# Patient Record
Sex: Female | Born: 1990 | Hispanic: Yes | Smoking: Never smoker
Health system: Southern US, Community
[De-identification: ages and names within clinical notes are randomized; demographics above are authoritative.]

## PROBLEM LIST (undated history)

## (undated) ENCOUNTER — Inpatient Hospital Stay (HOSPITAL_COMMUNITY): Payer: Self-pay

## (undated) ENCOUNTER — Inpatient Hospital Stay (HOSPITAL_COMMUNITY)

## (undated) DIAGNOSIS — R0989 Other specified symptoms and signs involving the circulatory and respiratory systems: Secondary | ICD-10-CM

## (undated) DIAGNOSIS — R112 Nausea with vomiting, unspecified: Secondary | ICD-10-CM

## (undated) DIAGNOSIS — IMO0002 Reserved for concepts with insufficient information to code with codable children: Secondary | ICD-10-CM

## (undated) DIAGNOSIS — Z9889 Other specified postprocedural states: Secondary | ICD-10-CM

## (undated) DIAGNOSIS — N390 Urinary tract infection, site not specified: Secondary | ICD-10-CM

## (undated) DIAGNOSIS — Z8781 Personal history of (healed) traumatic fracture: Secondary | ICD-10-CM

## (undated) DIAGNOSIS — H9190 Unspecified hearing loss, unspecified ear: Secondary | ICD-10-CM

## (undated) DIAGNOSIS — O039 Complete or unspecified spontaneous abortion without complication: Secondary | ICD-10-CM

## (undated) DIAGNOSIS — R229 Localized swelling, mass and lump, unspecified: Secondary | ICD-10-CM

## (undated) HISTORY — PX: TONSILLECTOMY: SUR1361

## (undated) HISTORY — PX: COCHLEAR IMPLANT: SUR684

## (undated) HISTORY — DX: Other specified symptoms and signs involving the circulatory and respiratory systems: R09.89

## (undated) HISTORY — PX: RHINOPLASTY: SUR1284

---
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
## 2011-09-30 ENCOUNTER — Encounter (HOSPITAL_COMMUNITY): Payer: Self-pay | Admitting: Emergency Medicine

## 2011-09-30 ENCOUNTER — Emergency Department (HOSPITAL_COMMUNITY)
Admission: EM | Admit: 2011-09-30 | Discharge: 2011-09-30 | Disposition: A | Discharge status: Home / Self Care | Attending: Emergency Medicine | Admitting: Emergency Medicine

## 2011-09-30 DIAGNOSIS — L0291 Cutaneous abscess, unspecified: Secondary | ICD-10-CM

## 2011-09-30 DIAGNOSIS — H9190 Unspecified hearing loss, unspecified ear: Secondary | ICD-10-CM | POA: Insufficient documentation

## 2011-09-30 DIAGNOSIS — L0390 Cellulitis, unspecified: Secondary | ICD-10-CM

## 2011-09-30 DIAGNOSIS — L02219 Cutaneous abscess of trunk, unspecified: Secondary | ICD-10-CM | POA: Insufficient documentation

## 2011-09-30 DIAGNOSIS — R109 Unspecified abdominal pain: Secondary | ICD-10-CM | POA: Insufficient documentation

## 2011-09-30 HISTORY — DX: Unspecified hearing loss, unspecified ear: H91.90

## 2011-09-30 MED ORDER — IBUPROFEN 200 MG PO TABS
400.0000 mg | ORAL_TABLET | Freq: Once | ORAL | Status: AC
  Administered 2011-09-30: 400 mg via ORAL
  Filled 2011-09-30: qty 2

## 2011-09-30 MED ORDER — BUPIVACAINE HCL (PF) 0.5 % IJ SOLN
10.0000 mL | Freq: Once | INTRAMUSCULAR | Status: AC
  Administered 2011-09-30: 10 mL
  Filled 2011-09-30: qty 30

## 2011-09-30 MED ORDER — SULFAMETHOXAZOLE-TMP DS 800-160 MG PO TABS
1.0000 | ORAL_TABLET | Freq: Once | ORAL | Status: AC
  Administered 2011-09-30: 1 via ORAL
  Filled 2011-09-30: qty 1

## 2011-09-30 MED ORDER — LORAZEPAM 1 MG PO TABS
1.0000 mg | ORAL_TABLET | Freq: Once | ORAL | Status: AC
Start: 2011-09-30 — End: 2011-09-30
  Administered 2011-09-30: 1 mg via ORAL
  Filled 2011-09-30: qty 1

## 2011-09-30 MED ORDER — SULFAMETHOXAZOLE-TRIMETHOPRIM 800-160 MG PO TABS: 1.0000 | ORAL_TABLET | Freq: Two times a day (BID) | ORAL | Status: AC

## 2011-09-30 MED ORDER — OXYCODONE-ACETAMINOPHEN 5-325 MG PO TABS
2.0000 | ORAL_TABLET | Freq: Once | ORAL | Status: AC
  Administered 2011-09-30: 2 via ORAL
  Filled 2011-09-30: qty 2

## 2011-09-30 NOTE — ED Notes (Signed)
Per pt, states has pimple on left flank area, tried to pop it and it got bigger-states pain radiating to left goin-cant bear weight on left side

## 2011-09-30 NOTE — Discharge Instructions (Signed)
Abscess An abscess (boil or furuncle) is an infected area that contains a collection of pus.  SYMPTOMS Signs and symptoms of an abscess include pain, tenderness, redness, or hardness. You may feel a moveable soft area under your skin. An abscess can occur anywhere in the body.  TREATMENT  A surgical cut (incision) may be made over your abscess to drain the pus. Gauze may be packed into the space or a drain may be looped through the abscess cavity (pocket). This provides a drain that will allow the cavity to heal from the inside outwards. The abscess may be painful for a few days, but should feel much better if it was drained.  Your abscess, if seen early, may not have localized and may not have been drained. If not, another appointment may be required if it does not get better on its own or with medications. HOME CARE INSTRUCTIONS   Only take over-the-counter or prescription medicines for pain, discomfort, or fever as directed by your caregiver.   Take your antibiotics as directed if they were prescribed. Finish them even if you start to feel better.   Keep the skin and clothes clean around your abscess.   If the abscess was drained, you will need to use gauze dressing to collect any draining pus. Dressings will typically need to be changed 3 or more times a day.   The infection may spread by skin contact with others. Avoid skin contact as much as possible.   Practice good hygiene. This includes regular hand washing, cover any draining skin lesions, and do not share personal care items.   If you participate in sports, do not share athletic equipment, towels, whirlpools, or personal care items. Shower after every practice or tournament.   If a draining area cannot be adequately covered:   Do not participate in sports.   Children should not participate in day care until the wound has healed or drainage stops.   If your caregiver has given you a follow-up appointment, it is very important  to keep that appointment. Not keeping the appointment could result in a much worse infection, chronic or permanent injury, pain, and disability. If there is any problem keeping the appointment, you must call back to this facility for assistance.  SEEK MEDICAL CARE IF:   You develop increased pain, swelling, redness, drainage, or bleeding in the wound site.   You develop signs of generalized infection including muscle aches, chills, fever, or a general ill feeling.   You have an oral temperature above 102 F (38.9 C).  MAKE SURE YOU:   Understand these instructions.   Will watch your condition.   Will get help right away if you are not doing well or get worse.  Document Released: 12/30/2004 Document Revised: 03/11/2011 Document Reviewed: 10/24/2007 [facility] Patient Information 2012 [place], [place].Cellulitis Cellulitis is an infection of the tissue under the skin. The infected area is usually red and tender. This is caused by germs. These germs enter the body through cuts or sores. This usually happens in the arms or lower legs. HOME CARE   Take your medicine as told. Finish it even if you start to feel better.   If the infection is on the arm or leg, keep it raised (elevated).   Use a warm cloth on the infected area several times a day.   See your doctor for a follow-up visit as told.  GET HELP RIGHT AWAY IF:   You are tired or confused.   You  throw up (vomit).   You have watery poop (diarrhea).   You feel ill and have muscle aches.   You have a fever.  MAKE SURE YOU:   Understand these instructions.   Will watch your condition.   Will get help right away if you are not doing well or get worse.  Document Released: 09/08/2007 Document Revised: 03/11/2011 Document Reviewed: 02/21/2009 [facility] Patient Information 2012 [place], [place].Cellulitis Cellulitis is an infection of the tissue under the skin. The infected area is usually red and tender. This is caused by germs.  These germs enter the body through cuts or sores. This usually happens in the arms or lower legs. HOME CARE   Take your medicine as told. Finish it even if you start to feel better.   If the infection is on the arm or leg, keep it raised (elevated).   Use a warm cloth on the infected area several times a day.   See your doctor for a follow-up visit as told.  GET HELP RIGHT AWAY IF:   You are tired or confused.   You throw up (vomit).   You have watery poop (diarrhea).   You feel ill and have muscle aches.   You have a fever.  MAKE SURE YOU:   Understand these instructions.   Will watch your condition.   Will get help right away if you are not doing well or get worse.  Document Released: 09/08/2007 Document Revised: 03/11/2011 Document Reviewed: 02/21/2009 [facility] Patient Information 2012 [place], [place].

## 2011-09-30 NOTE — ED Notes (Signed)
Patient with abcess to left side x 1 week. Redness with swelling and drainage noted. Patient tried to pop herself 1 week ago w/o relief

## 2011-09-30 NOTE — ED Provider Notes (Signed)
History    21yf with abscess. L flank area. Had small pimple in area several days ago which she popped. Since then has become increasingly bigger and more painful. Some pain in L groin area as well. No fever or chills. No n/v. No urinary complaints. No hx of recurrent skin infections. Deaf, otherwise no significant PMHx.   CSN: [id number]  Arrival date & time 09/30/11  1701   First MD Initiated Contact with Patient 09/30/11 1906      Chief Complaint  Patient presents with  . Abscess    (Consider location/radiation/quality/duration/timing/severity/associated sxs/prior treatment) HPI  Past Medical History  Diagnosis Date  . Deaf     Past Surgical History  Procedure Date  . Tonsillectomy   . Cochlear implant     No family history on file.  History  Substance Use Topics  . Smoking status: Never Smoker   . Smokeless tobacco: Not on file  . Alcohol Use: No    OB History    Grav Para Term Preterm Abortions TAB SAB Ect Mult Living                  Review of Systems   Review of symptoms negative unless otherwise noted in HPI.   Allergies  Review of patient's allergies indicates no known allergies.  Home Medications   Current Outpatient Rx  Name Route Sig Dispense Refill  . ACETAMINOPHEN 500 MG PO TABS Oral Take 1,000 mg by mouth every 6 (six) hours as needed. For pain.    Marland Kitchen AMOXICILLIN 500 MG PO CAPS Oral Take 1,000 mg by mouth once.    . AMOXICILLIN 500 MG PO CAPS Oral Take 500 mg by mouth once.    . IBUPROFEN 200 MG PO TABS Oral Take 600 mg by mouth every 8 (eight) hours as needed. For pain.      BP 124/80  Pulse 79  Temp 97.8 F (36.6 C) (Oral)  Resp 18  Ht 5\' 4"  (1.626 m)  Wt 153 lb (69.4 kg)  BMI 26.26 kg/m2  SpO2 100%  LMP 09/16/2011  Physical Exam  Nursing note and vitals reviewed. Constitutional: She appears well-developed and well-nourished. No distress.  HENT:  Head: Normocephalic and atraumatic.  Eyes: Conjunctivae are normal. Right eye  exhibits no discharge. Left eye exhibits no discharge.  Neck: Neck supple.  Cardiovascular: Normal rate, regular rhythm and normal heart sounds.  Exam reveals no gallop and no friction rub.   No murmur heard. Pulmonary/Chest: Effort normal and breath sounds normal. No respiratory distress.  Abdominal: Soft. She exhibits no distension. There is no tenderness.  Musculoskeletal: She exhibits no edema and no tenderness.  Neurological: She is alert.  Skin: Skin is warm and dry.       Lesion L flank consistent with abscess. Mild surrounding cellulitis. Fluid collection confirmed on bedside US. Small L inguinal lymph nodes.  Psychiatric: She has a normal mood and affect. Her behavior is normal. Thought content normal.    ED Course  Procedures (including critical care time)  INCISION AND DRAINAGE Performed by: Raeford Razor Consent: Verbal consent obtained. Risks and benefits: risks, benefits and alternatives were discussed Type: abscess  Body area: L flank  Anesthesia: local infiltration  Local anesthetic: Bupivacaine 0.5% without  epinephrine  Anesthetic total: 3 ml  Complexity: complex Blunt dissection to break up loculations  Drainage: purulent  Drainage amount: moderate amount  Packing material: 1/4 in iodoform gauze  Patient tolerance: Patient tolerated the procedure well with no immediate  complications.    Labs Reviewed - No data to display No results found.   1. Abscess   2. Cellulitis       MDM  21yF with abscess L flank with mild surrounding cellulitis. Not systemically ill. No hx of diabetes or immunosuppression. Abscess I&D. Antibiotics for mild cellulitis. Return precautions were discussed. Continued wound care was discussed with patient and her mother. Outpatient followup otherwise.        Raeford Razor, MD 10/04/11 [phone]

## 2012-03-17 LAB — OB RESULTS CONSOLE GC/CHLAMYDIA: Gonorrhea: NEGATIVE

## 2012-03-17 LAB — OB RESULTS CONSOLE HEPATITIS B SURFACE ANTIGEN: Hepatitis B Surface Ag: NEGATIVE

## 2012-03-17 LAB — OB RESULTS CONSOLE HIV ANTIBODY (ROUTINE TESTING): HIV: NONREACTIVE

## 2012-04-05 NOTE — L&D Delivery Note (Signed)
Delivery Note At 7:19 AM a viable and healthy female was delivered via Vaginal, Spontaneous Delivery (Presentation: Right Occiput Anterior).  APGAR: 8, 9; weight .   Placenta status: Intact, Spontaneous.  Cord: 3 vessels with the following complications: None.  Cord pH: n/a  Anesthesia: Epidural  Episiotomy: None Lacerations: 1st degree Suture Repair: 3.0 monocryl Est. Blood Loss (mL): 300  Mom to postpartum.  Baby to nursery-stable.  [facility] 09/23/2012, 7:50 AM

## 2012-04-14 LAB — AFP, QUAD SCREEN: Quad Risk Down Syndrome: NEGATIVE

## 2012-04-14 LAB — CULTURE, OB URINE: Urine Culture, OB: NO GROWTH

## 2012-04-16 ENCOUNTER — Emergency Department (HOSPITAL_COMMUNITY)
Admission: EM | Admit: 2012-04-16 | Discharge: 2012-04-16 | Disposition: A | Discharge status: Home / Self Care | Payer: Medicaid Other | Attending: Emergency Medicine | Admitting: Emergency Medicine

## 2012-04-16 ENCOUNTER — Encounter (HOSPITAL_COMMUNITY): Payer: Self-pay | Admitting: Emergency Medicine

## 2012-04-16 DIAGNOSIS — O9989 Other specified diseases and conditions complicating pregnancy, childbirth and the puerperium: Secondary | ICD-10-CM | POA: Insufficient documentation

## 2012-04-16 DIAGNOSIS — Z8669 Personal history of other diseases of the nervous system and sense organs: Secondary | ICD-10-CM | POA: Insufficient documentation

## 2012-04-16 DIAGNOSIS — R51 Headache: Secondary | ICD-10-CM | POA: Insufficient documentation

## 2012-04-16 DIAGNOSIS — Z79899 Other long term (current) drug therapy: Secondary | ICD-10-CM | POA: Insufficient documentation

## 2012-04-16 NOTE — ED Provider Notes (Signed)
History     CSN: [id number]  Arrival date & time 04/16/12  1149   First MD Initiated Contact with Patient 04/16/12 1213      Chief Complaint  Patient presents with  . Headache    (Consider location/radiation/quality/duration/timing/severity/associated sxs/prior treatment) Patient is a 22 y.o. female presenting with headaches. The history is provided by the patient.  Headache  This is a new problem. The current episode started more than 2 days ago. The problem occurs constantly. The headache is associated with bright light (She states being in class and walking also make the headache worse. ). Pertinent negatives include no fever, no nausea and no vomiting. Associated symptoms comments: Parietal and frontal headache off and on for one week. She reports taking Tylenol without relief. No nausea or vomiting. She is 5 months pregnant but states no abdominal pain, vaginal bleeding or vomiting. She receives regular prenatal care and has no concerns about the pregnancy today. She does not have a history of headache. It begins gradually, and prompted evaluation because is has persisted for one week. .    Past Medical History  Diagnosis Date  . Deaf     Past Surgical History  Procedure Date  . Tonsillectomy   . Cochlear implant     History reviewed. No pertinent family history.  History  Substance Use Topics  . Smoking status: Never Smoker   . Smokeless tobacco: Not on file  . Alcohol Use: No    OB History    Grav Para Term Preterm Abortions TAB SAB Ect Mult Living   1               Review of Systems  Constitutional: Negative for fever.  HENT: Negative for congestion, sore throat and sinus pressure.   Gastrointestinal: Negative for nausea, vomiting and abdominal pain.  Genitourinary: Negative for vaginal bleeding.  Neurological: Positive for headaches.  Psychiatric/Behavioral: Negative for confusion.    Allergies  Review of patient's allergies indicates no known  allergies.  Home Medications   Current Outpatient Rx  Name  Route  Sig  Dispense  Refill  . ACETAMINOPHEN 500 MG PO TABS   Oral   Take 1,000 mg by mouth every 6 (six) hours as needed. For pain.         Marland Kitchen PRENATAL 27-0.8 MG PO TABS   Oral   Take 1 tablet by mouth daily.           BP 107/66  Pulse 84  Temp 98 F (36.7 C) (Oral)  Resp 16  SpO2 99%  LMP 12/09/2011  Physical Exam  Constitutional: She is oriented to person, place, and time. She appears well-developed and well-nourished.  HENT:  Head: Normocephalic.       Ethmoid sinus tenderness to palpation that reproduces headache.  Eyes: Pupils are equal, round, and reactive to light.  Neck: Normal range of motion. Neck supple.  Cardiovascular: Normal rate and regular rhythm.   Pulmonary/Chest: Effort normal and breath sounds normal.  Abdominal: Soft. Bowel sounds are normal. There is no tenderness. There is no rebound and no guarding.  Musculoskeletal: Normal range of motion.  Neurological: She is alert and oriented to person, place, and time. She has normal strength and normal reflexes. She displays normal reflexes. No sensory deficit. She displays a negative Romberg sign. Coordination normal.  Skin: Skin is warm and dry. No rash noted.  Psychiatric: She has a normal mood and affect.    ED Course  Procedures (including critical care  time)  Labs Reviewed - No data to display No results found.   No diagnosis found.  1. Sinus headache 2. Normal pregnancy.  MDM  Benign sinus headache. REcommend nasal spray, Sudafed, antihistamines as symptomatic relief.         Arnoldo Hooker, PA-C 04/16/12 1301

## 2012-04-16 NOTE — ED Notes (Signed)
Pt is [redacted] weeks pregnant.  Pt c/o headache x 1 week.  Denies any complications with pregnancy.

## 2012-04-20 NOTE — ED Provider Notes (Signed)
Medical screening examination/treatment/procedure(s) were performed by non-physician practitioner and as supervising physician I was immediately available for consultation/collaboration.   Mariadejesus Cade, MD 04/20/12 1400 

## 2012-06-21 LAB — GLUCOSE, 1 HOUR: Glucose, 1 hour: 137

## 2012-06-28 LAB — GLUCOSE TOLERANCE, 3 HOURS
Glucose, GTT - 1 Hour: 129 mg/dL (ref ?–200)
Glucose, GTT - 2 Hour: 115 mg/dL (ref ?–140)
Glucose, GTT - Fasting: 73 mg/dL — AB (ref 80–110)

## 2012-07-24 ENCOUNTER — Encounter: Admitting: Obstetrics & Gynecology

## 2012-07-27 ENCOUNTER — Encounter: Admitting: Family Medicine

## 2012-08-04 ENCOUNTER — Encounter (HOSPITAL_COMMUNITY): Payer: Self-pay

## 2012-08-04 ENCOUNTER — Inpatient Hospital Stay (HOSPITAL_COMMUNITY)
Admission: AD | Admit: 2012-08-04 | Discharge: 2012-08-05 | Disposition: A | Discharge status: Home / Self Care | Source: Ambulatory Visit | Attending: Obstetrics & Gynecology | Admitting: Obstetrics & Gynecology

## 2012-08-04 DIAGNOSIS — M545 Low back pain, unspecified: Secondary | ICD-10-CM | POA: Insufficient documentation

## 2012-08-04 DIAGNOSIS — O4700 False labor before 37 completed weeks of gestation, unspecified trimester: Secondary | ICD-10-CM | POA: Insufficient documentation

## 2012-08-04 DIAGNOSIS — O479 False labor, unspecified: Secondary | ICD-10-CM

## 2012-08-04 LAB — URINALYSIS, ROUTINE W REFLEX MICROSCOPIC
Bilirubin Urine: NEGATIVE
Glucose, UA: NEGATIVE mg/dL
Hgb urine dipstick: NEGATIVE
Ketones, ur: NEGATIVE mg/dL
Nitrite: NEGATIVE
pH: 6 (ref 5.0–8.0)

## 2012-08-04 MED ORDER — CYCLOBENZAPRINE HCL 10 MG PO TABS
10.0000 mg | ORAL_TABLET | Freq: Once | ORAL | Status: AC
  Administered 2012-08-04: 10 mg via ORAL
  Filled 2012-08-04: qty 1

## 2012-08-04 NOTE — MAU Note (Signed)
Patient reports having contractions since this afternoon about 20 minutes apart and back pain.

## 2012-08-04 NOTE — MAU Provider Note (Signed)
  History     CSN: [id number]  Arrival date and time: 08/04/12 2247   None     Chief Complaint  Patient presents with  . Contractions  . Back Pain   HPI  Diana Torres is a 22 y.o. G1P0 at [redacted]w[redacted]d who presents today with contractions and lower back pain. She states she has had about 4-5 contractions since about 1000 am today. She denies lof or vb and confirms FM.  Past Medical History  Diagnosis Date  . Deaf     Past Surgical History  Procedure Laterality Date  . Tonsillectomy    . Cochlear implant      History reviewed. No pertinent family history.  History  Substance Use Topics  . Smoking status: Never Smoker   . Smokeless tobacco: Not on file  . Alcohol Use: No    Allergies: No Known Allergies  Prescriptions prior to admission  Medication Sig Dispense Refill  . acetaminophen (TYLENOL) 500 MG tablet Take 1,000 mg by mouth every 6 (six) hours as needed. For pain.      . Prenatal Vit-Fe Fumarate-FA (MULTIVITAMIN-PRENATAL) 27-0.8 MG TABS Take 1 tablet by mouth daily.        Review of Systems  Constitutional: Negative for fever.  Eyes: Negative for blurred vision.  Respiratory: Negative for shortness of breath.   Cardiovascular: Negative for chest pain.  Gastrointestinal: Negative for nausea, vomiting, diarrhea and constipation.  Genitourinary: Negative for dysuria, urgency and frequency.  Musculoskeletal: Negative for myalgias.  Neurological: Negative for dizziness and headaches.   Physical Exam   Blood pressure 118/77, pulse 94, temperature 98.4 F (36.9 C), temperature source Oral, resp. rate 18, last menstrual period 12/02/2011.  Physical Exam  Nursing note and vitals reviewed. Constitutional: She is oriented to person, place, and time. She appears well-developed and well-nourished. No distress.  Cardiovascular: Normal rate.   Respiratory: Effort normal.  GI: Soft.  Genitourinary:   Cervix: closed/thick/0  FHT: 130, moderate with 15x15 accels, no  decels Toco: no UCs  Neurological: She is alert and oriented to person, place, and time.  Skin: Skin is warm and dry.  Psychiatric: She has a normal mood and affect.    MAU Course  Procedures  Results for orders placed during the hospital encounter of 08/04/12 (from the past 24 hour(s))  URINALYSIS, ROUTINE W REFLEX MICROSCOPIC     Status: Abnormal   Collection Time    08/04/12 10:53 PM      Result Value Range   Color, Urine YELLOW  YELLOW   APPearance CLEAR  CLEAR   Specific Gravity, Urine <1.005 (*) 1.005 - 1.030   pH 6.0  5.0 - 8.0   Glucose, UA NEGATIVE  NEGATIVE mg/dL   Hgb urine dipstick NEGATIVE  NEGATIVE   Bilirubin Urine NEGATIVE  NEGATIVE   Ketones, ur NEGATIVE  NEGATIVE mg/dL   Protein, ur NEGATIVE  NEGATIVE mg/dL   Urobilinogen, UA 0.2  0.0 - 1.0 mg/dL   Nitrite NEGATIVE  NEGATIVE   Leukocytes, UA NEGATIVE  NEGATIVE     Assessment and Plan   1. Braxton Hicks contractions    Discussed increased frequency of BH ctx at the end of pregnancy PTL danger signs reviewed FU with the clinic as scheduled  Diana Torres 08/04/2012, 11:32 PM

## 2012-08-14 ENCOUNTER — Ambulatory Visit (INDEPENDENT_AMBULATORY_CARE_PROVIDER_SITE_OTHER): Admitting: Obstetrics and Gynecology

## 2012-08-14 ENCOUNTER — Encounter: Admitting: Obstetrics and Gynecology

## 2012-08-14 ENCOUNTER — Encounter: Payer: Self-pay | Admitting: Obstetrics and Gynecology

## 2012-08-14 ENCOUNTER — Other Ambulatory Visit: Payer: Self-pay | Admitting: Obstetrics and Gynecology

## 2012-08-14 VITALS — BP 120/84 | Temp 96.6°F | Wt 183.1 lb

## 2012-08-14 DIAGNOSIS — Z23 Encounter for immunization: Secondary | ICD-10-CM

## 2012-08-14 DIAGNOSIS — O0930 Supervision of pregnancy with insufficient antenatal care, unspecified trimester: Secondary | ICD-10-CM | POA: Insufficient documentation

## 2012-08-14 DIAGNOSIS — H9190 Unspecified hearing loss, unspecified ear: Secondary | ICD-10-CM

## 2012-08-14 DIAGNOSIS — Z3403 Encounter for supervision of normal first pregnancy, third trimester: Secondary | ICD-10-CM

## 2012-08-14 DIAGNOSIS — IMO0001 Reserved for inherently not codable concepts without codable children: Secondary | ICD-10-CM | POA: Insufficient documentation

## 2012-08-14 DIAGNOSIS — Z3400 Encounter for supervision of normal first pregnancy, unspecified trimester: Secondary | ICD-10-CM | POA: Insufficient documentation

## 2012-08-14 LAB — OB RESULTS CONSOLE GC/CHLAMYDIA
Chlamydia: NEGATIVE
Gonorrhea: NEGATIVE

## 2012-08-14 LAB — POCT URINALYSIS DIP (DEVICE)
Bilirubin Urine: NEGATIVE
Glucose, UA: 100 mg/dL — AB
Hgb urine dipstick: NEGATIVE
Nitrite: NEGATIVE
Specific Gravity, Urine: >=1.03 (ref 1.005–1.030)
Urobilinogen, UA: 0.2 mg/dL (ref 0.0–1.0)

## 2012-08-14 LAB — OB RESULTS CONSOLE GBS: GBS: NEGATIVE

## 2012-08-14 MED ORDER — TETANUS-DIPHTH-ACELL PERTUSSIS 5-2.5-18.5 LF-MCG/0.5 IM SUSP
0.5000 mL | Freq: Once | INTRAMUSCULAR | Status: AC
  Administered 2012-08-14: 0.5 mL via INTRAMUSCULAR

## 2012-08-14 MED ORDER — INFLUENZA VIRUS VACC SPLIT PF IM SUSP
0.5000 mL | Freq: Once | INTRAMUSCULAR | Status: AC
  Administered 2012-08-14: 0.5 mL via INTRAMUSCULAR

## 2012-08-14 NOTE — Addendum Note (Signed)
Addended by: Toula Moos on: 08/14/2012 03:17 PM   Modules accepted: Orders

## 2012-08-14 NOTE — Progress Notes (Signed)
Pulse- 103  Edema-feet  Pain/pressure- lower abd New ob packet given Flu and tdap vaccine consented Weight gain 25-35lb

## 2012-08-14 NOTE — Patient Instructions (Signed)
Pregnancy - Third Trimester The third trimester of pregnancy (the last 3 months) is a period of the most rapid growth for you and your baby. The baby approaches a length of 20 inches and a weight of 6 to 10 pounds. The baby is adding on fat and getting ready for life outside your body. While inside, babies have periods of sleeping and waking, suck their thumbs, and hiccups. You can often feel small contractions of the uterus. This is false labor. It is also called Braxton-Hicks contractions. This is like a practice for labor. The usual problems in this stage of pregnancy include more difficulty breathing, swelling of the hands and feet from water retention, and having to urinate more often because of the uterus and baby pressing on your bladder.  PRENATAL EXAMS  Blood work may continue to be done during prenatal exams. These tests are done to check on your health and the probable health of your baby. Blood work is used to follow your blood levels (hemoglobin). Anemia (low hemoglobin) is common during pregnancy. Iron and vitamins are given to help prevent this. You may also continue to be checked for diabetes. Some of the past blood tests may be done again.  The size of the uterus is measured during each visit. This makes sure your baby is growing properly according to your pregnancy dates.  Your blood pressure is checked every prenatal visit. This is to make sure you are not getting toxemia.  Your urine is checked every prenatal visit for infection, diabetes and protein.  Your weight is checked at each visit. This is done to make sure gains are happening at the suggested rate and that you and your baby are growing normally.  Sometimes, an ultrasound is performed to confirm the position and the proper growth and development of the baby. This is a test done that bounces harmless sound waves off the baby so your caregiver can more accurately determine due dates.  Discuss the type of pain medication  and anesthesia you will have during your labor and delivery.  Discuss the possibility and anesthesia if a Cesarean Section might be necessary.  Inform your caregiver if there is any mental or physical violence at home. Sometimes, a specialized non-stress test, contraction stress test and biophysical profile are done to make sure the baby is not having a problem. Checking the amniotic fluid surrounding the baby is called an amniocentesis. The amniotic fluid is removed by sticking a needle into the belly (abdomen). This is sometimes done near the end of pregnancy if an early delivery is required. In this case, it is done to help make sure the baby's lungs are mature enough for the baby to live outside of the womb. If the lungs are not mature and it is unsafe to deliver the baby, an injection of cortisone medication is given to the mother 1 to 2 days before the delivery. This helps the baby's lungs mature and makes it safer to deliver the baby. CHANGES OCCURING IN THE THIRD TRIMESTER OF PREGNANCY Your body goes through many changes during pregnancy. They vary from person to person. Talk to your caregiver about changes you notice and are concerned about.  During the last trimester, you have probably had an increase in your appetite. It is normal to have cravings for certain foods. This varies from person to person and pregnancy to pregnancy.  You may begin to get stretch marks on your hips, abdomen, and breasts. These are normal changes in the body   during pregnancy. There are no exercises or medications to take which prevent this change.  Constipation may be treated with a stool softener or adding bulk to your diet. Drinking lots of fluids, fiber in vegetables, fruits, and whole grains are helpful.  Exercising is also helpful. If you have been very active up until your pregnancy, most of these activities can be continued during your pregnancy. If you have been less active, it is helpful to start an  exercise program such as walking. Consult your caregiver before starting exercise programs.  Avoid all smoking, alcohol, un-prescribed drugs, herbs and "street drugs" during your pregnancy. These chemicals affect the formation and growth of the baby. Avoid chemicals throughout the pregnancy to ensure the delivery of a healthy infant.  Backache, varicose veins and hemorrhoids may develop or get worse.  You will tire more easily in the third trimester, which is normal.  The baby's movements may be stronger and more often.  You may become short of breath easily.  Your belly button may stick out.  A yellow discharge may leak from your breasts called colostrum.  You may have a bloody mucus discharge. This usually occurs a few days to a week before labor begins. HOME CARE INSTRUCTIONS   Keep your caregiver's appointments. Follow your caregiver's instructions regarding medication use, exercise, and diet.  During pregnancy, you are providing food for you and your baby. Continue to eat regular, well-balanced meals. Choose foods such as meat, fish, milk and other low fat dairy products, vegetables, fruits, and whole-grain breads and cereals. Your caregiver will tell you of the ideal weight gain.  A physical sexual relationship may be continued throughout pregnancy if there are no other problems such as early (premature) leaking of amniotic fluid from the membranes, vaginal bleeding, or belly (abdominal) pain.  Exercise regularly if there are no restrictions. Check with your caregiver if you are unsure of the safety of your exercises. Greater weight gain will occur in the last 2 trimesters of pregnancy. Exercising helps:  Control your weight.  Get you in shape for labor and delivery.  You lose weight after you deliver.  Rest a lot with legs elevated, or as needed for leg cramps or low back pain.  Wear a good support or jogging bra for breast tenderness during pregnancy. This may help if worn  during sleep. Pads or tissues may be used in the bra if you are leaking colostrum.  Do not use hot tubs, steam rooms, or saunas.  Wear your seat belt when driving. This protects you and your baby if you are in an accident.  Avoid raw meat, cat litter boxes and soil used by cats. These carry germs that can cause birth defects in the baby.  It is easier to loose urine during pregnancy. Tightening up and strengthening the pelvic muscles will help with this problem. You can practice stopping your urination while you are going to the bathroom. These are the same muscles you need to strengthen. It is also the muscles you would use if you were trying to stop from passing gas. You can practice tightening these muscles up 10 times a set and repeating this about 3 times per day. Once you know what muscles to tighten up, do not perform these exercises during urination. It is more likely to cause an infection by backing up the urine.  Ask for help if you have financial, counseling or nutritional needs during pregnancy. Your caregiver will be able to offer counseling for these   needs as well as refer you for other special needs.  Make a list of emergency phone numbers and have them available.  Plan on getting help from family or friends when you go home from the hospital.  Make a trial run to the hospital.  Take prenatal classes with the father to understand, practice and ask questions about the labor and delivery.  Prepare the baby's room/nursery.  Do not travel out of the city unless it is absolutely necessary and with the advice of your caregiver.  Wear only low or no heal shoes to have better balance and prevent falling. MEDICATIONS AND DRUG USE IN PREGNANCY  Take prenatal vitamins as directed. The vitamin should contain 1 milligram of folic acid. Keep all vitamins out of reach of children. Only a couple vitamins or tablets containing iron may be fatal to a baby or young child when  ingested.  Avoid use of all medications, including herbs, over-the-counter medications, not prescribed or suggested by your caregiver. Only take over-the-counter or prescription medicines for pain, discomfort, or fever as directed by your caregiver. Do not use aspirin, ibuprofen (Motrin, Advil, Nuprin) or naproxen (Aleve) unless OK'd by your caregiver.  Let your caregiver also know about herbs you may be using.  Alcohol is related to a number of birth defects. This includes fetal alcohol syndrome. All alcohol, in any form, should be avoided completely. Smoking will cause low birth rate and premature babies.  Street/illegal drugs are very harmful to the baby. They are absolutely forbidden. A baby born to an addicted mother will be addicted at birth. The baby will go through the same withdrawal an adult does. SEEK MEDICAL CARE IF: You have any concerns or worries during your pregnancy. It is better to call with your questions if you feel they cannot wait, rather than worry about them. DECISIONS ABOUT CIRCUMCISION You may or may not know the sex of your baby. If you know your baby is a boy, it may be time to think about circumcision. Circumcision is the removal of the foreskin of the penis. This is the skin that covers the sensitive end of the penis. There is no proven medical need for this. Often this decision is made on what is popular at the time or based upon religious beliefs and social issues. You can discuss these issues with your caregiver or pediatrician. SEEK IMMEDIATE MEDICAL CARE IF:   An unexplained oral temperature above 100.4 F (38 C) develops, or as your caregiver suggests.  You have leaking of fluid from the vagina (birth canal). If leaking membranes are suspected, take your temperature and tell your caregiver of this when you call.  There is vaginal spotting, bleeding or passing clots. Tell your caregiver of the amount and how many pads are used.  You develop a bad smelling  vaginal discharge with a change in the color from clear to white.  You develop vomiting that lasts more than 24 hours.  You develop chills or fever.  You develop shortness of breath.  You develop burning on urination.  You loose more than 2 pounds of weight or gain more than 2 pounds of weight or as suggested by your caregiver.  You notice sudden swelling of your face, hands, and feet or legs.  You develop belly (abdominal) pain. Round ligament discomfort is a common non-cancerous (benign) cause of abdominal pain in pregnancy. Your caregiver still must evaluate you.  You develop a severe headache that does not go away.  You develop visual   problems, blurred or double vision.  If you have not felt your baby move for more than 1 hour. If you think the baby is not moving as much as usual, eat something with sugar in it and lie down on your left side for an hour. The baby should move at least 4 to 5 times per hour. Call right away if your baby moves less than that.  You fall, are in a car accident or any kind of trauma.  There is mental or physical violence at home. Document Released: 03/16/2001 Document Revised: 06/14/2011 Document Reviewed: 09/18/2008 [employer] Patient Information 2013 [employer].  Contraception Choices Contraception (birth control) is the use of any methods or devices to prevent pregnancy. Below are some methods to help avoid pregnancy. HORMONAL METHODS   Contraceptive implant. This is a thin, plastic tube containing progesterone hormone. It does not contain estrogen hormone. Your caregiver inserts the tube in the inner part of the upper arm. The tube can remain in place for up to 3 years. After 3 years, the implant must be removed. The implant prevents the ovaries from releasing an egg (ovulation), thickens the cervical mucus which prevents sperm from entering the uterus, and thins the lining of the inside of the uterus.  Progesterone-only injections. These  injections are given every 3 months by your caregiver to prevent pregnancy. This synthetic progesterone hormone stops the ovaries from releasing eggs. It also thickens cervical mucus and changes the uterine lining. This makes it harder for sperm to survive in the uterus.  Birth control pills. These pills contain estrogen and progesterone hormone. They work by stopping the egg from forming in the ovary (ovulation). Birth control pills are prescribed by a caregiver.Birth control pills can also be used to treat heavy periods.  Minipill. This type of birth control pill contains only the progesterone hormone. They are taken every day of each month and must be prescribed by your caregiver.  Birth control patch. The patch contains hormones similar to those in birth control pills. It must be changed once a week and is prescribed by a caregiver.  Vaginal ring. The ring contains hormones similar to those in birth control pills. It is left in the vagina for 3 weeks, removed for 1 week, and then a new one is put back in place. The patient must be comfortable inserting and removing the ring from the vagina.A caregiver's prescription is necessary.  Emergency contraception. Emergency contraceptives prevent pregnancy after unprotected sexual intercourse. This pill can be taken right after sex or up to 5 days after unprotected sex. It is most effective the sooner you take the pills after having sexual intercourse. Emergency contraceptive pills are available without a prescription. Check with your pharmacist. Do not use emergency contraception as your only form of birth control. BARRIER METHODS   Female condom. This is a thin sheath (latex or rubber) that is worn over the penis during sexual intercourse. It can be used with spermicide to increase effectiveness.  Female condom. This is a soft, loose-fitting sheath that is put into the vagina before sexual intercourse.  Diaphragm. This is a soft, latex, dome-shaped  barrier that must be fitted by a caregiver. It is inserted into the vagina, along with a spermicidal jelly. It is inserted before intercourse. The diaphragm should be left in the vagina for 6 to 8 hours after intercourse.  Cervical cap. This is a round, soft, latex or plastic cup that fits over the cervix and must be fitted by a   caregiver. The cap can be left in place for up to 48 hours after intercourse.  Sponge. This is a soft, circular piece of polyurethane foam. The sponge has spermicide in it. It is inserted into the vagina after wetting it and before sexual intercourse.  Spermicides. These are chemicals that kill or block sperm from entering the cervix and uterus. They come in the form of creams, jellies, suppositories, foam, or tablets. They do not require a prescription. They are inserted into the vagina with an applicator before having sexual intercourse. The process must be repeated every time you have sexual intercourse. INTRAUTERINE CONTRACEPTION  Intrauterine device (IUD). This is a T-shaped device that is put in a woman's uterus during a menstrual period to prevent pregnancy. There are 2 types:  Copper IUD. This type of IUD is wrapped in copper wire and is placed inside the uterus. Copper makes the uterus and fallopian tubes produce a fluid that kills sperm. It can stay in place for 10 years.  Hormone IUD. This type of IUD contains the hormone progestin (synthetic progesterone). The hormone thickens the cervical mucus and prevents sperm from entering the uterus, and it also thins the uterine lining to prevent implantation of a fertilized egg. The hormone can weaken or kill the sperm that get into the uterus. It can stay in place for 5 years. PERMANENT METHODS OF CONTRACEPTION  Female tubal ligation. This is when the woman's fallopian tubes are surgically sealed, tied, or blocked to prevent the egg from traveling to the uterus.  Female sterilization. This is when the female has the tubes  that carry sperm tied off (vasectomy).This blocks sperm from entering the vagina during sexual intercourse. After the procedure, the man can still ejaculate fluid (semen). NATURAL PLANNING METHODS  Natural family planning. This is not having sexual intercourse or using a barrier method (condom, diaphragm, cervical cap) on days the woman could become pregnant.  Calendar method. This is keeping track of the length of each menstrual cycle and identifying when you are fertile.  Ovulation method. This is avoiding sexual intercourse during ovulation.  Symptothermal method. This is avoiding sexual intercourse during ovulation, using a thermometer and ovulation symptoms.  Post-ovulation method. This is timing sexual intercourse after you have ovulated. Regardless of which type or method of contraception you choose, it is important that you use condoms to protect against the transmission of sexually transmitted diseases (STDs). Talk with your caregiver about which form of contraception is most appropriate for you. Document Released: 03/22/2005 Document Revised: 06/14/2011 Document Reviewed: 07/29/2010 [employer] Patient Information 2013 [employer].  Breastfeeding Deciding to breastfeed is one of the best choices you can make for you and your baby. The information that follows gives a brief overview of the benefits of breastfeeding as well as common topics surrounding breastfeeding. BENEFITS OF BREASTFEEDING For the baby  The first milk (colostrum) helps the baby's digestive system function better.   There are antibodies in the mother's milk that help the baby fight off infections.   The baby has a lower incidence of asthma, allergies, and sudden infant death syndrome (SIDS).   The nutrients in breast milk are better for the baby than infant formulas, and breast milk helps the baby's brain grow better.   Babies who breastfeed have less gas, colic, and constipation.  For the  mother  Breastfeeding helps develop a very special bond between the mother and her baby.   Breastfeeding is convenient, always available at the correct temperature, and costs nothing.     Breastfeeding burns calories in the mother and helps her lose weight that was gained during pregnancy.   Breastfeeding makes the uterus contract back down to normal size faster and slows bleeding following delivery.   Breastfeeding mothers have a lower risk of developing breast cancer.  BREASTFEEDING FREQUENCY  A healthy, full-term baby may breastfeed as often as every hour or space his or her feedings to every 3 hours.   Watch your baby for signs of hunger. Nurse your baby if he or she shows signs of hunger. How often you nurse will vary from baby to baby.   Nurse as often as the baby requests, or when you feel the need to reduce the fullness of your breasts.   Awaken the baby if it has been 3 4 hours since the last feeding.   Frequent feeding will help the mother make more milk and will help prevent problems, such as sore nipples and engorgement of the breasts.  BABY'S POSITION AT THE BREAST  Whether lying down or sitting, be sure that the baby's tummy is facing your tummy.   Support the breast with 4 fingers underneath the breast and the thumb above. Make sure your fingers are well away from the nipple and baby's mouth.   Stroke the baby's lips gently with your finger or nipple.   When the baby's mouth is open wide enough, place all of your nipple and as much of the areola as possible into your baby's mouth.   Pull the baby in close so the tip of the nose and the baby's cheeks touch the breast during the feeding.  FEEDINGS AND SUCTION  The length of each feeding varies from baby to baby and from feeding to feeding.   The baby must suck about 2 3 minutes for your milk to get to him or her. This is called a "let down." For this reason, allow the baby to feed on each breast as long  as he or she wants. Your baby will end the feeding when he or she has received the right balance of nutrients.   To break the suction, put your finger into the corner of the baby's mouth and slide it between his or her gums before removing your breast from his or her mouth. This will help prevent sore nipples.  HOW TO TELL WHETHER YOUR BABY IS GETTING ENOUGH BREAST MILK. Wondering whether or not your baby is getting enough milk is a common concern among mothers. You can be assured that your baby is getting enough milk if:   Your baby is actively sucking and you hear swallowing.   Your baby seems relaxed and satisfied after a feeding.   Your baby nurses at least 8 12 times in a 24 hour time period. Nurse your baby until he or she unlatches or falls asleep at the first breast (at least 10 20 minutes), then offer the second side.   Your baby is wetting 5 6 disposable diapers (6 8 cloth diapers) in a 24 hour period by 5 6 days of age.   Your baby is having at least 3 4 stools every 24 hours for the first 6 weeks. The stool should be soft and yellow.   Your baby should gain 4 7 ounces per week after he or she is 4 days old.   Your breasts feel softer after nursing.  REDUCING BREAST ENGORGEMENT  In the first week after your baby is born, you may experience signs of breast engorgement. When breasts   are engorged, they feel heavy, warm, full, and may be tender to the touch. You can reduce engorgement if you:   Nurse frequently, every 2 3 hours. Mothers who breastfeed early and often have fewer problems with engorgement.   Place light ice packs on your breasts for 10 20 minutes between feedings. This reduces swelling. Wrap the ice packs in a lightweight towel to protect your skin. Bags of frozen vegetables work well for this purpose.   Take a warm shower or apply warm, moist heat to your breast for 5 10 minutes just before each feeding. This increases circulation and helps the milk flow.    Gently massage your breast before and during the feeding. Using your finger tips, massage from the chest wall towards your nipple in a circular motion.   Make sure that the baby empties at least one breast at every feeding before switching sides.   Use a breast pump to empty the breasts if your baby is sleepy or not nursing well. You may also want to pump if you are returning to work oryou feel you are getting engorged.   Avoid bottle feeds, pacifiers, or supplemental feedings of water or juice in place of breastfeeding. Breast milk is all the food your baby needs. It is not necessary for your baby to have water or formula. In fact, to help your breasts make more milk, it is best not to give your baby supplemental feedings during the early weeks.   Be sure the baby is latched on and positioned properly while breastfeeding.   Wear a supportive bra, avoiding underwire styles.   Eat a balanced diet with enough fluids.   Rest often, relax, and take your prenatal vitamins to prevent fatigue, stress, and anemia.  If you follow these suggestions, your engorgement should improve in 24 48 hours. If you are still experiencing difficulty, call your lactation consultant or caregiver.  CARING FOR YOURSELF Take care of your breasts  Bathe or shower daily.   Avoid using soap on your nipples.   Start feedings on your left breast at one feeding and on your right breast at the next feeding.   You will notice an increase in your milk supply 2 5 days after delivery. You may feel some discomfort from engorgement, which makes your breasts very firm and often tender. Engorgement "peaks" out within 24 48 hours. In the meantime, apply warm moist towels to your breasts for 5 10 minutes before feeding. Gentle massage and expression of some milk before feeding will soften your breasts, making it easier for your baby to latch on.   Wear a well-fitting nursing bra, and air dry your nipples for a 3  4minutes after each feeding.   Only use cotton bra pads.   Only use pure lanolin on your nipples after nursing. You do not need to wash it off before feeding the baby again. Another option is to express a few drops of breast milk and gently massage it into your nipples.  Take care of yourself  Eat well-balanced meals and nutritious snacks.   Drinking milk, fruit juice, and water to satisfy your thirst (about 8 glasses a day).   Get plenty of rest.  Avoid foods that you notice affect the baby in a bad way.  SEEK MEDICAL CARE IF:   You have difficulty with breastfeeding and need help.   You have a hard, red, sore area on your breast that is accompanied by a fever.   Your baby is   too sleepy to eat well or is having trouble sleeping.   Your baby is wetting less than 6 diapers a day, by 5 days of age.   Your baby's skin or white part of his or her eyes is more yellow than it was in the hospital.   You feel depressed.  Document Released: 03/22/2005 Document Revised: 09/21/2011 Document Reviewed: 06/20/2011 [employer] Patient Information 2013 [employer].  

## 2012-08-14 NOTE — Progress Notes (Signed)
   Subjective:    Diana Torres is a G1P0 [redacted]w[redacted]d being seen today for her first obstetrical visit.  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 He partner is also hearing impaired. She is transferring care to our practise as she desires to deliver at [facility] hospital. Prenatal care has been uncomplicated thus far. She does have a 1hr GCT in March of 137. She reports having a normal 3 hr test, will obtain records. Patient does intend to breast feed. Pregnancy history fully reviewed.  Patient reports no complaints.  Filed Vitals:   08/14/12 1021  BP: 120/84  Temp: 96.6 F (35.9 C)  Weight: 183 lb 1.6 oz (83.054 kg)    HISTORY: OB History   Grav Para Term Preterm Abortions TAB SAB Ect Mult Living   1              # Outc Date GA Lbr Len/2nd Wgt Sex Del Anes PTL Lv   1 GRA            Comments: System Generated. Please review and update pregnancy details.     Past Medical History  Diagnosis Date  . Deaf    Past Surgical History  Procedure Laterality Date  . Tonsillectomy    . Cochlear implant     No family history on file.   Exam    Uterus:     Pelvic Exam:    Perineum: Normal Perineum   Vulva: normal   Vagina:  normal mucosa, normal discharge   pH:    Cervix:    Adnexa: not evaluated   Bony Pelvis: android                                                  Assessment:    Pregnancy: G1P0 Patient Active Problem List   Diagnosis Date Noted  . Hearing impaired 08/14/2012  . Pregnancy, supervision of first 08/14/2012        Plan:     Prenatal records reviewed. Prenatal vitamins. Problem list reviewed and updated. Cultures collected today Will need to obtain records of 3 hr GTT  Follow up in 1 weeks. 50% of 30 min visit spent on counseling and coordination of care.     Rosealynn Mateus 08/14/2012

## 2012-08-15 LAB — GC/CHLAMYDIA PROBE AMP: GC Probe RNA: NEGATIVE

## 2012-08-16 ENCOUNTER — Encounter: Admitting: Obstetrics and Gynecology

## 2012-08-18 ENCOUNTER — Encounter: Payer: Self-pay | Admitting: Obstetrics and Gynecology

## 2012-08-23 ENCOUNTER — Encounter: Payer: Self-pay | Admitting: Obstetrics & Gynecology

## 2012-08-23 ENCOUNTER — Ambulatory Visit (INDEPENDENT_AMBULATORY_CARE_PROVIDER_SITE_OTHER): Admitting: Obstetrics & Gynecology

## 2012-08-23 VITALS — BP 129/84 | Temp 97.5°F | Wt 183.3 lb

## 2012-08-23 DIAGNOSIS — Z3400 Encounter for supervision of normal first pregnancy, unspecified trimester: Secondary | ICD-10-CM

## 2012-08-23 LAB — POCT URINALYSIS DIP (DEVICE)
Nitrite: NEGATIVE
Urobilinogen, UA: 1 mg/dL (ref 0.0–1.0)
pH: 6.5 (ref 5.0–8.0)

## 2012-08-23 NOTE — Progress Notes (Signed)
Routine visit. Good FM. No OB problems. Labor precautions reviewed. 

## 2012-08-23 NOTE — Progress Notes (Signed)
Pulse- 105  Pain/pressure-lower back and a lot of pressure

## 2012-08-30 ENCOUNTER — Ambulatory Visit (INDEPENDENT_AMBULATORY_CARE_PROVIDER_SITE_OTHER): Admitting: Family Medicine

## 2012-08-30 VITALS — BP 121/84 | Temp 97.3°F | Wt 187.0 lb

## 2012-08-30 DIAGNOSIS — Z3400 Encounter for supervision of normal first pregnancy, unspecified trimester: Secondary | ICD-10-CM

## 2012-08-30 DIAGNOSIS — Z3403 Encounter for supervision of normal first pregnancy, third trimester: Secondary | ICD-10-CM

## 2012-08-30 LAB — POCT URINALYSIS DIP (DEVICE)
Nitrite: NEGATIVE
Protein, ur: NEGATIVE mg/dL
Urobilinogen, UA: 0.2 mg/dL (ref 0.0–1.0)
pH: 6 (ref 5.0–8.0)

## 2012-08-30 NOTE — Progress Notes (Signed)
Feeling some pressure and tingling in pelvis. Would like to be checked for cervical dilation. No discharge.

## 2012-08-30 NOTE — Patient Instructions (Addendum)
Pregnancy - Third Trimester The third trimester of pregnancy (the last 3 months) is a period of the most rapid growth for you and your baby. The baby approaches a length of 20 inches and a weight of 6 to 10 pounds. The baby is adding on fat and getting ready for life outside your body. While inside, babies have periods of sleeping and waking, sucking thumbs, and hiccuping. You can often feel small contractions of the uterus. This is false labor. It is also called Braxton-Hicks contractions. This is like a practice for labor. The usual problems in this stage of pregnancy include more difficulty breathing, swelling of the hands and feet from water retention, and having to urinate more often because of the uterus and baby pressing on your bladder.  PRENATAL EXAMS  Blood work may continue to be done during prenatal exams. These tests are done to check on your health and the probable health of your baby. Blood work is used to follow your blood levels (hemoglobin). Anemia (low hemoglobin) is common during pregnancy. Iron and vitamins are given to help prevent this. You may also continue to be checked for diabetes. Some of the past blood tests may be done again.  The size of the uterus is measured during each visit. This makes sure your baby is growing properly according to your pregnancy dates.  Your blood pressure is checked every prenatal visit. This is to make sure you are not getting toxemia.  Your urine is checked every prenatal visit for infection, diabetes, and protein.  Your weight is checked at each visit. This is done to make sure gains are happening at the suggested rate and that you and your baby are growing normally.  Sometimes, an ultrasound is performed to confirm the position and the proper growth and development of the baby. This is a test done that bounces harmless sound waves off the baby so your caregiver can more accurately determine a due date.  Discuss the type of pain medicine and  anesthesia you will have during your labor and delivery.  Discuss the possibility and anesthesia if a cesarean section might be necessary.  Inform your caregiver if there is any mental or physical violence at home. Sometimes, a specialized non-stress test, contraction stress test, and biophysical profile are done to make sure the baby is not having a problem. Checking the amniotic fluid surrounding the baby is called an amniocentesis. The amniotic fluid is removed by sticking a needle into the belly (abdomen). This is sometimes done near the end of pregnancy if an early delivery is required. In this case, it is done to help make sure the baby's lungs are mature enough for the baby to live outside of the womb. If the lungs are not mature and it is unsafe to deliver the baby, an injection of cortisone medicine is given to the mother 1 to 2 days before the delivery. This helps the baby's lungs mature and makes it safer to deliver the baby. CHANGES OCCURING IN THE THIRD TRIMESTER OF PREGNANCY Your body goes through many changes during pregnancy. They vary from person to person. Talk to your caregiver about changes you notice and are concerned about.  During the last trimester, you have probably had an increase in your appetite. It is normal to have cravings for certain foods. This varies from person to person and pregnancy to pregnancy.  You may begin to get stretch marks on your hips, abdomen, and breasts. These are normal changes in the body   during pregnancy. There are no exercises or medicines to take which prevent this change.  Constipation may be treated with a stool softener or adding bulk to your diet. Drinking lots of fluids, fiber in vegetables, fruits, and whole grains are helpful.  Exercising is also helpful. If you have been very active up until your pregnancy, most of these activities can be continued during your pregnancy. If you have been less active, it is helpful to start an exercise  program such as walking. Consult your caregiver before starting exercise programs.  Avoid all smoking, alcohol, non-prescribed drugs, herbs and "street drugs" during your pregnancy. These chemicals affect the formation and growth of the baby. Avoid chemicals throughout the pregnancy to ensure the delivery of a healthy infant.  Backache, varicose veins, and hemorrhoids may develop or get worse.  You will tire more easily in the third trimester, which is normal.  The baby's movements may be stronger and more often.  You may become short of breath easily.  Your belly button may stick out.  A yellow discharge may leak from your breasts called colostrum.  You may have a bloody mucus discharge. This usually occurs a few days to a week before labor begins. HOME CARE INSTRUCTIONS   Keep your caregiver's appointments. Follow your caregiver's instructions regarding medicine use, exercise, and diet.  During pregnancy, you are providing food for you and your baby. Continue to eat regular, well-balanced meals. Choose foods such as meat, fish, milk and other low fat dairy products, vegetables, fruits, and whole-grain breads and cereals. Your caregiver will tell you of the ideal weight gain.  A physical sexual relationship may be continued throughout pregnancy if there are no other problems such as early (premature) leaking of amniotic fluid from the membranes, vaginal bleeding, or belly (abdominal) pain.  Exercise regularly if there are no restrictions. Check with your caregiver if you are unsure of the safety of your exercises. Greater weight gain will occur in the last 2 trimesters of pregnancy. Exercising helps:  Control your weight.  Get you in shape for labor and delivery.  You lose weight after you deliver.  Rest a lot with legs elevated, or as needed for leg cramps or low back pain.  Wear a good support or jogging bra for breast tenderness during pregnancy. This may help if worn during  sleep. Pads or tissues may be used in the bra if you are leaking colostrum.  Do not use hot tubs, steam rooms, or saunas.  Wear your seat belt when driving. This protects you and your baby if you are in an accident.  Avoid raw meat, cat litter boxes and soil used by cats. These carry germs that can cause birth defects in the baby.  It is easier to leak urine during pregnancy. Tightening up and strengthening the pelvic muscles will help with this problem. You can practice stopping your urination while you are going to the bathroom. These are the same muscles you need to strengthen. It is also the muscles you would use if you were trying to stop from passing gas. You can practice tightening these muscles up 10 times a set and repeating this about 3 times per day. Once you know what muscles to tighten up, do not perform these exercises during urination. It is more likely to cause an infection by backing up the urine.  Ask for help if you have financial, counseling, or nutritional needs during pregnancy. Your caregiver will be able to offer counseling for these   needs as well as refer you for other special needs.  Make a list of emergency phone numbers and have them available.  Plan on getting help from family or friends when you go home from the hospital.  Make a trial run to the hospital.  Take prenatal classes with the father to understand, practice, and ask questions about the labor and delivery.  Prepare the baby's room or nursery.  Do not travel out of the city unless it is absolutely necessary and with the advice of your caregiver.  Wear only low or no heal shoes to have better balance and prevent falling. MEDICINES AND DRUG USE IN PREGNANCY  Take prenatal vitamins as directed. The vitamin should contain 1 milligram of folic acid. Keep all vitamins out of reach of children. Only a couple vitamins or tablets containing iron may be fatal to a baby or young child when ingested.  Avoid use  of all medicines, including herbs, over-the-counter medicines, not prescribed or suggested by your caregiver. Only take over-the-counter or prescription medicines for pain, discomfort, or fever as directed by your caregiver. Do not use aspirin, ibuprofen or naproxen unless approved by your caregiver.  Let your caregiver also know about herbs you may be using.  Alcohol is related to a number of birth defects. This includes fetal alcohol syndrome. All alcohol, in any form, should be avoided completely. Smoking will cause low birth rate and premature babies.  Illegal drugs are very harmful to the baby. They are absolutely forbidden. A baby born to an addicted mother will be addicted at birth. The baby will go through the same withdrawal an adult does. SEEK MEDICAL CARE IF: You have any concerns or worries during your pregnancy. It is better to call with your questions if you feel they cannot wait, rather than worry about them. SEEK IMMEDIATE MEDICAL CARE IF:   An unexplained oral temperature above 102 F (38.9 C) develops, or as your caregiver suggests.  You have leaking of fluid from the vagina. If leaking membranes are suspected, take your temperature and tell your caregiver of this when you call.  There is vaginal spotting, bleeding or passing clots. Tell your caregiver of the amount and how many pads are used.  You develop a bad smelling vaginal discharge with a change in the color from clear to white.  You develop vomiting that lasts more than 24 hours.  You develop chills or fever.  You develop shortness of breath.  You develop burning on urination.  You loose more than 2 pounds of weight or gain more than 2 pounds of weight or as suggested by your caregiver.  You notice sudden swelling of your face, hands, and feet or legs.  You develop belly (abdominal) pain. Round ligament discomfort is a common non-cancerous (benign) cause of abdominal pain in pregnancy. Your caregiver still  must evaluate you.  You develop a severe headache that does not go away.  You develop visual problems, blurred or double vision.  If you have not felt your baby move for more than 1 hour. If you think the baby is not moving as much as usual, eat something with sugar in it and lie down on your left side for an hour. The baby should move at least 4 to 5 times per hour. Call right away if your baby moves less than that.  You fall, are in a car accident, or any kind of trauma.  There is mental or physical violence at home. Document Released: 03/16/2001   Document Revised: 12/15/2011 Document Reviewed: 09/18/2008 [facility] Patient Information 2014 [place], [place].  Vaginal Delivery Your caregiver must first be sure you are in labor. Signs of labor include:  You may pass what is called "the mucus plug" before labor begins. This is a small amount of blood stained mucus.  Regular uterine contractions.  The time between contractions get closer together.  The discomfort and pain gradually gets more intense.  Pains are mostly located in the back.  Pains get worse when walking.  The cervix (the opening of the uterus) becomes thinner (begins to efface) and opens up (dilates). Once you are in labor and admitted into the hospital or care center, your caregiver will do the following:  A complete physical examination.  Check your vital signs (blood pressure, pulse, temperature and the fetal heart rate).  Do a vaginal examination (using a sterile glove and lubricant) to determine:  The position (presentation) of the baby (head [vertex] or buttock first).  The level (station) of the baby's head in the birth canal.  The effacement and dilatation of the cervix.  You may have your pubic hair shaved and be given an enema depending on your caregiver and the circumstance.  An electronic monitor is usually placed on your abdomen. The monitor follows the length and intensity of the contractions, as  well as the baby's heart rate.  Usually, your caregiver will insert an IV in your arm with a bottle of sugar water. This is done as a precaution so that medications can be given to you quickly during labor or delivery. NORMAL LABOR AND DELIVERY IS DIVIDED UP INTO 3 STAGES: First Stage This is when regular contractions begin and the cervix begins to efface and dilate. This stage can last from 3 to 15 hours. The end of the first stage is when the cervix is 100% effaced and 10 centimeters dilated. Pain medications may be given by   Injection (morphine, demerol, etc.)  Regional anesthesia (spinal, caudal or epidural, anesthetics given in different locations of the spine). Paracervical pain medication may be given, which is an injection of and anesthetic on each side of the cervix. A pregnant woman may request to have "Natural Childbirth" which is not to have any medications or anesthesia during her labor and delivery. Second Stage This is when the baby comes down through the birth canal (vagina) and is born. This can take 1 to 4 hours. As the baby's head comes down through the birth canal, you may feel like you are going to have a bowel movement. You will get the urge to bear down and push until the baby is delivered. As the baby's head is being delivered, the caregiver will decide if an episiotomy (a cut in the perineum and vagina area) is needed to prevent tearing of the tissue in this area. The episiotomy is sewn up after the delivery of the baby and placenta. Sometimes a mask with nitrous oxide is given for the mother to breath during the delivery of the baby to help if there is too much pain. The end of Stage 2 is when the baby is fully delivered. Then when the umbilical cord stops pulsating it is clamped and cut. Third Stage The third stage begins after the baby is completely delivered and ends after the placenta (afterbirth) is delivered. This usually takes 5 to 30 minutes. After the placenta is  delivered, a medication is given either by intravenous or injection to help contract the uterus and prevent bleeding. The  third stage is not painful and pain medication is usually not necessary. If an episiotomy was done, it is repaired at this time. After the delivery, the mother is watched and monitored closely for 1 to 2 hours to make sure there is no postpartum bleeding (hemorrhage). If there is a lot of bleeding, medication is given to contract the uterus and stop the bleeding. Document Released: 12/30/2007 Document Revised: 12/15/2011 Document Reviewed: 12/30/2007 [facility] Patient Information 2014 [place], [place].

## 2012-08-30 NOTE — Progress Notes (Signed)
Pulse 92 C/o "pinching" type of pain on pubis area. Vaginal d/c stated as thin clear.

## 2012-09-06 ENCOUNTER — Ambulatory Visit (INDEPENDENT_AMBULATORY_CARE_PROVIDER_SITE_OTHER): Payer: 59 | Admitting: Advanced Practice Midwife

## 2012-09-06 VITALS — BP 123/83 | Temp 97.0°F | Wt 185.9 lb

## 2012-09-06 DIAGNOSIS — Z3403 Encounter for supervision of normal first pregnancy, third trimester: Secondary | ICD-10-CM

## 2012-09-06 DIAGNOSIS — Z3400 Encounter for supervision of normal first pregnancy, unspecified trimester: Secondary | ICD-10-CM

## 2012-09-06 LAB — POCT URINALYSIS DIP (DEVICE)
Bilirubin Urine: NEGATIVE
Glucose, UA: NEGATIVE mg/dL
Nitrite: NEGATIVE
Urobilinogen, UA: 0.2 mg/dL (ref 0.0–1.0)

## 2012-09-06 NOTE — Progress Notes (Signed)
Desires delayed cord clamping.

## 2012-09-06 NOTE — Progress Notes (Signed)
P=87, Used [employer] . C/o more pelvic pressure in last week and pinching in vagina. Reviewed signs of labor.

## 2012-09-12 ENCOUNTER — Inpatient Hospital Stay (HOSPITAL_COMMUNITY)
Admission: AD | Admit: 2012-09-12 | Discharge: 2012-09-12 | Disposition: A | Discharge status: Home / Self Care | Payer: 59 | Source: Ambulatory Visit | Attending: Obstetrics & Gynecology | Admitting: Obstetrics & Gynecology

## 2012-09-12 ENCOUNTER — Encounter (HOSPITAL_COMMUNITY): Payer: Self-pay | Admitting: *Deleted

## 2012-09-12 DIAGNOSIS — O479 False labor, unspecified: Secondary | ICD-10-CM | POA: Insufficient documentation

## 2012-09-12 DIAGNOSIS — Z3403 Encounter for supervision of normal first pregnancy, third trimester: Secondary | ICD-10-CM

## 2012-09-12 DIAGNOSIS — R197 Diarrhea, unspecified: Secondary | ICD-10-CM | POA: Insufficient documentation

## 2012-09-12 DIAGNOSIS — R109 Unspecified abdominal pain: Secondary | ICD-10-CM | POA: Insufficient documentation

## 2012-09-12 DIAGNOSIS — O368190 Decreased fetal movements, unspecified trimester, not applicable or unspecified: Secondary | ICD-10-CM | POA: Insufficient documentation

## 2012-09-12 DIAGNOSIS — O99891 Other specified diseases and conditions complicating pregnancy: Secondary | ICD-10-CM | POA: Insufficient documentation

## 2012-09-12 DIAGNOSIS — O368130 Decreased fetal movements, third trimester, not applicable or unspecified: Secondary | ICD-10-CM

## 2012-09-12 NOTE — MAU Note (Signed)
Mother of pt acts as the deaf intetper for the pt

## 2012-09-12 NOTE — MAU Note (Signed)
Pt. Here due to decreased fetal movement that she has been experiencing for 2 days.  Denies any leakage of fluid. Did have some spotting today but was here yesterday and was checked. Pt. Said she knew that was normal after being checked. Denies any other problems.

## 2012-09-12 NOTE — MAU Note (Signed)
PT SAYS  THROUGH  AN INTERPRETER- PT IS DEAF-  SIGNING.  SAID HURT BAD   AT .    VE IN CLINIC-   1 CM.  ON 5-27.     DENIES HSV AND MRSA.

## 2012-09-12 NOTE — MAU Provider Note (Signed)
  History     CSN: [id number]  Arrival date and time: 09/12/12 1918  HPI  22 y.o. G1P0 [redacted]w[redacted]d presents to MAU secondary to decreased fetal movement.  Patient reports decreased movement during the afternoon hours so she proceeded to the ED for further evaluation.  Patient denies any significant vaginal bleeding, vaginal discharge, or fluid leakage.    Patient is hearing impaired.  She is accompanied by her fiance and interpreter.    Past Medical History  Diagnosis Date  . Deaf   . Medical history non-contributory     Past Surgical History  Procedure Laterality Date  . Tonsillectomy    . Cochlear implant    . Rhinoplasty      History reviewed. No pertinent family history.  History  Substance Use Topics  . Smoking status: Never Smoker   . Smokeless tobacco: Never Used  . Alcohol Use: No    Allergies: No Known Allergies  Prescriptions prior to admission  Medication Sig Dispense Refill  . acetaminophen (TYLENOL) 500 MG tablet Take 1,000 mg by mouth every 6 (six) hours as needed. For pain.      . [DISCONTINUED] Prenatal Vit-Fe Fumarate-FA (PRENATAL MULTIVITAMIN) TABS Take 1 tablet by mouth daily at 12 noon.      . [DISCONTINUED] pseudoephedrine (SUDAFED) 30 MG tablet Take 30 mg by mouth every 4 (four) hours as needed for congestion.      . Prenatal Vit-Fe Fumarate-FA (MULTIVITAMIN-PRENATAL) 27-0.8 MG TABS Take 1 tablet by mouth daily.        Review of Systems  Constitutional: Negative.   Respiratory: Negative.   Cardiovascular: Negative.   Gastrointestinal: Negative.   Genitourinary:       Decreased fetal movement  Skin: Negative.    Physical Exam   Blood pressure 121/74, pulse 93, temperature 97.9 F (36.6 C), temperature source Oral, resp. rate 18, last menstrual period 12/02/2011, unknown if currently breastfeeding.  Physical Exam  Constitutional: She is oriented to person, place, and time. She appears well-developed and well-nourished.  HENT:  Head:  Normocephalic.  Neck: Neck supple.  Cardiovascular: Normal rate, regular rhythm and normal heart sounds.   Respiratory: Effort normal and breath sounds normal.  Genitourinary:  FNT - 130 bpm, + acels, moderate variability, no decels present  UC - irregular  Musculoskeletal: Normal range of motion.  Neurological: She is alert and oriented to person, place, and time.    MAU Course  Procedures  Patient placed on monitors for further evaluation.  Assessment and Plan  22 y.o. G1P0 [redacted]w[redacted]d presents to MAU secondary to decreased fetal movement.  FHT reactive with limited contractions present.  Patient felt to be safe for discharge home.  Patient counseled regarding kick counts and to return should she experience signs of labor.    Harvie Heck 09/12/2012, 9:04 PM   I have collaborated in the care of this patient and I agree with the above. FHR tracing reviewed. Clelia Croft, KIMBERLY 11:28 PM 09/12/2012

## 2012-09-12 NOTE — MAU Provider Note (Signed)
  History     CSN: [id number]  Arrival date and time: 09/12/12 0106  Chief Complaint  Patient presents with  . Labor Eval   HPI  22 y.o. G1P0 [redacted]w[redacted]d presents with lower abdominal pain and diarrhea for a two day duration.  The patient reports 4 episodes of diarrhea today and yesterday.  The patient denies any fever, chills, nausea, or vomiting.  She denies any vaginal bleeding, vaginal discharge, or fluid leakage.  She reports good fetal movement.    Patient deaf.  Interpreter present.  Past Medical History  Diagnosis Date  . Deaf   . Medical history non-contributory     Past Surgical History  Procedure Laterality Date  . Tonsillectomy    . Cochlear implant      History reviewed. No pertinent family history.  History  Substance Use Topics  . Smoking status: Never Smoker   . Smokeless tobacco: Never Used  . Alcohol Use: No    Allergies: No Known Allergies  Prescriptions prior to admission  Medication Sig Dispense Refill  . acetaminophen (TYLENOL) 500 MG tablet Take 1,000 mg by mouth every 6 (six) hours as needed. For pain.      . Prenatal Vit-Fe Fumarate-FA (MULTIVITAMIN-PRENATAL) 27-0.8 MG TABS Take 1 tablet by mouth daily.        Review of Systems  Constitutional: Negative.   HENT: Negative.   Respiratory: Negative.   Cardiovascular: Negative.   Gastrointestinal: Positive for abdominal pain and diarrhea. Negative for nausea and vomiting.  Skin: Negative.    Physical Exam   Blood pressure 111/76, pulse 74, temperature 98 F (36.7 C), temperature source Oral, resp. rate 20, height 5\' 3"  (1.6 m), weight 86.183 kg (190 lb), last menstrual period 12/02/2011, unknown if currently breastfeeding.  Physical Exam  Constitutional: She appears well-developed and well-nourished.  HENT:  Head: Normocephalic and atraumatic.  Neck: Neck supple.  Cardiovascular: Normal rate, regular rhythm and normal heart sounds.   Respiratory: Effort normal and breath sounds normal.   GI: Soft.  Genitourinary: Vagina normal.  Cervix - 1 cm, 60%, -3 FHT - 110, + acels, moderate variability, no decels present    MAU Course  Procedures   Assessment and Plan  22 y.o. G1P0 [redacted]w[redacted]d presents with lower abdominal pain and diarrhea for a two day duration.  Cervical exam essentially unchanged from exam at last prenatal visit.  FHT reassuring.  No active signs of labor.  Will discharge home as likely experienced viral illness.  Patient counseled regarding signs of labor.    [facility] 09/12/2012, 1:53 AM   I saw and examined patient and agree with above resident note. I reviewed history, imaging, labs, and vitals. I personally reviewed the fetal heart tracing, and it is reactive though baseline on low normal side. Pt advised to drink plenty of fluids and advance diet slowly. Napoleon Form, MD

## 2012-09-12 NOTE — MAU Provider Note (Signed)
Attestation of Attending Supervision of Advanced Practitioner (PA/CNM/NP): Evaluation and management procedures were performed by the Advanced Practitioner under my supervision and collaboration.  I have reviewed the Advanced Practitioner's note and chart, and I agree with the management and plan.  Karizma Cheek, MD, FACOG Attending Obstetrician & Gynecologist Faculty Practice, [facility]   

## 2012-09-13 ENCOUNTER — Ambulatory Visit (INDEPENDENT_AMBULATORY_CARE_PROVIDER_SITE_OTHER): Admitting: Advanced Practice Midwife

## 2012-09-13 VITALS — BP 125/78 | Temp 96.5°F | Wt 188.0 lb

## 2012-09-13 DIAGNOSIS — O0930 Supervision of pregnancy with insufficient antenatal care, unspecified trimester: Secondary | ICD-10-CM

## 2012-09-13 DIAGNOSIS — Z3493 Encounter for supervision of normal pregnancy, unspecified, third trimester: Secondary | ICD-10-CM

## 2012-09-13 LAB — POCT URINALYSIS DIP (DEVICE)
Bilirubin Urine: NEGATIVE
Glucose, UA: NEGATIVE mg/dL
Ketones, ur: NEGATIVE mg/dL
Nitrite: NEGATIVE

## 2012-09-13 NOTE — Progress Notes (Signed)
Pulse-  68  Pain-back, lower area, legs  Pressure- "from baby on my cervix" Edema-  Feet/hands

## 2012-09-13 NOTE — Progress Notes (Signed)
Had some contractions last night. Baby has been moving normally.

## 2012-09-19 ENCOUNTER — Inpatient Hospital Stay (HOSPITAL_COMMUNITY)
Admission: AD | Admit: 2012-09-19 | Discharge: 2012-09-19 | Disposition: A | Discharge status: Home / Self Care | Payer: 59 | Source: Ambulatory Visit | Attending: Obstetrics & Gynecology | Admitting: Obstetrics & Gynecology

## 2012-09-19 DIAGNOSIS — O479 False labor, unspecified: Secondary | ICD-10-CM | POA: Insufficient documentation

## 2012-09-19 NOTE — MAU Note (Signed)
Pt states contractions are q 4-6 min apaprt-note pt is deaf and her mother signs for her

## 2012-09-20 ENCOUNTER — Encounter: Payer: Self-pay | Admitting: Advanced Practice Midwife

## 2012-09-20 ENCOUNTER — Ambulatory Visit (INDEPENDENT_AMBULATORY_CARE_PROVIDER_SITE_OTHER): Admitting: Advanced Practice Midwife

## 2012-09-20 ENCOUNTER — Telehealth (HOSPITAL_COMMUNITY): Payer: Self-pay | Admitting: *Deleted

## 2012-09-20 VITALS — BP 128/89 | Wt 191.2 lb

## 2012-09-20 DIAGNOSIS — R030 Elevated blood-pressure reading, without diagnosis of hypertension: Secondary | ICD-10-CM | POA: Insufficient documentation

## 2012-09-20 DIAGNOSIS — O480 Post-term pregnancy: Secondary | ICD-10-CM

## 2012-09-20 LAB — CBC
HCT: 37.2 % (ref 36.0–46.0)
MCH: 28.2 pg (ref 26.0–34.0)
MCV: 83.2 fL (ref 78.0–100.0)
Platelets: 174 10*3/uL (ref 150–400)
RDW: 14.7 % (ref 11.5–15.5)
WBC: 10.4 10*3/uL (ref 4.0–10.5)

## 2012-09-20 LAB — COMPREHENSIVE METABOLIC PANEL
AST: 21 U/L (ref 0–37)
BUN: 10 mg/dL (ref 6–23)
Calcium: 9.5 mg/dL (ref 8.4–10.5)
Chloride: 104 mEq/L (ref 96–112)
Creat: 0.68 mg/dL (ref 0.50–1.10)
Total Bilirubin: 0.3 mg/dL (ref 0.3–1.2)

## 2012-09-20 LAB — POCT URINALYSIS DIP (DEVICE)
Ketones, ur: NEGATIVE mg/dL
Protein, ur: NEGATIVE mg/dL
Specific Gravity, Urine: >=1.03 (ref 1.005–1.030)
Urobilinogen, UA: 0.2 mg/dL (ref 0.0–1.0)
pH: 6 (ref 5.0–8.0)

## 2012-09-20 NOTE — Progress Notes (Signed)
P = 97      Pt seen @ MAU yesterday for r/o labor.  NST not needed for today.  AFI done.

## 2012-09-20 NOTE — Patient Instructions (Signed)

## 2012-09-20 NOTE — Progress Notes (Signed)
IOL sched for Friday 6/20 at 0700.  Has been having more frequent UCs this week. Recheck BP 125/75. Will check labs here but send her home. Modified bedrest. IOL scheduled a little on the early side of postdates due to borderline hypertension. CBC, CMET and Pr/Cr ratio done today in clinic. Informed her that if they come back abnormal, will bring in prior to Friday.

## 2012-09-20 NOTE — Addendum Note (Signed)
Addended by: Franchot Mimes on: 09/20/2012 12:24 PM   Modules accepted: Orders

## 2012-09-21 ENCOUNTER — Encounter: Payer: Self-pay | Admitting: *Deleted

## 2012-09-21 LAB — PROTEIN / CREATININE RATIO, URINE
Creatinine, Urine: 186.4 mg/dL
Protein Creatinine Ratio: 0.05 (ref <0.15)

## 2012-09-22 ENCOUNTER — Encounter (HOSPITAL_COMMUNITY): Payer: Self-pay

## 2012-09-22 ENCOUNTER — Inpatient Hospital Stay (HOSPITAL_COMMUNITY)
Admission: RE | Admit: 2012-09-22 | Discharge: 2012-09-25 | DRG: 775 | Disposition: A | Discharge status: Home / Self Care | Payer: Medicaid Other | Source: Ambulatory Visit | Attending: Family Medicine | Admitting: Family Medicine

## 2012-09-22 ENCOUNTER — Inpatient Hospital Stay (HOSPITAL_COMMUNITY): Payer: Medicaid Other | Admitting: Anesthesiology

## 2012-09-22 ENCOUNTER — Encounter (HOSPITAL_COMMUNITY): Payer: Self-pay | Admitting: Anesthesiology

## 2012-09-22 DIAGNOSIS — O480 Post-term pregnancy: Principal | ICD-10-CM | POA: Diagnosis present

## 2012-09-22 DIAGNOSIS — R030 Elevated blood-pressure reading, without diagnosis of hypertension: Secondary | ICD-10-CM

## 2012-09-22 DIAGNOSIS — Z3403 Encounter for supervision of normal first pregnancy, third trimester: Secondary | ICD-10-CM

## 2012-09-22 LAB — CBC
Hemoglobin: 12.6 g/dL (ref 12.0–15.0)
MCH: 29 pg (ref 26.0–34.0)
MCH: 29.1 pg (ref 26.0–34.0)
MCV: 85.8 fL (ref 78.0–100.0)
MCV: 86.1 fL (ref 78.0–100.0)
Platelets: 155 10*3/uL (ref 150–400)
RBC: 4.33 MIL/uL (ref 3.87–5.11)
RDW: 14.7 % (ref 11.5–15.5)
WBC: 11.9 10*3/uL — ABNORMAL HIGH (ref 4.0–10.5)

## 2012-09-22 LAB — RPR: RPR Ser Ql: NONREACTIVE

## 2012-09-22 MED ORDER — ACETAMINOPHEN 325 MG PO TABS
650.0000 mg | ORAL_TABLET | ORAL | Status: DC | PRN
  Administered 2012-09-23 (×2): 650 mg via ORAL
  Filled 2012-09-22 (×2): qty 2

## 2012-09-22 MED ORDER — PHENYLEPHRINE 40 MCG/ML (10ML) SYRINGE FOR IV PUSH (FOR BLOOD PRESSURE SUPPORT)
80.0000 ug | PREFILLED_SYRINGE | INTRAVENOUS | Status: DC | PRN
  Filled 2012-09-22: qty 5
  Filled 2012-09-22: qty 2

## 2012-09-22 MED ORDER — EPHEDRINE 5 MG/ML INJ
10.0000 mg | INTRAVENOUS | Status: DC | PRN
  Filled 2012-09-22: qty 2

## 2012-09-22 MED ORDER — CITRIC ACID-SODIUM CITRATE 334-500 MG/5ML PO SOLN: 30.0000 mL | ORAL | Status: DC | PRN

## 2012-09-22 MED ORDER — OXYTOCIN BOLUS FROM INFUSION: 500.0000 mL | INTRAVENOUS | Status: DC

## 2012-09-22 MED ORDER — PHENYLEPHRINE 40 MCG/ML (10ML) SYRINGE FOR IV PUSH (FOR BLOOD PRESSURE SUPPORT)
80.0000 ug | PREFILLED_SYRINGE | INTRAVENOUS | Status: DC | PRN
  Filled 2012-09-22: qty 2

## 2012-09-22 MED ORDER — FLEET ENEMA 7-19 GM/118ML RE ENEM: 1.0000 | ENEMA | RECTAL | Status: DC | PRN

## 2012-09-22 MED ORDER — LACTATED RINGERS IV SOLN: 500.0000 mL | Freq: Once | INTRAVENOUS | Status: DC

## 2012-09-22 MED ORDER — LIDOCAINE HCL (PF) 1 % IJ SOLN
INTRAMUSCULAR | Status: DC | PRN
  Administered 2012-09-22 (×2): 5 mL

## 2012-09-22 MED ORDER — FENTANYL 2.5 MCG/ML BUPIVACAINE 1/10 % EPIDURAL INFUSION (WH - ANES)
14.0000 mL/h | INTRAMUSCULAR | Status: DC | PRN
  Administered 2012-09-22 – 2012-09-23 (×2): 14 mL/h via EPIDURAL
  Filled 2012-09-22 (×2): qty 125

## 2012-09-22 MED ORDER — LIDOCAINE HCL (PF) 1 % IJ SOLN
30.0000 mL | INTRAMUSCULAR | Status: DC | PRN
  Filled 2012-09-22 (×2): qty 30

## 2012-09-22 MED ORDER — LACTATED RINGERS IV SOLN: 500.0000 mL | INTRAVENOUS | Status: DC | PRN

## 2012-09-22 MED ORDER — EPHEDRINE 5 MG/ML INJ
10.0000 mg | INTRAVENOUS | Status: DC | PRN
  Filled 2012-09-22: qty 4
  Filled 2012-09-22: qty 2

## 2012-09-22 MED ORDER — LACTATED RINGERS IV SOLN
INTRAVENOUS | Status: DC
  Administered 2012-09-22: 12:00:00 via INTRAVENOUS
  Administered 2012-09-22 – 2012-09-23 (×2): 125 mL/h via INTRAVENOUS

## 2012-09-22 MED ORDER — DIPHENHYDRAMINE HCL 50 MG/ML IJ SOLN: 12.5000 mg | INTRAMUSCULAR | Status: DC | PRN

## 2012-09-22 MED ORDER — ONDANSETRON HCL 4 MG/2ML IJ SOLN: 4.0000 mg | Freq: Four times a day (QID) | INTRAMUSCULAR | Status: DC | PRN

## 2012-09-22 MED ORDER — FENTANYL CITRATE 0.05 MG/ML IJ SOLN
50.0000 ug | INTRAMUSCULAR | Status: DC | PRN
  Administered 2012-09-22 (×3): 50 ug via INTRAVENOUS
  Filled 2012-09-22 (×3): qty 2

## 2012-09-22 MED ORDER — OXYCODONE-ACETAMINOPHEN 5-325 MG PO TABS: 1.0000 | ORAL_TABLET | ORAL | Status: DC | PRN

## 2012-09-22 MED ORDER — IBUPROFEN 600 MG PO TABS
600.0000 mg | ORAL_TABLET | Freq: Four times a day (QID) | ORAL | Status: DC | PRN
  Administered 2012-09-23: 600 mg via ORAL
  Filled 2012-09-22: qty 1

## 2012-09-22 MED ORDER — OXYTOCIN 40 UNITS IN LACTATED RINGERS INFUSION - SIMPLE MED
62.5000 mL/h | INTRAVENOUS | Status: DC
  Filled 2012-09-22: qty 1000

## 2012-09-22 NOTE — Progress Notes (Signed)
   Subjective: Reports comfort with the epidural.  No questions or concerns.   Objective: BP 119/67  Pulse 90  Temp(Src) 98.3 F (36.8 C) (Oral)  Resp 16  Ht 5\' 3"  (1.6 m)  Wt 86.637 kg (191 lb)  BMI 33.84 kg/m2  SpO2 98%  LMP 12/02/2011      FHT:  FHR: 120's bpm, variability: moderate,  accelerations:  Present,  decelerations:  Absent UC:   irregular, every 3-6 minutes SVE:   Dilation: 7 Effacement (%): 80 Station: -1 Exam by:: Roney Marion, CNM  Labs: Lab Results  Component Value Date   WBC 13.6* 09/22/2012   HGB 12.6 09/22/2012   HCT 37.3 09/22/2012   MCV 86.1 09/22/2012   PLT 141* 09/22/2012    Assessment / Plan: Augmentation of labor, progressing well  Labor: Progressing normally Preeclampsia:  n/a Fetal Wellbeing:  Category I Pain Control:  Epidural I/D:  GBS neg Anticipated MOD:  NSVD  [facility] 09/22/2012, 8:48 PM

## 2012-09-22 NOTE — H&P (Signed)
Diana Torres is a 22 y.o. female presenting for Contractions, but also has appt for IOL today. Maternal Medical History:  Reason for admission: Contractions.  Nausea.  Contractions: Onset was 1-2 hours ago.   Frequency: irregular.   Perceived severity is mild.    Fetal activity: Perceived fetal activity is normal.   Last perceived fetal movement was within the past hour.    Prenatal complications: No bleeding.   Prenatal Complications - Diabetes: none.    OB History   Grav Para Term Preterm Abortions TAB SAB Ect Mult Living   1 0             Past Medical History  Diagnosis Date  . Deaf   . Medical history non-contributory    Past Surgical History  Procedure Laterality Date  . Tonsillectomy    . Cochlear implant    . Rhinoplasty     Family History: family history is not on file. Social History:  reports that she has never smoked. She has never used smokeless tobacco. She reports that she does not drink alcohol or use illicit drugs.   Review of Systems  Constitutional: Negative for fever, chills and malaise/fatigue.  Eyes: Negative for blurred vision.  Gastrointestinal: Positive for abdominal pain (mild with contractions). Negative for nausea, vomiting, diarrhea and constipation.  Genitourinary: Negative for dysuria.  Neurological: Negative for dizziness, focal weakness and headaches.    Dilation: 1.5 Effacement (%): 70 Station: -2 Exam by:: D Simpson RN Blood pressure 121/83, pulse 92, temperature 98.3 F (36.8 C), temperature source Oral, resp. rate 18, height 5\' 3"  (1.6 m), weight 86.637 kg (191 lb), last menstrual period 12/02/2011, SpO2 100.00%. Maternal Exam:  Uterine Assessment: Contraction strength is mild.  Contraction frequency is irregular.   Abdomen: Fundal height is 39.   Fetal presentation: vertex  Introitus: Normal vulva. Normal vagina.  Vagina is negative for discharge.  Ferning test: not done.  Nitrazine test: not done. Amniotic fluid  character: not assessed.  Pelvis: adequate for delivery.   Cervix: Cervix evaluated by digital exam.     Fetal Exam Fetal Monitor Review: Mode: ultrasound.   Baseline rate: 140.  Variability: moderate (6-25 bpm).   Pattern: accelerations present and no decelerations.    Fetal State Assessment: Category I - tracings are normal.     Physical Exam  Constitutional: She is oriented to person, place, and time. She appears well-developed and well-nourished. No distress.  HENT:  Head: Normocephalic.  Cardiovascular: Normal rate.   Respiratory: Effort normal.  GI: Soft. She exhibits no distension and no mass. There is no tenderness. There is no rebound and no guarding.  Genitourinary: Vagina normal and uterus normal. No vaginal discharge found.  Musculoskeletal: Normal range of motion. She exhibits no edema.  Neurological: She is alert and oriented to person, place, and time. She has normal reflexes.  Skin: Skin is warm and dry.  Psychiatric: She has a normal mood and affect.    Prenatal labs: ABO, Rh:   Antibody: Negative (12/13 0000) Rubella: Immune (12/13 0000) RPR: Nonreactive (12/13 0000)  HBsAg: Negative (12/13 0000)  HIV: Non-reactive (12/13 0000)  GBS: Negative (05/12 0000)   PIH labs normal 2 days ago  Assessment/Plan: A:  SIUP at [redacted]w[redacted]d       Post dates pregnancy       P:  Admit for BIrthing Suites       Routine orders       Watch BP       Probably  Foley and Pitocin  [facility] 09/22/2012, 7:49 AM

## 2012-09-22 NOTE — Anesthesia Preprocedure Evaluation (Signed)
Anesthesia Evaluation  Patient identified by MRN, date of birth, ID band Patient awake    Reviewed: Allergy & Precautions, H&P , Patient's Chart, lab work & pertinent test results  Airway Mallampati: II TM Distance: >3 FB Neck ROM: full    Dental no notable dental hx.    Pulmonary neg pulmonary ROS,  breath sounds clear to auscultation  Pulmonary exam normal       Cardiovascular hypertension, negative cardio ROS  Rhythm:regular Rate:Normal     Neuro/Psych negative neurological ROS  negative psych ROS   GI/Hepatic negative GI ROS, Neg liver ROS,   Endo/Other  negative endocrine ROS  Renal/GU negative Renal ROS     Musculoskeletal   Abdominal   Peds  Hematology negative hematology ROS (+)   Anesthesia Other Findings Deaf  Reproductive/Obstetrics (+) Pregnancy                           Anesthesia Physical Anesthesia Plan  ASA: III  Anesthesia Plan: Epidural   Post-op Pain Management:    Induction:   Airway Management Planned:   Additional Equipment:   Intra-op Plan:   Post-operative Plan:   Informed Consent: I have reviewed the patients History and Physical, chart, labs and discussed the procedure including the risks, benefits and alternatives for the proposed anesthesia with the patient or authorized representative who has indicated his/her understanding and acceptance.     Plan Discussed with:   Anesthesia Plan Comments:         Anesthesia Quick Evaluation

## 2012-09-22 NOTE — Anesthesia Procedure Notes (Signed)
Epidural Patient location during procedure: OB Start time: 09/22/2012 8:01 PM  Staffing Anesthesiologist: Angus Seller., Harrell Gave. Performed by: anesthesiologist   Preanesthetic Checklist Completed: patient identified, site marked, surgical consent, pre-op evaluation, timeout performed, IV checked, risks and benefits discussed and monitors and equipment checked  Epidural Patient position: sitting Prep: site prepped and draped and DuraPrep Patient monitoring: continuous pulse ox and blood pressure Approach: midline Injection technique: LOR air and LOR saline  Needle:  Needle type: Tuohy  Needle gauge: 17 G Needle length: 9 cm and 9 Needle insertion depth: 5 cm cm Catheter type: closed end flexible Catheter size: 19 Gauge Catheter at skin depth: 10 cm Test dose: negative  Assessment Events: blood not aspirated, injection not painful, no injection resistance, negative IV test and no paresthesia  Additional Notes Patient identified.  Risk benefits discussed including failed block, incomplete pain control, headache, nerve damage, paralysis, blood pressure changes, nausea, vomiting, reactions to medication both toxic or allergic, and postpartum back pain.  Patient expressed understanding and wished to proceed.  All questions were answered.  Sterile technique used throughout procedure and epidural site dressed with sterile barrier dressing. No paresthesia or other complications noted.The patient did not experience any signs of intravascular injection such as tinnitus or metallic taste in mouth nor signs of intrathecal spread such as rapid motor block. Please see nursing notes for vital signs.

## 2012-09-22 NOTE — Progress Notes (Signed)
Patient ID: Diana Torres, female   DOB: [birth year], 22 y.o.   MRN: [id number]  AVSS Filed Vitals:   09/22/12 0611 09/22/12 0719  BP: 127/90 121/83  Pulse: 97 92  Temp: 97.3 F (36.3 C) 98.3 F (36.8 C)  TempSrc: Oral Oral  Resp: 18 18  Height:  5\' 3"  (1.6 m)  Weight:  86.637 kg (191 lb)  SpO2: 100%    FHR stable Irregular contractions  Dilation: 2 Effacement (%): 80 Cervical Position: Posterior Station: -2 Presentation: Vertex Exam by:: cnm Agnieszka Newhouse  Foley inserted (Cook Catheter)  Hurting a lot with contractions  Fentanyl ordered PRN for pain

## 2012-09-22 NOTE — MAU Note (Signed)
Pt states via interpreter that UC began around 315 and are every 3-4 minutes. Denies LOF, vaginal bleeding. States good FM

## 2012-09-23 ENCOUNTER — Encounter (HOSPITAL_COMMUNITY): Payer: Self-pay

## 2012-09-23 DIAGNOSIS — O480 Post-term pregnancy: Secondary | ICD-10-CM

## 2012-09-23 LAB — CBC
Hemoglobin: 12.4 g/dL (ref 12.0–15.0)
MCH: 29 pg (ref 26.0–34.0)
RBC: 4.27 MIL/uL (ref 3.87–5.11)

## 2012-09-23 MED ORDER — WITCH HAZEL-GLYCERIN EX PADS
1.0000 "application " | MEDICATED_PAD | CUTANEOUS | Status: DC | PRN
  Administered 2012-09-24: 1 via TOPICAL

## 2012-09-23 MED ORDER — SODIUM CHLORIDE 0.9 % IV SOLN
2.0000 g | Freq: Four times a day (QID) | INTRAVENOUS | Status: DC
  Administered 2012-09-23: 2 g via INTRAVENOUS
  Filled 2012-09-23 (×2): qty 2000

## 2012-09-23 MED ORDER — ONDANSETRON HCL 4 MG/2ML IJ SOLN: 4.0000 mg | INTRAMUSCULAR | Status: DC | PRN

## 2012-09-23 MED ORDER — IBUPROFEN 600 MG PO TABS
600.0000 mg | ORAL_TABLET | Freq: Four times a day (QID) | ORAL | Status: DC
  Administered 2012-09-23 – 2012-09-25 (×7): 600 mg via ORAL
  Filled 2012-09-23 (×7): qty 1

## 2012-09-23 MED ORDER — SIMETHICONE 80 MG PO CHEW
80.0000 mg | CHEWABLE_TABLET | ORAL | Status: DC | PRN
  Administered 2012-09-24: 80 mg via ORAL

## 2012-09-23 MED ORDER — OXYTOCIN 40 UNITS IN LACTATED RINGERS INFUSION - SIMPLE MED
1.0000 m[IU]/min | INTRAVENOUS | Status: DC
  Administered 2012-09-23: 1 m[IU]/min via INTRAVENOUS

## 2012-09-23 MED ORDER — DIBUCAINE 1 % RE OINT
1.0000 "application " | TOPICAL_OINTMENT | RECTAL | Status: DC | PRN
  Administered 2012-09-24: 1 via RECTAL
  Filled 2012-09-23: qty 28

## 2012-09-23 MED ORDER — DIPHENHYDRAMINE HCL 25 MG PO CAPS: 25.0000 mg | ORAL_CAPSULE | Freq: Four times a day (QID) | ORAL | Status: DC | PRN

## 2012-09-23 MED ORDER — OXYCODONE-ACETAMINOPHEN 5-325 MG PO TABS
1.0000 | ORAL_TABLET | ORAL | Status: DC | PRN
  Administered 2012-09-23 – 2012-09-25 (×5): 1 via ORAL
  Filled 2012-09-23 (×5): qty 1

## 2012-09-23 MED ORDER — SENNOSIDES-DOCUSATE SODIUM 8.6-50 MG PO TABS
2.0000 | ORAL_TABLET | Freq: Every day | ORAL | Status: DC
  Administered 2012-09-23 – 2012-09-24 (×2): 2 via ORAL

## 2012-09-23 MED ORDER — ONDANSETRON HCL 4 MG PO TABS: 4.0000 mg | ORAL_TABLET | ORAL | Status: DC | PRN

## 2012-09-23 MED ORDER — PRENATAL MULTIVITAMIN CH
1.0000 | ORAL_TABLET | Freq: Every day | ORAL | Status: DC
  Administered 2012-09-24: 1 via ORAL
  Filled 2012-09-23 (×2): qty 1

## 2012-09-23 MED ORDER — TERBUTALINE SULFATE 1 MG/ML IJ SOLN: 0.2500 mg | Freq: Once | INTRAMUSCULAR | Status: DC | PRN

## 2012-09-23 MED ORDER — ZOLPIDEM TARTRATE 5 MG PO TABS: 5.0000 mg | ORAL_TABLET | Freq: Every evening | ORAL | Status: DC | PRN

## 2012-09-23 MED ORDER — BENZOCAINE-MENTHOL 20-0.5 % EX AERO
1.0000 "application " | INHALATION_SPRAY | CUTANEOUS | Status: DC | PRN
  Administered 2012-09-23: 1 via TOPICAL
  Filled 2012-09-23: qty 56

## 2012-09-23 MED ORDER — TETANUS-DIPHTH-ACELL PERTUSSIS 5-2.5-18.5 LF-MCG/0.5 IM SUSP: 0.5000 mL | Freq: Once | INTRAMUSCULAR | Status: DC

## 2012-09-23 MED ORDER — LANOLIN HYDROUS EX OINT: TOPICAL_OINTMENT | CUTANEOUS | Status: DC | PRN

## 2012-09-23 NOTE — Progress Notes (Signed)
   Subjective: Comfortable with epidural  Objective: VSS FHT:  FHR: 120's bpm, variability: moderate,  accelerations:  Present,  decelerations:  Absent UC:   regular, every 3-4.5 minutes SVE:   Ant lip/+1 Labs: Lab Results  Component Value Date   WBC 13.6* 09/22/2012   HGB 12.6 09/22/2012   HCT 37.3 09/22/2012   MCV 86.1 09/22/2012   PLT 141* 09/22/2012    Assessment / Plan: Augmentation of labor, progressing well  Labor: Progressing normally Preeclampsia:  n/a Fetal Wellbeing:  Category I Pain Control:  Epidural I/D:  GBS neg Anticipated MOD:  NSVD  [facility] 09/23/2012, 5:15 AM

## 2012-09-23 NOTE — Progress Notes (Signed)
Patient ID: Diana Torres, female   DOB: [birth year], 22 y.o.   MRN: [id number] Called by RN and notified that contractions have spaced out > begin low dose pitocin augmentation.

## 2012-09-23 NOTE — Progress Notes (Signed)
   Subjective: Continued comfort with epidural.    Objective: VSS FHT:  FHR: 120's bpm, variability: moderate,  accelerations:  Present,  decelerations:  Absent UC:   regular, every 2-4 minutes SVE:   9/100/0  Labs: Lab Results  Component Value Date   WBC 13.6* 09/22/2012   HGB 12.6 09/22/2012   HCT 37.3 09/22/2012   MCV 86.1 09/22/2012   PLT 141* 09/22/2012    Assessment / Plan: Augmentation of labor, progressing well  Labor: Progressing normally Preeclampsia:  n/a Fetal Wellbeing:  Category I Pain Control:  Epidural I/D:  GBS neg Anticipated MOD:  NSVD  [facility] 09/23/2012, 0100

## 2012-09-23 NOTE — Anesthesia Postprocedure Evaluation (Signed)
  Anesthesia Post-op Note  Patient: Diana Torres  Procedure(s) Performed: * No procedures listed *  Patient Location: Mother/Baby  Anesthesia Type:Epidural  Level of Consciousness: awake and alert   Airway and Oxygen Therapy: Patient Spontanous Breathing  Post-op Pain: mild  Post-op Assessment: Patient's Cardiovascular Status Stable, Respiratory Function Stable, No signs of Nausea or vomiting, Pain level controlled, No headache, No residual numbness and No residual motor weakness  Post-op Vital Signs: Reviewed and stable  Complications: No apparent anesthesia complications

## 2012-09-24 NOTE — Lactation Note (Signed)
This note was copied from the chart of Boy Kilah Purvis. Lactation Consultation Note  Patient Name: Boy Julieana Eshleman [id number] Date: 09/24/2012 Reason for consult: Follow-up assessment   Maternal Data Formula Feeding for Exclusion: No Infant to breast within first hour of birth: Yes Has patient been taught Hand Expression?: Yes Does the patient have breastfeeding experience prior to this delivery?: No  Feeding Feeding Type: Breast Milk Feeding method: Breast Length of feed: 60 min  LATCH Score/Interventions Latch: Grasps breast easily, tongue down, lips flanged, rhythmical sucking.  Audible Swallowing: A few with stimulation  Type of Nipple: Everted at rest and after stimulation  Comfort (Breast/Nipple): Filling, red/small blisters or bruises, mild/mod discomfort  Problem noted: Mild/Moderate discomfort  Hold (Positioning): Assistance needed to correctly position infant at breast and maintain latch. Intervention(s): Breastfeeding basics reviewed;Support Pillows  LATCH Score: 7  Lactation Tools Discussed/Used     Consult Status Consult Status: Follow-up Date: 09/25/12 Follow-up type: In-patient  Follow up visit with mom. She reports that he has been on and off for the last hour and that it is hurting. Assisted with deeper latch and mom reports that feels much better. Nursed for 10 minutes on the left breast. Latched well to right breast and nursed for 15 minutes, then going off to sleep. Grandmother assisted me with sign language in teaching. Reviewed wide open mouth and having the baby close to the breast.  Grandmother holding baby and mom going to try to sleep. No questions at present. To call for assist prn Pamelia Hoit 09/24/2012, 2:42 PM

## 2012-09-24 NOTE — Progress Notes (Addendum)
Post Partum Day 1 Subjective: no complaints, up ad lib, voiding, tolerating PO and + flatus Sign Language Interpretor used.  Objective: Blood pressure 122/85, pulse 87, temperature 97.8 F (36.6 C), temperature source Oral, resp. rate 16, height 5\' 3"  (1.6 m), weight 86.637 kg (191 lb), last menstrual period 12/02/2011, SpO2 98.00%, unknown if currently breastfeeding.  Physical Exam:  General: alert, cooperative and no distress Lochia: appropriate Uterine Fundus: firm Incision: healing DVT Evaluation: No evidence of DVT seen on physical exam.   Recent Labs  09/22/12 1930 09/23/12 0830  HGB 12.6 12.4  HCT 37.3 36.7    Assessment/Plan: Plan for discharge tomorrow and Breastfeeding   LOS: 2 days   [facility] 09/24/2012, 10:15 AM

## 2012-09-25 MED ORDER — IBUPROFEN 600 MG PO TABS: 600.0000 mg | ORAL_TABLET | Freq: Four times a day (QID) | ORAL | Status: DC

## 2012-09-25 MED ORDER — ACETAMINOPHEN-CODEINE 300-30 MG PO TABS: 1.0000 | ORAL_TABLET | ORAL | Status: DC | PRN

## 2012-09-25 NOTE — Discharge Summary (Signed)
Obstetric Discharge Summary Reason for Admission: induction of labor for postdates Prenatal Procedures: NST and ultrasound Intrapartum Procedures: spontaneous vaginal delivery Postpartum Procedures: none Complications-Operative and Postpartum: 2nd degree perineal laceration Hemoglobin  Date Value Range Status  09/23/2012 12.4  12.0 - 15.0 g/dL Final  29/56/2130 86.5   Final     HCT  Date Value Range Status  09/23/2012 36.7  36.0 - 46.0 % Final  03/17/2012 36   Final    Physical Exam:  Filed Vitals:   09/25/12 0628  BP: 122/72  Pulse: 75  Temp: 98 F (36.7 C)  Resp: 18  General: alert, cooperative and appears stated age Lochia: appropriate Uterine Fundus: firm Incision: n/a DVT Evaluation: No evidence of DVT seen on physical exam. Negative Homan's sign. Discharge Diagnoses: Post-date pregnancy; Normal Spontaneous Vaginal Delivery  Discharge Information: Date: 09/25/2012 Activity: pelvic rest Diet: routine Medications: Tylenol #3 and Ibuprofen Condition: stable Instructions: refer to practice specific booklet Discharge to: home Follow-up Information   Follow up with [facility] In 6 weeks.   Contact information:   [address] [place] [address]       Newborn Data: Live born female  Birth Weight: 7 lb 6 oz (3345 g) APGAR: 8, 9  Home with mother.  Pt desires OCPs, +breastfeeding.   [facility] 09/25/2012, 9:54 AM

## 2012-09-25 NOTE — H&P (Signed)
Attestation of Attending Supervision of Advanced Practitioner (CNM/NP): Evaluation and management procedures were performed by the Advanced Practitioner under my supervision and collaboration.  I have reviewed the Advanced Practitioner's note and chart, and I agree with the management and plan.  Emelia Sandoval 09/25/2012 11:52 AM   

## 2012-09-25 NOTE — Consult Note (Signed)
Mom is ready for discharge.  Family is here to use sign language for her.  Mom states baby doesn't latch consistently well and nipples are sore so she prefers to pump and bottle feed for now.  Reviewed supply and demand and importance of pumping every 3 hours for 15 minutes.  Pump referral faxed to [facility] office.  Encouraged to call [facility] or our lactation services for further concerns/assist.

## 2012-09-30 ENCOUNTER — Encounter (HOSPITAL_COMMUNITY): Payer: Self-pay

## 2012-09-30 ENCOUNTER — Inpatient Hospital Stay (HOSPITAL_COMMUNITY)
Admission: AD | Admit: 2012-09-30 | Discharge: 2012-10-03 | DRG: 776 | Disposition: A | Discharge status: Home / Self Care | Payer: Medicaid Other | Source: Ambulatory Visit | Attending: Obstetrics and Gynecology | Admitting: Obstetrics and Gynecology

## 2012-09-30 DIAGNOSIS — O8612 Endometritis following delivery: Principal | ICD-10-CM

## 2012-09-30 DIAGNOSIS — H9190 Unspecified hearing loss, unspecified ear: Secondary | ICD-10-CM

## 2012-09-30 DIAGNOSIS — K5900 Constipation, unspecified: Secondary | ICD-10-CM

## 2012-09-30 LAB — CBC WITH DIFFERENTIAL/PLATELET
HCT: 35.8 % — ABNORMAL LOW (ref 36.0–46.0)
Hemoglobin: 12.1 g/dL (ref 12.0–15.0)
Lymphocytes Relative: 8 % — ABNORMAL LOW (ref 12–46)
Lymphs Abs: 1.1 10*3/uL (ref 0.7–4.0)
MCHC: 33.8 g/dL (ref 30.0–36.0)
Monocytes Absolute: 1 10*3/uL (ref 0.1–1.0)
Monocytes Relative: 8 % (ref 3–12)
Neutro Abs: 11.1 10*3/uL — ABNORMAL HIGH (ref 1.7–7.7)
RBC: 4.23 MIL/uL (ref 3.87–5.11)
WBC: 13.3 10*3/uL — ABNORMAL HIGH (ref 4.0–10.5)

## 2012-09-30 LAB — URINALYSIS, ROUTINE W REFLEX MICROSCOPIC
Glucose, UA: NEGATIVE mg/dL
Ketones, ur: NEGATIVE mg/dL
pH: 7 (ref 5.0–8.0)

## 2012-09-30 LAB — URINE MICROSCOPIC-ADD ON

## 2012-09-30 MED ORDER — POLYETHYLENE GLYCOL 3350 17 G PO PACK
17.0000 g | PACK | Freq: Every day | ORAL | Status: DC | PRN
  Filled 2012-09-30: qty 1

## 2012-09-30 MED ORDER — ACETAMINOPHEN 500 MG PO TABS
1000.0000 mg | ORAL_TABLET | Freq: Four times a day (QID) | ORAL | Status: DC | PRN
  Administered 2012-10-01: 500 mg via ORAL
  Administered 2012-10-01 – 2012-10-03 (×3): 1000 mg via ORAL
  Filled 2012-09-30 (×3): qty 2
  Filled 2012-09-30: qty 1

## 2012-09-30 MED ORDER — DOCUSATE SODIUM 100 MG PO CAPS
100.0000 mg | ORAL_CAPSULE | Freq: Two times a day (BID) | ORAL | Status: DC
  Administered 2012-10-01 – 2012-10-03 (×6): 100 mg via ORAL
  Filled 2012-09-30 (×6): qty 1

## 2012-09-30 MED ORDER — OXYCODONE-ACETAMINOPHEN 5-325 MG PO TABS
1.0000 | ORAL_TABLET | ORAL | Status: DC | PRN
  Administered 2012-10-01: 2 via ORAL
  Administered 2012-10-02: 1 via ORAL
  Filled 2012-09-30: qty 1
  Filled 2012-09-30: qty 2

## 2012-09-30 MED ORDER — PIPERACILLIN-TAZOBACTAM 3.375 G IVPB
3.3750 g | Freq: Three times a day (TID) | INTRAVENOUS | Status: DC
  Administered 2012-10-01 – 2012-10-03 (×8): 3.375 g via INTRAVENOUS
  Filled 2012-09-30 (×10): qty 50

## 2012-09-30 MED ORDER — PRENATAL MULTIVITAMIN CH
1.0000 | ORAL_TABLET | Freq: Every day | ORAL | Status: DC
  Administered 2012-10-01 – 2012-10-03 (×3): 1 via ORAL
  Filled 2012-09-30 (×3): qty 1

## 2012-09-30 MED ORDER — FLEET ENEMA 7-19 GM/118ML RE ENEM: 1.0000 | ENEMA | Freq: Once | RECTAL | Status: AC | PRN

## 2012-09-30 MED ORDER — LACTATED RINGERS IV SOLN
INTRAVENOUS | Status: DC
  Administered 2012-10-01 – 2012-10-02 (×5): via INTRAVENOUS

## 2012-09-30 NOTE — MAU Provider Note (Signed)
History   Interpreter for hearing impaired present for history and exam  CSN: [id number]  Arrival date and time: 09/30/12 2055   None     No chief complaint on file.  HPI Diana Torres is a 22 y.o. G47P1001 female 1wk s/p term uncomplicated SVD who presents w/ report of upper abdominal sharp shooting pains and lower abdominal pain and cramping, as well as headache that began 3 days ago and has progressively worsened. She developed weakness, and a fever of 101.9 today as well as chills.  Took ibuprofen and tylenol earlier this evening w/o any relief.  Denies n/v, urinary frequency, hesitancy, urgency, or dysuria. Unsure of when last good bm was- sometime before delivery, usually has bm q 2days. Has had 2 small bm's since birth. +flatus.  Breastfeeding and pumping. Denies breast pain, erythema, hardened areas, or heat. Lochia normal, non-odorous, and not changing more than 1pad/hr, no clots. Still some mild discomfort to repaired 1st degree perineal laceration.   OB History   Grav Para Term Preterm Abortions TAB SAB Ect Mult Living   1 1 1       1       Past Medical History  Diagnosis Date  . Deaf   . Medical history non-contributory     Past Surgical History  Procedure Laterality Date  . Tonsillectomy    . Cochlear implant    . Rhinoplasty      No family history on file.  History  Substance Use Topics  . Smoking status: Never Smoker   . Smokeless tobacco: Never Used  . Alcohol Use: No    Allergies: No Known Allergies  Prescriptions prior to admission  Medication Sig Dispense Refill  . Acetaminophen-Codeine (TYLENOL/CODEINE #3) 300-30 MG per tablet Take 1 tablet by mouth every 4 (four) hours as needed for pain.  30 tablet  0  . diphenhydrAMINE (BENADRYL) 25 MG tablet Take 25 mg by mouth at bedtime as needed for allergies or sleep.      Marland Kitchen ibuprofen (ADVIL,MOTRIN) 600 MG tablet Take 1 tablet (600 mg total) by mouth every 6 (six) hours.  30 tablet  0  . Prenatal Vit-Fe  Fumarate-FA (PRENATAL MULTIVITAMIN) TABS Take 1 tablet by mouth daily at 12 noon.        Review of Systems  Constitutional: Positive for fever and chills.  Eyes: Negative.   Respiratory: Negative.   Cardiovascular: Negative.   Gastrointestinal: Positive for abdominal pain and constipation. Negative for nausea and vomiting.  Genitourinary: Negative.  Negative for dysuria, urgency, frequency, hematuria and flank pain.  Musculoskeletal: Negative.   Skin: Negative.   Neurological: Positive for weakness and headaches.  Endo/Heme/Allergies: Negative.   Psychiatric/Behavioral: Negative.    Physical Exam   Blood pressure 118/72, pulse 127, temperature 102.9 F (39.4 C), temperature source Oral, resp. rate 20, last menstrual period 12/02/2011, unknown if currently breastfeeding.  Physical Exam  Constitutional: She is oriented to person, place, and time. She appears well-developed and well-nourished.  HENT:  Head: Normocephalic.  Neck: Normal range of motion.  Cardiovascular: Regular rhythm.   tachycardic  Respiratory: Effort normal and breath sounds normal. Right breast exhibits no skin change and no tenderness. Left breast exhibits no skin change and no tenderness.  GI: Soft. Bowel sounds are normal. There is tenderness. There is no CVA tenderness.  Epigastric and uterine FF U-2  Genitourinary:  Normal non-odorous lochia  Musculoskeletal: Normal range of motion.  Neurological: She is alert and oriented to person, place, and time. She  has normal reflexes.  Skin: Skin is warm and dry.  Psychiatric: She has a normal mood and affect. Her behavior is normal. Judgment and thought content normal.    MAU Course  Procedures  CBC, UA Results for orders placed during the hospital encounter of 09/30/12 (from the past 24 hour(s))  URINALYSIS, ROUTINE W REFLEX MICROSCOPIC     Status: Abnormal   Collection Time    09/30/12  9:19 PM      Result Value Range   Color, Urine YELLOW  YELLOW    APPearance HAZY (*) CLEAR   Specific Gravity, Urine 1.015  1.005 - 1.030   pH 7.0  5.0 - 8.0   Glucose, UA NEGATIVE  NEGATIVE mg/dL   Hgb urine dipstick LARGE (*) NEGATIVE   Bilirubin Urine NEGATIVE  NEGATIVE   Ketones, ur NEGATIVE  NEGATIVE mg/dL   Protein, ur NEGATIVE  NEGATIVE mg/dL   Urobilinogen, UA 4.0 (*) 0.0 - 1.0 mg/dL   Nitrite NEGATIVE  NEGATIVE   Leukocytes, UA LARGE (*) NEGATIVE  URINE MICROSCOPIC-ADD ON     Status: Abnormal   Collection Time    09/30/12  9:19 PM      Result Value Range   Squamous Epithelial / LPF FEW (*) RARE   WBC, UA 11-20  <3 WBC/hpf   RBC / HPF TOO NUMEROUS TO COUNT  <3 RBC/hpf   Bacteria, UA FEW (*) RARE   Urine-Other MUCOUS PRESENT    CBC WITH DIFFERENTIAL     Status: Abnormal   Collection Time    09/30/12 10:03 PM      Result Value Range   WBC 13.3 (*) 4.0 - 10.5 K/uL   RBC 4.23  3.87 - 5.11 MIL/uL   Hemoglobin 12.1  12.0 - 15.0 g/dL   HCT 40.9 (*) 81.1 - 91.4 %   MCV 84.6  78.0 - 100.0 fL   MCH 28.6  26.0 - 34.0 pg   MCHC 33.8  30.0 - 36.0 g/dL   RDW 78.2  95.6 - 21.3 %   Platelets 279  150 - 400 K/uL   Neutrophils Relative % 84 (*) 43 - 77 %   Neutro Abs 11.1 (*) 1.7 - 7.7 K/uL   Lymphocytes Relative 8 (*) 12 - 46 %   Lymphs Abs 1.1  0.7 - 4.0 K/uL   Monocytes Relative 8  3 - 12 %   Monocytes Absolute 1.0  0.1 - 1.0 K/uL   Eosinophils Relative 1  0 - 5 %   Eosinophils Absolute 0.1  0.0 - 0.7 K/uL   Basophils Relative 0  0 - 1 %   Basophils Absolute 0.0  0.0 - 0.1 K/uL    Assessment and Plan  A:  7d s/p uncomplicated term SVD  PP endometritis  Constipation  Breastfeeding  P:  Consulted w/ Dr. Jolayne Panther  Admit to Women's unit  Zosyn IV   APAP, Ibuprofen, Percocet prn  Colace BID, miralax daily prn, fleets enema  Breastfeed/pump q 2-3hrs- OK for infant to room in  Marge Duncans 09/30/2012, 9:52 PM

## 2012-09-30 NOTE — MAU Note (Signed)
PT  AND BOYFRIEND ARE DEAF- FRIEND CAME WITH THEM TO SIGN.  PT DEL VAG ON 6-21-  BREAST/ BOTTLE FEEDING.  SAYS STARTED FEELING BAD TODAY,  LOWER ABD HURTS,  H/A.  TEMP AT HOME 101.9.  SHE TOOK IBUPROFEN 600MG  AT 715PM.  HAS TYLENOL # 3 - DID NOT TAKE.    SAYS HURTS TO VOID.

## 2012-10-01 DIAGNOSIS — O8612 Endometritis following delivery: Secondary | ICD-10-CM

## 2012-10-01 NOTE — H&P (Signed)
Attestation of Attending Supervision of Advanced Practitioner (CNM/NP): Evaluation and management procedures were performed by the Advanced Practitioner under my supervision and collaboration.  I have reviewed the Advanced Practitioner's note and chart, and I agree with the management and plan.  Terrius Gentile 10/01/2012 6:20 AM   

## 2012-10-01 NOTE — MAU Provider Note (Signed)
Attestation of Attending Supervision of Advanced Practitioner (CNM/NP): Evaluation and management procedures were performed by the Advanced Practitioner under my supervision and collaboration.  I have reviewed the Advanced Practitioner's note and chart, and I agree with the management and plan.  Jenay Morici 10/01/2012 6:21 AM   

## 2012-10-01 NOTE — H&P (Signed)
Please refer to MAU provider note Marge Duncans, CNM 10/01/2012

## 2012-10-01 NOTE — Progress Notes (Signed)
Patient ID: Diana Torres, female   DOB: 06/28/1990, 22 y.o.   MRN: [id number] Patient reports feeling much better in comparison to admission. Patient continues to report occasional chills. Patient is deaf and communication was via witting.  Filed Vitals:   10/01/12 0549  BP: 135/86  Pulse: 91  Temp: 99.7 F (37.6 C)  Resp: 18   GENERAL: Well-developed, well-nourished female in no acute distress.  BREASTS: Symmetric in size. No palpable masses or lymphadenopathy, skin changes, or nipple drainage. ABDOMEN: Soft, mild diffuse tenderness over lower quadrants and uterus, nondistended.  EXTREMITIES: No cyanosis, clubbing, or edema, 2+ distal pulses.'   A/P 22 yo PPD#7 admitted for postpartum endometritis - patient clinical status is improving - Continue IV antibiotics until 24-48 hours afebrile - Continue current care

## 2012-10-01 NOTE — Progress Notes (Signed)
Patient offered breast pump but has refused.

## 2012-10-02 DIAGNOSIS — O8612 Endometritis following delivery: Principal | ICD-10-CM

## 2012-10-02 LAB — URINE CULTURE

## 2012-10-02 NOTE — Progress Notes (Signed)
Ur chart review completed.  

## 2012-10-02 NOTE — Progress Notes (Signed)
Patient ID: Diana Torres, female   DOB: Oct 20, 1990, 22 y.o.   MRN: [id number]  Patient ID: Diana Torres, female DOB: [birth year], 22 y.o. MRN: [id number]   Communicated with patient via written notes as no sign language interpreter present or immediately available.  Patient reports minimal pain, only mild headache. Denies fever/chills.  Filed Vitals:   10/02/12 0553  BP: 141/84  Pulse: 81  Temp: 98.9 F (37.2 C)  Resp: 18   GEN:  WNWD, no distress HEENT:  NCAT, EOMI, conjunctiva clear CV: RRR, no murmur RESP:  CTAB ABD:  Soft, non-tender, no guarding or rebound, normal bowel sounds EXTREM:  Warm, well perfused, no edema or tenderness NEURO:  Alert, oriented, no focal deficits        A/P 22 yo PPD#9 admitted for postpartum endometritis  - Clinically improved - Last fever 10/01/12 at 12 PM (100.8)  - Continue IV antibiotics until 24-48 hours afebrile  - Continue current care      Napoleon Form, MD 10/02/2012 6:47 AM

## 2012-10-03 LAB — CBC
HCT: 37 % (ref 36.0–46.0)
Hemoglobin: 12.3 g/dL (ref 12.0–15.0)
RBC: 4.33 MIL/uL (ref 3.87–5.11)
WBC: 11.6 10*3/uL — ABNORMAL HIGH (ref 4.0–10.5)

## 2012-10-03 MED ORDER — AMOXICILLIN-POT CLAVULANATE 500-125 MG PO TABS: 1.0000 | ORAL_TABLET | Freq: Three times a day (TID) | ORAL | Status: DC

## 2012-10-03 NOTE — Discharge Summary (Signed)
Physician Discharge Summary  Patient ID: Diana Torres MRN: [id number] DOB/AGE: 1990-12-24 22 y.o.  Admit date: 09/30/2012 Discharge date: 10/03/2012  Admission Diagnoses:  Fever Postpartum day #7 Hearing impairment  Discharge Diagnoses:  Active Problems:   Postpartum endometritis   Discharged Condition: stable  Hospital Course: 22 yo G1P1001 presented 09/30/12 1 wk post uncomplicated NSVD with fever/chills reportedly 101.9 despite antipyretics. She also reported H/A x 3 days and mild constipation. She received IV hydration and Zosyn. On 10/03/12 hr T max was 100.8 at 0130, then she remained afebrile and asymptomatic.   Significant Diagnostic Studies: labs:  Results for orders placed during the hospital encounter of 09/30/12 (from the past 72 hour(s))  URINALYSIS, ROUTINE W REFLEX MICROSCOPIC     Status: Abnormal   Collection Time    09/30/12  9:19 PM      Result Value Range   Color, Urine YELLOW  YELLOW   APPearance HAZY (*) CLEAR   Specific Gravity, Urine 1.015  1.005 - 1.030   pH 7.0  5.0 - 8.0   Glucose, UA NEGATIVE  NEGATIVE mg/dL   Hgb urine dipstick LARGE (*) NEGATIVE   Bilirubin Urine NEGATIVE  NEGATIVE   Ketones, ur NEGATIVE  NEGATIVE mg/dL   Protein, ur NEGATIVE  NEGATIVE mg/dL   Urobilinogen, UA 4.0 (*) 0.0 - 1.0 mg/dL   Nitrite NEGATIVE  NEGATIVE   Leukocytes, UA LARGE (*) NEGATIVE  URINE MICROSCOPIC-ADD ON     Status: Abnormal   Collection Time    09/30/12  9:19 PM      Result Value Range   Squamous Epithelial / LPF FEW (*) RARE   WBC, UA 11-20  <3 WBC/hpf   RBC / HPF TOO NUMEROUS TO COUNT  <3 RBC/hpf   Bacteria, UA FEW (*) RARE   Urine-Other MUCOUS PRESENT    URINE CULTURE     Status: None   Collection Time    09/30/12  9:19 PM      Result Value Range   Specimen Description URINE, CLEAN CATCH     Special Requests NONE     Culture  Setup Time 10/01/2012 04:10     Colony Count 75,000 COLONIES/ML     Culture       Value: GROUP B  STREP(S.AGALACTIAE)ISOLATED     Note: TESTING AGAINST S. AGALACTIAE NOT ROUTINELY PERFORMED DUE TO PREDICTABILITY OF AMP/PEN/VAN SUSCEPTIBILITY.   Report Status 10/02/2012 FINAL    CBC WITH DIFFERENTIAL     Status: Abnormal   Collection Time    09/30/12 10:03 PM      Result Value Range   WBC 13.3 (*) 4.0 - 10.5 K/uL   RBC 4.23  3.87 - 5.11 MIL/uL   Hemoglobin 12.1  12.0 - 15.0 g/dL   HCT 46.9 (*) 62.9 - 52.8 %   MCV 84.6  78.0 - 100.0 fL   MCH 28.6  26.0 - 34.0 pg   MCHC 33.8  30.0 - 36.0 g/dL   RDW 41.3  24.4 - 01.0 %   Platelets 279  150 - 400 K/uL   Neutrophils Relative % 84 (*) 43 - 77 %   Neutro Abs 11.1 (*) 1.7 - 7.7 K/uL   Lymphocytes Relative 8 (*) 12 - 46 %   Lymphs Abs 1.1  0.7 - 4.0 K/uL   Monocytes Relative 8  3 - 12 %   Monocytes Absolute 1.0  0.1 - 1.0 K/uL   Eosinophils Relative 1  0 - 5 %   Eosinophils Absolute  0.1  0.0 - 0.7 K/uL   Basophils Relative 0  0 - 1 %   Basophils Absolute 0.0  0.0 - 0.1 K/uL  CBC     Status: Abnormal   Collection Time    10/03/12  6:45 AM      Result Value Range   WBC 11.6 (*) 4.0 - 10.5 K/uL   RBC 4.33  3.87 - 5.11 MIL/uL   Hemoglobin 12.3  12.0 - 15.0 g/dL   HCT 14.7  82.9 - 56.2 %   MCV 85.5  78.0 - 100.0 fL   MCH 28.4  26.0 - 34.0 pg   MCHC 33.2  30.0 - 36.0 g/dL   RDW 13.0  86.5 - 78.4 %   Platelets 282  150 - 400 K/uL     Blood culture pending at time of discharge .   Treatments: IV hydration and antibiotics: Zosyn  Discharge Exam: Blood pressure 122/82, pulse 65, temperature 98.3 F (36.8 C), temperature source Oral, resp. rate 16, height 5\' 3"  (1.6 m), weight 191 lb (86.637 kg), last menstrual period 12/02/2011, SpO2 98.00%, unknown if currently breastfeeding. Filed Vitals:   10/03/12 0424 10/03/12 0546 10/03/12 1000 10/03/12 1400  BP:  127/72 139/82 122/82  Pulse:  64 61 65  Temp: 97.8 F (36.6 C) 97.5 F (36.4 C) 97.8 F (36.6 C) 98.3 F (36.8 C)  TempSrc: Oral Oral Oral Oral  Resp:  16 18 16   Height:       Weight:      SpO2:  98% 98% 98%   General appearance: alert, cooperative and no distress Back: no CVAT GI: soft, non-tender; bowel sounds normal; no masses,  no organomegaly Extremities: extremities normal, atraumatic, no cyanosis or edema  Disposition: 01-Home or Self Care  Discharge Orders   Future Appointments Provider Department Dept Phone   10/27/2012 8:45 AM Lesly Dukes, MD [facility] [phone]   Future Orders Complete By Expires     Discharge patient  As directed         Medication List    STOP taking these medications       Acetaminophen-Codeine 300-30 MG per tablet  Commonly known as:  TYLENOL/CODEINE #3      TAKE these medications       amoxicillin-clavulanate 500-125 MG per tablet  Commonly known as:  AUGMENTIN  Take 1 tablet (500 mg total) by mouth 3 (three) times daily.     ibuprofen 600 MG tablet  Commonly known as:  ADVIL,MOTRIN  Take 1 tablet (600 mg total) by mouth every 6 (six) hours.     prenatal multivitamin Tabs  Take 1 tablet by mouth daily at 12 noon.           Follow-up Information   Follow up with [facility] On 10/27/2012.      Signed: POE,DEIRDRE 10/03/2012, 3:22 PM POC D/W Dr. Shawnie Pons

## 2012-10-03 NOTE — Progress Notes (Signed)
Pt. Is discharged in the care of husband. Downstairs per ambulatory. Discharge instructions were given to pt with good understanding. Questions were asked and answered. Pt and husband communicated through writing and text on cell phone. Nurse and doctor gave handouts on home care instructions as well as endometritis education information. Pt and husband read over all material given and both acknowledged that they understood what was given to them.

## 2012-10-03 NOTE — Progress Notes (Addendum)
Patient ID: Diana Torres, female   DOB: [birth year], 22 y.o.   MRN: [id number]  Patient ID: Diana Torres, female DOB: 16-Feb-1991, 22 y.o. MRN: [id number]   Communicated with patient via written notes as no sign language interpreter present or immediately available.  Patient reports minimal pain, only mild headache. Denies fever/chills or breast engorgement. Patient reports feeling well and desires to go home. She is breast feeding and uses a breast pump occasionally.  Filed Vitals:   10/03/12 0546  BP: 127/72  Pulse: 64  Temp: 97.5 F (36.4 C)  Resp: 16   GEN:  WNWD, no distress RESP:  CTAB BREAST: soft, nontender, no palpable milk duct, no breast engorgement ABD:  Soft, non-tender, no guarding or rebound, normal bowel sounds EXTREM:  Warm, well perfused, no edema or tenderness NEURO:  Alert, oriented, no focal deficits        A/P 22 yo PPD#10 admitted for postpartum endometritis  - Clinically improved - Last fever 10/03/12 at 1 AM (100.8)  - Continue IV antibiotics  - CBC and blood cultures ordered - Patient has a strong desire to go home. Will discuss with partner, discharge home with 7-day course of Augmentin, pending WBC  - Continue current care      Catalina Antigua, MD  10/03/2012 6:40 AM  Addendum No fever/chills, abdominal or breast pain. On Zosyn. Remains afebrile. Blood culture is pending. WBC11.6 this am (down from 13.3). D/W Dr. Shawnie Pons. Will discharge home per POC above. Danae Orleans, CNM 10/03/2012 3:16 PM

## 2012-10-09 LAB — CULTURE, BLOOD (ROUTINE X 2): Culture: NO GROWTH

## 2012-10-27 ENCOUNTER — Ambulatory Visit: Admitting: Family Medicine

## 2012-11-09 ENCOUNTER — Encounter: Payer: Self-pay | Admitting: Obstetrics and Gynecology

## 2012-11-09 ENCOUNTER — Ambulatory Visit (INDEPENDENT_AMBULATORY_CARE_PROVIDER_SITE_OTHER): Admitting: Obstetrics and Gynecology

## 2012-11-09 VITALS — BP 109/78 | HR 79 | Temp 97.2°F | Ht 63.0 in | Wt 163.8 lb

## 2012-11-09 DIAGNOSIS — Z3049 Encounter for surveillance of other contraceptives: Secondary | ICD-10-CM

## 2012-11-09 MED ORDER — MEDROXYPROGESTERONE ACETATE 150 MG/ML IM SUSP
150.0000 mg | Freq: Once | INTRAMUSCULAR | Status: AC
  Administered 2012-11-09: 150 mg via INTRAMUSCULAR

## 2012-11-09 NOTE — Progress Notes (Deleted)
  Subjective:    Patient ID: Diana Torres, female    DOB: [birth year], 22 y.o.   MRN: [id number]  HPI    Review of Systems     Objective:   Physical Exam        Assessment & Plan:

## 2012-11-09 NOTE — Patient Instructions (Signed)
Contraception Choices  Contraception (birth control) is the use of any methods or devices to prevent pregnancy. Below are some methods to help avoid pregnancy.  HORMONAL METHODS   · Contraceptive implant. This is a thin, plastic tube containing progesterone hormone. It does not contain estrogen hormone. Your caregiver inserts the tube in the inner part of the upper arm. The tube can remain in place for up to 3 years. After 3 years, the implant must be removed. The implant prevents the ovaries from releasing an egg (ovulation), thickens the cervical mucus which prevents sperm from entering the uterus, and thins the lining of the inside of the uterus.  · Progesterone-only injections. These injections are given every 3 months by your caregiver to prevent pregnancy. This synthetic progesterone hormone stops the ovaries from releasing eggs. It also thickens cervical mucus and changes the uterine lining. This makes it harder for sperm to survive in the uterus.  · Birth control pills. These pills contain estrogen and progesterone hormone. They work by stopping the egg from forming in the ovary (ovulation). Birth control pills are prescribed by a caregiver. Birth control pills can also be used to treat heavy periods.  · Minipill. This type of birth control pill contains only the progesterone hormone. They are taken every day of each month and must be prescribed by your caregiver.  · Birth control patch. The patch contains hormones similar to those in birth control pills. It must be changed once a week and is prescribed by a caregiver.  · Vaginal ring. The ring contains hormones similar to those in birth control pills. It is left in the vagina for 3 weeks, removed for 1 week, and then a new one is put back in place. The patient must be comfortable inserting and removing the ring from the vagina. A caregiver's prescription is necessary.  · Emergency contraception. Emergency contraceptives prevent pregnancy after unprotected  sexual intercourse. This pill can be taken right after sex or up to 5 days after unprotected sex. It is most effective the sooner you take the pills after having sexual intercourse. Emergency contraceptive pills are available without a prescription. Check with your pharmacist. Do not use emergency contraception as your only form of birth control.  BARRIER METHODS   · Female condom. This is a thin sheath (latex or rubber) that is worn over the penis during sexual intercourse. It can be used with spermicide to increase effectiveness.  · Female condom. This is a soft, loose-fitting sheath that is put into the vagina before sexual intercourse.  · Diaphragm. This is a soft, latex, dome-shaped barrier that must be fitted by a caregiver. It is inserted into the vagina, along with a spermicidal jelly. It is inserted before intercourse. The diaphragm should be left in the vagina for 6 to 8 hours after intercourse.  · Cervical cap. This is a round, soft, latex or plastic cup that fits over the cervix and must be fitted by a caregiver. The cap can be left in place for up to 48 hours after intercourse.  · Sponge. This is a soft, circular piece of polyurethane foam. The sponge has spermicide in it. It is inserted into the vagina after wetting it and before sexual intercourse.  · Spermicides. These are chemicals that kill or block sperm from entering the cervix and uterus. They come in the form of creams, jellies, suppositories, foam, or tablets. They do not require a prescription. They are inserted into the vagina with an applicator before having sexual intercourse.   The process must be repeated every time you have sexual intercourse.  INTRAUTERINE CONTRACEPTION  · Intrauterine device (IUD). This is a T-shaped device that is put in a woman's uterus during a menstrual period to prevent pregnancy. There are 2 types:  · Copper IUD. This type of IUD is wrapped in copper wire and is placed inside the uterus. Copper makes the uterus and  fallopian tubes produce a fluid that kills sperm. It can stay in place for 10 years.  · Hormone IUD. This type of IUD contains the hormone progestin (synthetic progesterone). The hormone thickens the cervical mucus and prevents sperm from entering the uterus, and it also thins the uterine lining to prevent implantation of a fertilized egg. The hormone can weaken or kill the sperm that get into the uterus. It can stay in place for 5 years.  PERMANENT METHODS OF CONTRACEPTION  · Female tubal ligation. This is when the woman's fallopian tubes are surgically sealed, tied, or blocked to prevent the egg from traveling to the uterus.  · Female sterilization. This is when the female has the tubes that carry sperm tied off (vasectomy). This blocks sperm from entering the vagina during sexual intercourse. After the procedure, the man can still ejaculate fluid (semen).  NATURAL PLANNING METHODS  · Natural family planning. This is not having sexual intercourse or using a barrier method (condom, diaphragm, cervical cap) on days the woman could become pregnant.  · Calendar method. This is keeping track of the length of each menstrual cycle and identifying when you are fertile.  · Ovulation method. This is avoiding sexual intercourse during ovulation.  · Symptothermal method. This is avoiding sexual intercourse during ovulation, using a thermometer and ovulation symptoms.  · Post-ovulation method. This is timing sexual intercourse after you have ovulated.  Regardless of which type or method of contraception you choose, it is important that you use condoms to protect against the transmission of sexually transmitted diseases (STDs). Talk with your caregiver about which form of contraception is most appropriate for you.  Document Released: 03/22/2005 Document Revised: 06/14/2011 Document Reviewed: 07/29/2010  [employer]® Patient Information ©2014 [employer].

## 2012-11-09 NOTE — Progress Notes (Signed)
Patient ID: Diana Torres, female   DOB: [birth year], 22 y.o.   MRN: [id number]  Subjective:    She is a 22 y.o. female who presents for a postpartum visit. She is 2 months postpartum following a spontaneous vaginal delivery. I have fully reviewed the prenatal and intrapartum course. The delivery was at 41 gestational weeks. Outcome: spontaneous vaginal delivery. Anesthesia: epidural. Postpartum course has been K. Diarrhea admission and one week postpartum for endometritis treated with Zosyn. She did have a urine culture that grew out beta strep.. Baby's course has been complicated. Baby is feeding by bottle. Bleeding lochia stopped after a few weeks and she had a menstrual period which began 3 days ago. Bowel function is normal. Bladder function is normal. Patient is not sexually active. Contraception method is abstinence. Postpartum depression screening: negative.  The following portions of the patient's history were reviewed and updated as appropriate: allergies, current medications, past family history, past medical history, past social history, past surgical history and problem list.  Review of Systems Pertinent items are noted in HPI.   Objective:    BP 101/67  Pulse 69  Temp(Src) 97.7 F (36.5 C) (Oral)  Ht 4\' 10"  (1.473 m)  Wt 162 lb 3.2 oz (73.573 kg)  BMI 33.91 kg/m2  Breastfeeding? Yes  General:  alert, cooperative and no distress   Breasts:  inspection negative, no nipple discharge or bleeding, no masses or nodularity palpable  Lungs: clear to auscultation bilaterally  Heart:  regular rate and rhythm, S1, S2 normal, no murmur, click, rub or gallop  Abdomen: soft, nontender, uterus well involuted. 2 cm diastasis rectus   Assessment:     8 wk postpartum exam. Pap smear not done at today's visit.   Plan:    1. Contraception: Depo-Provera injections 2. Birth Control Options reviewed. She opted for Depo-Provera due to improved efficacy of her OCPs. 3. Follow up in: 3 months  for Depo and Pap (result not found on PN record) or as needed.

## 2013-01-26 ENCOUNTER — Ambulatory Visit: Payer: Medicaid Other

## 2013-02-01 ENCOUNTER — Ambulatory Visit: Payer: Medicaid Other

## 2013-03-05 ENCOUNTER — Ambulatory Visit (INDEPENDENT_AMBULATORY_CARE_PROVIDER_SITE_OTHER): Payer: Medicaid Other | Admitting: Medical

## 2013-03-05 ENCOUNTER — Encounter: Payer: Self-pay | Admitting: Medical

## 2013-03-05 VITALS — BP 111/73 | HR 72 | Temp 96.0°F | Ht 63.0 in | Wt 167.5 lb

## 2013-03-05 DIAGNOSIS — Z3009 Encounter for other general counseling and advice on contraception: Secondary | ICD-10-CM

## 2013-03-05 DIAGNOSIS — Z01812 Encounter for preprocedural laboratory examination: Secondary | ICD-10-CM

## 2013-03-05 NOTE — Patient Instructions (Signed)

## 2013-03-05 NOTE — Progress Notes (Signed)
Patient ID: Diana Torres, female   DOB: June 17, 1990, 22 y.o.   MRN: [id number]  History:  Diana Torres is a 22 y.o. G1P1001 who presents to clinic today for birth control counseling. The patient was started on Depo Provera postpartum. She received an injection in August 2014 and missed her next scheduled dose in October. The patient denies using anything for birth control in the meantime. She does not use condoms. She states that her husband "pulls out" prior to ejaculation. LMP was 02/26/13. Last intercourse was yesterday. The patient is interested in started OCPs for birth control. She has a list of medications that she does not want. She states that she wants something "clean and safe." The patient denies any other problems today.   The following portions of the patient's history were reviewed and updated as appropriate: allergies, current medications, past family history, past medical history, past social history, past surgical history and problem list.  Review of Systems:  Pertinent items are noted in HPI.  Objective:  Physical Exam BP 111/73  Pulse 72  Temp(Src) 96 F (35.6 C) (Oral)  Ht 5\' 3"  (1.6 m)  Wt 167 lb 8 oz (75.978 kg)  BMI 29.68 kg/m2  LMP 02/26/2013  Breastfeeding? No GENERAL: Well-developed, well-nourished female in no acute distress.  HEENT: Normocephalic, atraumatic.   LUNGS: Effort normal HEART: Regular rate  SKIN: Warm, dry and without erythema PSYCH: Normal mood and affect  Labs and Imaging UPT - negative  Assessment & Plan:  Assessment: Birth Control Counseling  Plans: 1. UPT today - negative 2. Patient to return to [facility] clinic in 2 weeks for next UPT. Patient instructed to abstain from intercourse or use condoms every time between now and the next UPT 3. Will send Rx for Sprintec with refills x 11 if UPT is negative in 2 weeks 4. Patient may return to [facility] clinic in 3 months if she is unhappy with the side effects of the OCPs or in 1 year for annual  exam and refill of OCPs  Freddi Starr, PA-C 03/05/2013 1:26 PM

## 2013-03-19 ENCOUNTER — Ambulatory Visit (INDEPENDENT_AMBULATORY_CARE_PROVIDER_SITE_OTHER): Payer: Medicaid Other

## 2013-03-19 DIAGNOSIS — Z3202 Encounter for pregnancy test, result negative: Secondary | ICD-10-CM

## 2013-03-19 LAB — POCT PREGNANCY, URINE: Preg Test, Ur: NEGATIVE

## 2013-03-19 MED ORDER — NORGESTIMATE-ETH ESTRADIOL 0.25-35 MG-MCG PO TABS: 1.0000 | ORAL_TABLET | Freq: Every day | ORAL | Status: DC

## 2013-03-19 NOTE — Progress Notes (Signed)
Pt came in today for second pregnancy test per [place], [place] in order to start OCP's.  Pregnancy test negative and e-prescribed BCP's according to [facility] last office note, Sprintec x 11 refills.  Per Dr. Macon Large pt can start taking the pill today.  I advised pt to use back up method for at least a week to prevent pregnancy.

## 2013-03-23 ENCOUNTER — Encounter (HOSPITAL_COMMUNITY): Payer: Self-pay | Admitting: Emergency Medicine

## 2013-03-23 ENCOUNTER — Emergency Department (HOSPITAL_COMMUNITY)
Admission: EM | Admit: 2013-03-23 | Discharge: 2013-03-23 | Disposition: A | Discharge status: Home / Self Care | Payer: Medicaid Other | Attending: Emergency Medicine | Admitting: Emergency Medicine

## 2013-03-23 ENCOUNTER — Emergency Department (HOSPITAL_COMMUNITY): Payer: Medicaid Other

## 2013-03-23 DIAGNOSIS — Z8669 Personal history of other diseases of the nervous system and sense organs: Secondary | ICD-10-CM | POA: Insufficient documentation

## 2013-03-23 DIAGNOSIS — M25579 Pain in unspecified ankle and joints of unspecified foot: Secondary | ICD-10-CM | POA: Insufficient documentation

## 2013-03-23 DIAGNOSIS — R229 Localized swelling, mass and lump, unspecified: Secondary | ICD-10-CM | POA: Insufficient documentation

## 2013-03-23 DIAGNOSIS — M7989 Other specified soft tissue disorders: Secondary | ICD-10-CM | POA: Insufficient documentation

## 2013-03-23 DIAGNOSIS — M79671 Pain in right foot: Secondary | ICD-10-CM

## 2013-03-23 DIAGNOSIS — R209 Unspecified disturbances of skin sensation: Secondary | ICD-10-CM | POA: Insufficient documentation

## 2013-03-23 DIAGNOSIS — Z79899 Other long term (current) drug therapy: Secondary | ICD-10-CM | POA: Insufficient documentation

## 2013-03-23 MED ORDER — HYDROCODONE-ACETAMINOPHEN 5-325 MG PO TABS
1.0000 | ORAL_TABLET | Freq: Once | ORAL | Status: AC
  Administered 2013-03-23: 1 via ORAL
  Filled 2013-03-23: qty 1

## 2013-03-23 MED ORDER — HYDROCODONE-ACETAMINOPHEN 5-325 MG PO TABS: 1.0000 | ORAL_TABLET | Freq: Four times a day (QID) | ORAL | Status: DC | PRN

## 2013-03-23 NOTE — ED Notes (Signed)
Father is interpreter due to hearing impairment. Pt reports 3 month hx of swelling in r/foot, nodule in r/foot is recurrent. Initial incident 2 years ago.Father reported that  I and D, released clear liquid. Last 24 hrs pt reports increased numbness in r/calf

## 2013-03-23 NOTE — ED Provider Notes (Signed)
CSN: [id number]     Arrival date & time 03/23/13  1244 History  This chart was scribed for non-physician practitioner working with Flint Melter, MD by Ronal Fear, ED scribe. This patient was seen in room WTR6/WTR6 and the patient's care was started at 1:43 PM.    Chief Complaint  Patient presents with  . Leg Swelling    increased pain in foot and leg x 24 hrs  . Foot Pain    3 month hx of swelling in r/foot  . Numbness    numbness in r/calf   (Consider location/radiation/quality/duration/timing/severity/associated sxs/prior Treatment) The history is provided by the patient and a parent. No language interpreter was used.   HPI Comments: Diana Torres is a 22 y.o. female who presents to the Emergency Department complaining of foot swelling and 7/10 foot pain with an obvious bump to the top of her right foot. She states that she injured it playing soccer years ago and has had an intermittent cyst to the top of her right foot since that time. She reports that the cyst returned approximately three months ago and is gradually increasing in size.   She reports that the pain has been gradually increasing over the past couple of days.  She has not taken anything for the pain prior to arrival.  She reports that she has seen her PCP about this in the past and was referred to a specialist.  She is unsure of the name of the physician that she had seen and is unsure what type of specialist it was.  However, she reports that surgery was recommended at that time.  She denies any acute injury or trauma.  Denies any erythema of the foot.  Denies fever or chills.  Denies numbness or tingling.     Past Medical History  Diagnosis Date  . Deaf   . Medical history non-contributory    Past Surgical History  Procedure Laterality Date  . Tonsillectomy    . Cochlear implant    . Rhinoplasty     History reviewed. No pertinent family history. History  Substance Use Topics  . Smoking status: Never Smoker    . Smokeless tobacco: Never Used  . Alcohol Use: No   OB History   Grav Para Term Preterm Abortions TAB SAB Ect Mult Living   1 1 1       1      Review of Systems  Musculoskeletal: Positive for arthralgias.  All other systems reviewed and are negative.    Allergies  Review of patient's allergies indicates no known allergies.  Home Medications   Current Outpatient Rx  Name  Route  Sig  Dispense  Refill  . norgestimate-ethinyl estradiol (ORTHO-CYCLEN,SPRINTEC,PREVIFEM) 0.25-35 MG-MCG tablet   Oral   Take 1 tablet by mouth daily.   1 Package   11    BP 113/68  Pulse 57  Temp(Src) 98.3 F (36.8 C) (Oral)  Resp 18  SpO2 99%  LMP 02/26/2013  Breastfeeding? No Physical Exam  Nursing note and vitals reviewed. Constitutional: She is oriented to person, place, and time. She appears well-developed and well-nourished. No distress.  HENT:  Head: Normocephalic and atraumatic.  Eyes: EOM are normal.  Neck: Neck supple. No tracheal deviation present.  Cardiovascular: Normal rate, regular rhythm and normal heart sounds.   Pulses:      Dorsalis pedis pulses are 2+ on the right side, and 2+ on the left side.  Pulmonary/Chest: Effort normal and breath sounds normal. No respiratory  distress.  Musculoskeletal: Normal range of motion.  5 cm in diameter round cystic structure to dorsal aspect of foot. No erythema or warmth.    Neurological: She is alert and oriented to person, place, and time. No sensory deficit.  Skin: Skin is warm and dry.  Psychiatric: She has a normal mood and affect. Her behavior is normal.    ED Course  Procedures (including critical care time) DIAGNOSTIC STUDIES: Oxygen Saturation is 99% on RA, normal by my interpretation.    COORDINATION OF CARE:  1:48 PM- Pt advised of plan for treatment including follow up with the doctor she saw in the past or pain medication and pt agrees.  Labs Review Labs Reviewed - No data to display Imaging Review No results  found.  EKG Interpretation   None       MDM  No diagnosis found. Patient presenting with a cystic structure to the dorsal aspect of the right foot.  She reports that it has been recurrent over the past few years.  She has seen a specialist for this in the past.  No signs of infection at this time.  Patient is able to ambulate. Neurovascularly intact.  Feel that the patient is stable for discharge.  Patient instructed to follow up with the specialist that she has seen in the past.  Return precautions given.  I personally performed the services described in this documentation, which was scribed in my presence. The recorded information has been reviewed and is accurate.    Santiago Glad, PA-C 03/24/13 2312

## 2013-03-25 NOTE — ED Provider Notes (Signed)
Medical screening examination/treatment/procedure(s) were performed by non-physician practitioner and as supervising physician I was immediately available for consultation/collaboration.  Flint Melter, MD 03/25/13 [phone]

## 2013-05-12 ENCOUNTER — Emergency Department (HOSPITAL_COMMUNITY)
Admission: EM | Admit: 2013-05-12 | Discharge: 2013-05-13 | Disposition: A | Discharge status: Home / Self Care | Payer: Medicaid Other | Attending: Emergency Medicine | Admitting: Emergency Medicine

## 2013-05-12 ENCOUNTER — Encounter (HOSPITAL_COMMUNITY): Payer: Self-pay | Admitting: Emergency Medicine

## 2013-05-12 DIAGNOSIS — H6090 Unspecified otitis externa, unspecified ear: Secondary | ICD-10-CM

## 2013-05-12 DIAGNOSIS — Z9889 Other specified postprocedural states: Secondary | ICD-10-CM | POA: Insufficient documentation

## 2013-05-12 DIAGNOSIS — H9190 Unspecified hearing loss, unspecified ear: Secondary | ICD-10-CM | POA: Insufficient documentation

## 2013-05-12 DIAGNOSIS — H6690 Otitis media, unspecified, unspecified ear: Secondary | ICD-10-CM | POA: Insufficient documentation

## 2013-05-12 DIAGNOSIS — H9202 Otalgia, left ear: Secondary | ICD-10-CM

## 2013-05-12 DIAGNOSIS — H60399 Other infective otitis externa, unspecified ear: Secondary | ICD-10-CM | POA: Insufficient documentation

## 2013-05-12 DIAGNOSIS — J329 Chronic sinusitis, unspecified: Secondary | ICD-10-CM | POA: Insufficient documentation

## 2013-05-12 DIAGNOSIS — Z3202 Encounter for pregnancy test, result negative: Secondary | ICD-10-CM | POA: Insufficient documentation

## 2013-05-12 NOTE — ED Notes (Signed)
Pt c/o L ear, jaw and head pain. 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

## 2013-05-13 ENCOUNTER — Emergency Department (HOSPITAL_COMMUNITY): Payer: Medicaid Other

## 2013-05-13 LAB — POCT I-STAT, CHEM 8
BUN: 10 mg/dL (ref 6–23)
CALCIUM ION: 1.31 mmol/L — AB (ref 1.12–1.23)
CREATININE: 0.7 mg/dL (ref 0.50–1.10)
Chloride: 102 mEq/L (ref 96–112)
GLUCOSE: 92 mg/dL (ref 70–99)
HCT: 42 % (ref 36.0–46.0)
HEMOGLOBIN: 14.3 g/dL (ref 12.0–15.0)
Potassium: 3.9 mEq/L (ref 3.7–5.3)
Sodium: 141 mEq/L (ref 137–147)
TCO2: 29 mmol/L (ref 0–100)

## 2013-05-13 LAB — POCT PREGNANCY, URINE: Preg Test, Ur: NEGATIVE

## 2013-05-13 MED ORDER — NEOMYCIN-POLYMYXIN-HC 3.5-10000-1 OT SUSP: 4.0000 [drp] | Freq: Four times a day (QID) | OTIC | Status: DC

## 2013-05-13 MED ORDER — IOHEXOL 300 MG/ML  SOLN
100.0000 mL | Freq: Once | INTRAMUSCULAR | Status: AC | PRN
  Administered 2013-05-13: 100 mL via INTRAVENOUS

## 2013-05-13 MED ORDER — AMOXICILLIN 500 MG PO TABS: 1000.0000 mg | ORAL_TABLET | Freq: Two times a day (BID) | ORAL | Status: DC

## 2013-05-13 NOTE — Discharge Instructions (Signed)
Follow up with your Primary care provider in 2-3 days to ensure symptom improvement. Refer to resource guide for follow up reference. Take medications as directed. Return to ED should your symptoms worsen.    Emergency Department Resource Guide 1) Find a Doctor and Pay Out of Pocket Although you won't have to find out who is covered by your insurance plan, it is a good idea to ask around and get recommendations. You will then need to call the office and see if the doctor you have chosen will accept you as a new patient and what types of options they offer for patients who are self-pay. Some doctors offer discounts or will set up payment plans for their patients who do not have insurance, but you will need to ask so you aren't surprised when you get to your appointment.  2) Contact Your Local Health Department Not all health departments have doctors that can see patients for sick visits, but many do, so it is worth a call to see if yours does. If you don't know where your local health department is, you can check in your phone book. The CDC also has a tool to help you locate your state's health department, and many state websites also have listings of all of their local health departments.  3) Find a [facility] If your illness is not likely to be very severe or complicated, you may want to try a walk in clinic. These are popping up all over the country in pharmacies, drugstores, and shopping centers. They're usually staffed by nurse practitioners or physician assistants that have been trained to treat common illnesses and complaints. They're usually fairly quick and inexpensive. However, if you have serious medical issues or chronic medical problems, these are probably not your best option.  No Primary Care Doctor: - Call Health Connect at  [phone] - they can help you locate a primary care doctor that  accepts your insurance, provides certain services, etc. - Physician Referral Service-  [phone]  Chronic Pain Problems: Organization         Address  Phone   Notes  [facility]  [phone] Patients need to be referred by their primary care doctor.   Medication Assistance: Organization         Address  Phone   Notes  [facility] Medication [facility] [address] [place], [place] [address] [phone] --Must be a resident of [facility] -- Must have NO insurance coverage whatsoever (no Medicaid/ Medicare, etc.) -- The pt. MUST have a primary care doctor that directs their care regularly and follows them in the community   MedAssist  [phone]   [employer]  [phone]    Agencies that provide inexpensive medical care: Organization         Address  Phone   Notes  [employer]  [phone]   [facility]    [phone]   [facility] [place], [place] [address] [phone]   [place] [address] [place] [phone]   Planned Parenthood    [phone]   [facility]    [phone]   [place] and [address], [place] Phone:  [phone], Fax:  [phone] Hours of Operation:  9 am - 6 pm, M-F.  Also accepts Medicaid/Medicare and self-pay.  [facility]  [address], [place] Phone: [phone], Fax: [phone]. Hours of Operation:  8:30 am - 5:30 pm, M-F.  Also accepts Medicaid and self-pay.  [facility] [place] [address] [place] Phone: [phone]   [place], [place], [place] [phone], Ext. [phone] Mondays & Thursdays: 7-9 AM.  First 15 patients are seen on a first come, first serve basis.    [place] Providers:  Organization         Address  Phone   Notes  [facility] [address],  [place] [phone] Also accepts self-pay patients.  [facility] [id number] G. [address]. Garcia, [place]  [phone]   [place], [address], [place] [phone]   [facility] [address] [place] [phone]   Lucianne Lei [address], [place]   [phone] Only accepts [place] Access [place] patients after they have their name applied to their card.   Self-Pay (no insurance) in [facility]:  Organization         Address  Phone   Notes  Sickle Cell Patients, [facility] Internal Medicine [place] [phone]   [facility] Urgent Care [place] [phone]   [facility] [place]  [place], [place], [place] [phone]   [facility]/Dr. Osei-Bonsu  [address] [place] or [address], [place] [phone] Phone number for both [place] and [place] locations is the same.  Urgent Medical and [facility] [address] [place] [phone]   [facility] [address] [place] or [address] [place] [address] [phone]   [facility] [address] [place] [phone], phone; [phone], fax Sees patients 1st and 3rd Saturday of every month.  Must not qualify for public or private insurance (i.e. Medicaid, Medicare, [place] Health Choice, Veterans' Benefits)  Household income should be no more than 200% of the poverty level The clinic cannot treat you if you are pregnant or think you are pregnant  Sexually transmitted diseases are not treated at the clinic.    Dental Care: Organization         Address  Phone  Notes  [facility] Department of [facility] [phone] Accepts children up to age 64 who are enrolled in  [place] or [place]; pregnant women with a Medicaid card; and children who have applied for Medicaid or [place] Health Choice, but were declined, whose parents can pay a reduced fee at time of service.  [facility] Department of [facility]  [address], [place] [phone] Accepts children up to age 59 who are enrolled in [place] or [name]; pregnant women with a Medicaid card; and children who have applied for Medicaid or [place] Health Choice, but were declined, whose parents can pay a reduced fee at time of service.  [facility] Adult [facility] Access PROGRAM  [place] [phone] Patients are seen by appointment only. Walk-ins are not accepted. [place] will see patients 70 years of age and older. Monday - Tuesday (8am-5pm) Most Wednesdays (8:30-5pm) $30 per visit, cash only  [place] Adult [facility] Access PRO[facility]  [address], [facility] [phone] Patients are seen by appointment only. Walk-ins are not accepted. [place] will see patients 55 years of age and older. One Wednesday Evening (Monthly: Volunteer Based).  $30 per visit, cash only  [place]  [phone] for adults; Children under age 16, call Graduate Pediatric Dentistry at [phone]. Children aged 15-14, please call [phone] to request a pediatric application.  Dental services are provided in all areas of dental care including fillings, crowns and bridges, complete and partial dentures, implants, gum treatment, root canals, and extractions. Preventive care is also provided. Treatment is provided to both adults and children. Patients are selected via a lottery and there is often a waiting list.   [facility] [address] [place]  [phone] [email or web address]   [facility] [address] [place], [place] [phone], Ext. [phone] Second and Fourth Thursday of each month, opens at 6:30  AM; Clinic ends at 9 AM.  Patients are seen on a first-come first-served basis, and a limited number are seen during each clinic.   [name]  [address] [place] D[place]er [place], [place] [phone]   Eligibility Requirements You must have lived in [place], [place], or [place] counties for at least the last three months.   You cannot be eligible for state or federal sponsored [employer], including [employer], [place], or [employer].   You generally cannot be eligible for healthcare insurance through your employer.    How to apply: Eligibility screenings are held every Tuesday and Wednesday afternoon from 1:00 pm until 4:00 pm. You do not need an appointment for the interview!  [facility] [address] [place], [place]   [employer]  [place] Department  [place]  [phone]    Behavioral Health Resources in the Community: Intensive Outpatient Programs Organization         Address  Phone  Notes  [place]. [address], [place], [place] [phone]   [facility] [address], [place], [employer]   ADS: Alcohol & Drug Svcs [address], [place], [place]   [place] [address]  [place], [place] or [phone]   Substance Abuse Resources Organization         Address  Phone  Notes  Alcohol and Drug Services  [phone]   [place]  [phone]   The [place]   Chinita Pester  [phone]   Residential & Outpatient Substance Abuse Program  [phone]   Psychological Services Organization         Address  Phone  Notes  [name] [place]  [place]  [phone]   [place] [address] [place] [phone] or  [phone]    Mobile Crisis Teams Organization         Address  Phone  Notes  [facility], Mobile Crisis Care Unit  [phone]   [facility] Psychotherapeutic Services  [address] [place], [place]   [place] [address] [place] [phone]    Self-Help/Support Groups Organization         Address  Phone             Notes  Mental Health Assoc. of [place] - variety of support groups  [place] Call for more information  Narcotics Anonymous (NA), Caring Services [address], [employer] [place]  2 meetings at this location   Special educational needs teacher         Address  Phone  Notes  [facility] [place],    [place]  [phone]   [facility]  [address], [place] [address], [place], [place]   [place], [place] [phone] Admissions: 8am-3pm M-F  Incentives Substance [place] [address],    [place], [place] [phone]   [facility] [address] [place], [place], [place]   [facility] [address],  [place], [place]   [facility] - Intensive Outpatient [address], [place], [place]   [facility] ([place].) [place].,  [place], [place] [phone] or [phone]   [facility] (RTS) [address] [place], [place] Accepts Medicaid  [facility] [name] [address]  [place] [phone] Substance Abuse/Addiction Treatment   [facility] Organization         Address  Phone  Notes  [facility]  [phone]   Domenic Schwab, PhD [address] Arlis [place], [place]   [phone] or [phone]   [place], [place] [phone]   [facility] [address],  [place], [place] [phone] Insurance/Medicaid/sponsorship through [facility] and Families [address], [address]                                    [place], [place] [phone] [place] [address], [place] [phone]    Dr. Adele Schilder  [phone]   [facility] Dept. 1) [address] [place] 2) [place] 3)  [place] [address], [place] [phone]  [phone]   [place] [phone] or [phone] (After Hours)

## 2013-05-13 NOTE — ED Provider Notes (Signed)
CSN: [id number]     Arrival date & time 05/12/13  2229 History   First MD Initiated Contact with Patient 05/13/13 0001     Chief Complaint  Patient presents with  . Otalgia   (Consider location/radiation/quality/duration/timing/severity/associated sxs/prior Treatment) Patient is a 23 y.o. female presenting with ear pain. The history is provided by the patient. The history is limited by a language barrier. A language interpreter was used.  Otalgia Associated symptoms: rhinorrhea    23 yo female presents with LEFT ear pain that started this morning. Patient is deaf, and translator is present for interview. Pain is dull, throbbing "inside my ear". "When I yawn and swallow, my left jaw hurts". Jaw pain localized to LEFT side. Patient admits to pain with manipulation of LEFT ear. Admits to associated frontal HA. Denies fever/chills, neck pain/stiffness, N/V. Patient states she had similar episode 2 years ago that improved with antibiotics. 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 Denies any additional medical conditions or medications.   Past Medical History  Diagnosis Date  . Deaf   . Medical history non-contributory    Past Surgical History  Procedure Laterality Date  . Tonsillectomy    . Cochlear implant    . Rhinoplasty     No family history on file. History  Substance Use Topics  . Smoking status: Never Smoker   . Smokeless tobacco: Never Used  . Alcohol Use: No   OB History   Grav Para Term Preterm Abortions TAB SAB Ect Mult Living   1 1 1       1      Review of Systems  HENT: Positive for ear pain, rhinorrhea and sinus pressure.   All other systems reviewed and are negative.    Allergies  Codeine  Home Medications   Current Outpatient Rx  Name  Route  Sig  Dispense  Refill  . acetaminophen (TYLENOL) 500 MG tablet   Oral   Take 500 mg by mouth every 6 (six) hours as needed.         Marland Kitchen amoxicillin (AMOXIL) 500 MG tablet   Oral   Take 2  tablets (1,000 mg total) by mouth 2 (two) times daily.   28 tablet   0   . neomycin-polymyxin-hydrocortisone (CORTISPORIN) 3.5-10000-1 otic suspension   Left Ear   Place 4 drops into the left ear 4 (four) times daily. X 7 days   10 mL   0    BP 113/77  Pulse 74  Temp(Src) 97.7 F (36.5 C) (Oral)  Resp 16  SpO2 99%  LMP 04/28/2013  Breastfeeding? No Physical Exam  Nursing note and vitals reviewed. Constitutional: She is oriented to person, place, and time. She appears well-developed and well-nourished. No distress.  HENT:  Head: Normocephalic and atraumatic.  Right Ear: Tympanic membrane normal. No drainage. No mastoid tenderness. Tympanic membrane is not erythematous and not retracted. No middle ear effusion.  Left Ear: No drainage. There is mastoid tenderness. Tympanic membrane is erythematous and retracted.  No middle ear effusion.  Nose: Right sinus exhibits frontal sinus tenderness. Right sinus exhibits no maxillary sinus tenderness. Left sinus exhibits frontal sinus tenderness. Left sinus exhibits no maxillary sinus tenderness.  Mouth/Throat: Uvula is midline, oropharynx is clear and moist and mucous membranes are normal. No trismus in the jaw. No oropharyngeal exudate, posterior oropharyngeal edema, posterior oropharyngeal erythema or tonsillar abscesses.  LEFT ear tender with manipulation of external ear. LEFT mastoid process tender and slightly enlarged when compared  to Right.   Bilateral ear canals erythematous and mildly inflamed. No exudates noted. (patient admits to Qtip use).    Eyes: Conjunctivae and EOM are normal.  Neck: Normal range of motion. Neck supple.  Cardiovascular: Normal rate and regular rhythm.  Exam reveals no gallop and no friction rub.   No murmur heard. Pulmonary/Chest: Effort normal and breath sounds normal. No respiratory distress. She has no wheezes. She has no rales.  Musculoskeletal: Normal range of motion. She exhibits no edema.   Neurological: She is alert and oriented to person, place, and time.  Skin: Skin is warm and dry. She is not diaphoretic.  Psychiatric: She has a normal mood and affect. Her behavior is normal.    ED Course  Procedures (including critical care time) Labs Review Labs Reviewed  POCT I-STAT, CHEM 8 - Abnormal; Notable for the following:    Calcium, Ion 1.31 (*)    All other components within normal limits  POCT PREGNANCY, URINE   Imaging Review Ct Maxillofacial W/cm  05/13/2013   CLINICAL DATA:  Left ear and jaw pain.  EXAM: CT MAXILLOFACIAL WITH CONTRAST  TECHNIQUE: Multidetector CT imaging of the maxillofacial structures was performed with intravenous contrast. Multiplanar CT image reconstructions were also generated. A small metallic BB was placed on the right temple in order to reliably differentiate right from left.  CONTRAST:  129mL OMNIPAQUE IOHEXOL 300 MG/ML  SOLN  COMPARISON:  CT of the head July 04, 2010  FINDINGS: Status post left wall up mastoidectomy for placement of cochlear implant, the surgical bed appears well aerated. No destructive bony lesions are fluid collections.  Normal appearance of major salivary glands and visualized thyroid gland. No free fluid or fluid collections within the pacer upper neck. The visualized aerodigestive tract is unremarkable. Preservation parapharyngeal fat tissue planes. Mandible is intact in the condyles are located. No periapical abscess.  Normal appearance included cervical vessels. Trace right maxillary mucosal thickening with bilateral sphenoid sinus small air-fluid levels. The ocular globes and orbital contents are unremarkable.  IMPRESSION: Status post left cochlear implant without superimposed findings of acute process about the left ear or face/neck.  Mild paranasal sinusitis.   Electronically Signed   By: Elon Alas   On: 05/13/2013 02:15    EKG Interpretation   None       MDM   1. Sinusitis   2. Ear pain, left   3. Otitis  media   4. Otitis externa    Vital signs WNL  CT shows evidence of sinusitis. Otherwise no acute abnormalities.  Exam consistent with Otitis Media and Otitis externa of LEFT ear.   Discussed findings with patient. Translator present to insure patient understanding. Patient understanding confirmed. Patient agrees with plan for outpatient therapy with oral and otic antibiotics with return precautions should symptoms worsen.   Meds given in ED:  Medications  iohexol (OMNIPAQUE) 300 MG/ML solution 100 mL (100 mLs Intravenous Contrast Given 05/13/13 0147)    Discharge Medication List as of 05/13/2013  2:42 AM    START taking these medications   Details  amoxicillin (AMOXIL) 500 MG tablet Take 2 tablets (1,000 mg total) by mouth 2 (two) times daily., Starting 05/13/2013, Until Discontinued, Print    neomycin-polymyxin-hydrocortisone (CORTISPORIN) 3.5-10000-1 otic suspension Place 4 drops into the left ear 4 (four) times daily. X 7 days, Starting 05/13/2013, Until Discontinued, Print           Sherrie George, [place] 05/14/13 1249

## 2013-05-13 NOTE — ED Notes (Signed)
Pt is here w/ left ear pain similar to when she has had ear infections in the past however this time is more painful.  Pt is deaf and we are communicating through writing.  Pt rates pain a 6/10 in left ear, jaw and head.  Denies nausea, fever or other sx of infection.

## 2013-05-14 NOTE — ED Provider Notes (Signed)
Medical screening examination/treatment/procedure(s) were performed by non-physician practitioner and as supervising physician I was immediately available for consultation/collaboration.   Teressa Lower, MD 05/14/13 [phone]

## 2013-05-23 ENCOUNTER — Emergency Department (HOSPITAL_COMMUNITY)
Admission: EM | Admit: 2013-05-23 | Discharge: 2013-05-23 | Disposition: A | Discharge status: Home / Self Care | Payer: Medicaid Other | Attending: Emergency Medicine | Admitting: Emergency Medicine

## 2013-05-23 ENCOUNTER — Emergency Department (HOSPITAL_COMMUNITY): Payer: Medicaid Other

## 2013-05-23 ENCOUNTER — Encounter (HOSPITAL_COMMUNITY): Payer: Self-pay | Admitting: Emergency Medicine

## 2013-05-23 DIAGNOSIS — J029 Acute pharyngitis, unspecified: Secondary | ICD-10-CM | POA: Insufficient documentation

## 2013-05-23 DIAGNOSIS — IMO0002 Reserved for concepts with insufficient information to code with codable children: Secondary | ICD-10-CM | POA: Insufficient documentation

## 2013-05-23 DIAGNOSIS — Z792 Long term (current) use of antibiotics: Secondary | ICD-10-CM | POA: Insufficient documentation

## 2013-05-23 DIAGNOSIS — Z3202 Encounter for pregnancy test, result negative: Secondary | ICD-10-CM | POA: Insufficient documentation

## 2013-05-23 DIAGNOSIS — J069 Acute upper respiratory infection, unspecified: Secondary | ICD-10-CM

## 2013-05-23 DIAGNOSIS — R110 Nausea: Secondary | ICD-10-CM | POA: Insufficient documentation

## 2013-05-23 DIAGNOSIS — H9190 Unspecified hearing loss, unspecified ear: Secondary | ICD-10-CM | POA: Insufficient documentation

## 2013-05-23 LAB — COMPREHENSIVE METABOLIC PANEL
ALT: 10 U/L (ref 0–35)
AST: 14 U/L (ref 0–37)
Albumin: 4 g/dL (ref 3.5–5.2)
Alkaline Phosphatase: 74 U/L (ref 39–117)
BUN: 9 mg/dL (ref 6–23)
CALCIUM: 8.8 mg/dL (ref 8.4–10.5)
CO2: 26 meq/L (ref 19–32)
CREATININE: 0.65 mg/dL (ref 0.50–1.10)
Chloride: 101 mEq/L (ref 96–112)
GLUCOSE: 89 mg/dL (ref 70–99)
Potassium: 4.1 mEq/L (ref 3.7–5.3)
SODIUM: 137 meq/L (ref 137–147)
Total Bilirubin: 0.2 mg/dL — ABNORMAL LOW (ref 0.3–1.2)
Total Protein: 7.7 g/dL (ref 6.0–8.3)

## 2013-05-23 LAB — CBC WITH DIFFERENTIAL/PLATELET
BASOS ABS: 0 10*3/uL (ref 0.0–0.1)
BASOS PCT: 0 % (ref 0–1)
Eosinophils Absolute: 0.1 10*3/uL (ref 0.0–0.7)
Eosinophils Relative: 1 % (ref 0–5)
HCT: 39.4 % (ref 36.0–46.0)
HEMOGLOBIN: 13.2 g/dL (ref 12.0–15.0)
LYMPHS PCT: 17 % (ref 12–46)
Lymphs Abs: 1.7 10*3/uL (ref 0.7–4.0)
MCH: 29.6 pg (ref 26.0–34.0)
MCHC: 33.5 g/dL (ref 30.0–36.0)
MCV: 88.3 fL (ref 78.0–100.0)
MONOS PCT: 11 % (ref 3–12)
Monocytes Absolute: 1 10*3/uL (ref 0.1–1.0)
NEUTROS ABS: 7 10*3/uL (ref 1.7–7.7)
NEUTROS PCT: 71 % (ref 43–77)
Platelets: 273 10*3/uL (ref 150–400)
RBC: 4.46 MIL/uL (ref 3.87–5.11)
RDW: 12.8 % (ref 11.5–15.5)
WBC: 9.8 10*3/uL (ref 4.0–10.5)

## 2013-05-23 LAB — URINALYSIS, ROUTINE W REFLEX MICROSCOPIC
Bilirubin Urine: NEGATIVE
Glucose, UA: NEGATIVE mg/dL
Hgb urine dipstick: NEGATIVE
Ketones, ur: NEGATIVE mg/dL
Leukocytes, UA: NEGATIVE
NITRITE: NEGATIVE
Protein, ur: NEGATIVE mg/dL
SPECIFIC GRAVITY, URINE: 1.022 (ref 1.005–1.030)
UROBILINOGEN UA: 1 mg/dL (ref 0.0–1.0)
pH: 7.5 (ref 5.0–8.0)

## 2013-05-23 LAB — PREGNANCY, URINE: PREG TEST UR: NEGATIVE

## 2013-05-23 LAB — RAPID STREP SCREEN (MED CTR MEBANE ONLY): Streptococcus, Group A Screen (Direct): NEGATIVE

## 2013-05-23 MED ORDER — GUAIFENESIN-DM 100-10 MG/5ML PO SYRP: 5.0000 mL | ORAL_SOLUTION | ORAL | Status: DC | PRN

## 2013-05-23 MED ORDER — ACETAMINOPHEN 325 MG PO TABS: 650.0000 mg | ORAL_TABLET | Freq: Four times a day (QID) | ORAL | Status: DC | PRN

## 2013-05-23 MED ORDER — ACETAMINOPHEN 325 MG PO TABS
650.0000 mg | ORAL_TABLET | Freq: Once | ORAL | Status: AC
  Administered 2013-05-23: 650 mg via ORAL
  Filled 2013-05-23: qty 2

## 2013-05-23 NOTE — ED Notes (Signed)
Attempted rapid strep, pt pulling swab out of mouth, states she can't

## 2013-05-23 NOTE — ED Notes (Addendum)
Since Sunday, c/o sore throat, went to doctor last week with ear ache and got antibiotic, that is resolved, had coughing on Sunday with sore throat and body aches, was on amoxicillin she thinks

## 2013-05-23 NOTE — Discharge Instructions (Signed)
Continue to alternate Tylenol and Ibuprofen (dose below) Tylenol 650 mg every 4-6 hours  Ibuprofen 600-800 mg every 6-8 hours  Drink plenty of fluids and rest  Return to the emergency department if you develop any changing/worsening condition, difficulty swallowing/breathing, chest pain, coughing up blood, repeated vomiting, fever, or any other concerns (please read additional information regarding your condition below)    Sore Throat A sore throat is pain, burning, irritation, or scratchiness of the throat. There is often pain or tenderness when swallowing or talking. A sore throat may be accompanied by other symptoms, such as coughing, sneezing, fever, and swollen neck glands. A sore throat is often the first sign of another sickness, such as a cold, flu, strep throat, or mononucleosis (commonly known as mono). Most sore throats go away without medical treatment. CAUSES  The most common causes of a sore throat include:  A viral infection, such as a cold, flu, or mono.  A bacterial infection, such as strep throat, tonsillitis, or whooping cough.  Seasonal allergies.  Dryness in the air.  Irritants, such as smoke or pollution.  Gastroesophageal reflux disease (GERD). HOME CARE INSTRUCTIONS   Only take over-the-counter medicines as directed by your caregiver.  Drink enough fluids to keep your urine clear or pale yellow.  Rest as needed.  Try using throat sprays, lozenges, or sucking on hard candy to ease any pain (if older than 4 years or as directed).  Sip warm liquids, such as broth, herbal tea, or warm water with honey to relieve pain temporarily. You may also eat or drink cold or frozen liquids such as frozen ice pops.  Gargle with salt water (mix 1 tsp salt with 8 oz of water).  Do not smoke and avoid secondhand smoke.  Put a cool-mist humidifier in your bedroom at night to moisten the air. You can also turn on a hot shower and sit in the bathroom with the door closed for 5  10 minutes. SEEK IMMEDIATE MEDICAL CARE IF:  You have difficulty breathing.  You are unable to swallow fluids, soft foods, or your saliva.  You have increased swelling in the throat.  Your sore throat does not get better in 7 days.  You have nausea and vomiting.  You have a fever or persistent symptoms for more than 2 3 days.  You have a fever and your symptoms suddenly get worse. MAKE SURE YOU:   Understand these instructions.  Will watch your condition.  Will get help right away if you are not doing well or get worse. Document Released: 04/29/2004 Document Revised: 03/08/2012 Document Reviewed: 11/28/2011 [facility] Patient Information 2014 [place], [place].  Upper Respiratory Infection, Adult An upper respiratory infection (URI) is also known as the common cold. It is often caused by a type of germ (virus). Colds are easily spread (contagious). You can pass it to others by kissing, coughing, sneezing, or drinking out of the same glass. Usually, you get better in 1 or 2 weeks.  HOME CARE   Only take medicine as told by your doctor.  Use a warm mist humidifier or breathe in steam from a hot shower.  Drink enough water and fluids to keep your pee (urine) clear or pale yellow.  Get plenty of rest.  Return to work when your temperature is back to normal or as told by your doctor. You may use a face mask and wash your hands to stop your cold from spreading. GET HELP RIGHT AWAY IF:   After the first  few days, you feel you are getting worse.  You have questions about your medicine.  You have chills, shortness of breath, or brown or red spit (mucus).  You have yellow or brown snot (nasal discharge) or pain in the face, especially when you bend forward.  You have a fever, puffy (swollen) neck, pain when you swallow, or white spots in the back of your throat.  You have a bad headache, ear pain, sinus pain, or chest pain.  You have a high-pitched whistling sound when you  breathe in and out (wheezing).  You have a lasting cough or cough up blood.  You have sore muscles or a stiff neck. MAKE SURE YOU:   Understand these instructions.  Will watch your condition.  Will get help right away if you are not doing well or get worse. Document Released: 09/08/2007 Document Revised: 06/14/2011 Document Reviewed: 07/27/2010 [facility] Patient Information 2014 [place], [place].   Emergency Department Resource Guide 1) Find a Doctor and Pay Out of Pocket Although you won't have to find out who is covered by your insurance plan, it is a good idea to ask around and get recommendations. You will then need to call the office and see if the doctor you have chosen will accept you as a new patient and what types of options they offer for patients who are self-pay. Some doctors offer discounts or will set up payment plans for their patients who do not have insurance, but you will need to ask so you aren't surprised when you get to your appointment.  2) Contact Your Local Health Department Not all health departments have doctors that can see patients for sick visits, but many do, so it is worth a call to see if yours does. If you don't know where your local health department is, you can check in your phone book. The CDC also has a tool to help you locate your state's health department, and many state websites also have listings of all of their local health departments.  3) Find a [facility] If your illness is not likely to be very severe or complicated, you may want to try a walk in clinic. These are popping up all over the country in pharmacies, drugstores, and shopping centers. They're usually staffed by nurse practitioners or physician assistants that have been trained to treat common illnesses and complaints. They're usually fairly quick and inexpensive. However, if you have serious medical issues or chronic medical problems, these are probably not your best option.  No  Primary Care Doctor: - Call Health Connect at  [phone] - they can help you locate a primary care doctor that  accepts your insurance, provides certain services, etc. - Physician Referral Service- [phone]  Chronic Pain Problems: Organization         Address  Phone   Notes  [facility]  [phone] Patients need to be referred by their primary care doctor.   Medication Assistance: Organization         Address  Phone   Notes  [facility] Medication [facility] [address] [place], [place] [address] [phone] --Must be a resident of [facility] -- Must have NO insurance coverage whatsoever (no Medicaid/ Medicare, etc.) -- The pt. MUST have a primary care doctor that directs their care regularly and follows them in the community   MedAssist  [phone]   [employer]  [phone]    Agencies that provide inexpensive medical  care: Organization         Address  Phone   Notes  [facility]  [phone]   [facility]    [phone]   [facility] [address] [place], [place] [address] [phone]   [facility] [address] [place] [address], [place] [phone]   Planned Parenthood    [phone]   [facility]    [phone]   [facility] and [facility]  [address], [place] Phone:  [phone], Fax:  [phone] Hours of Operation:  9 am - 6 pm, M-F.  Also accepts Medicaid/Medicare and self-pay.  [facility] for [facility]  [address], [place] Phone: [phone], Fax: [phone]. Hours of Operation:  8:30 am - 5:30 pm, M-F.  Also accepts Medicaid and self-pay.  [facility] [place] [address] [place] Phone: [phone]   [facility] [address] Natasha Bence [place], [place] [phone], Ext. [phone] Mondays & Thursdays: 7-9 AM.  First 15 patients are seen on  a first come, first serve basis.    Medicaid-accepting [facility] Providers:  Organization         Address  Phone   Notes  [facility] [address], [place] [phone] Also accepts self-pay patients.  [facility] [address] Laurell Josephs [place], [place]  [phone]   [facility] [address], [place] [phone]   [facility] [address] [place] [phone]   Renaye Rakers [address], [place]   [phone] Only accepts [place] Access [place] patients after they have their name applied to their card.   Self-Pay (no insurance) in [facility]:  Organization         Address  Phone   Notes  Sickle Cell Patients, [facility] [address] [place], [place] [phone]   [facility] [address] [place], [place] [phone]   [facility] [place]  [address], [place] [phone]   [facility]/Dr. Osei-Bonsu  [address] [place] or [address], [place] [phone] Phone number for both [place] and [place] locations is the same.  [facility]gent [facility] and [facility] [address] [place] [phone]   [facility] [address] [place] or [address] [phone]   [facility] [address], [place] [phone], phone; [phone], fax Sees patients 1st and 3rd Saturday of every month.  Must not qualify for public or private insurance (i.e. Medicaid, Medicare, [place] Health Choice, Veterans' Benefits)  Household income should be no more than 200% of the poverty level The clinic cannot treat you if you are pregnant or think you are pregnant  Sexually transmitted diseases are not treated at the clinic.    Dental Care: Organization          Address  Phone  Notes  [facility] of [facility] [address] [place], [place] [phone] Accepts children up to age 21 who are enrolled in [place] or [place] Health Choice; pregnant women with a Medicaid card; and children who have applied for Medicaid or [place] Health Choice, but were  declined, whose parents can pay a reduced fee at time of service.  [facility] of [facility]  [address], [place] [phone] Accepts children up to age 19 who are enrolled in [place] or [place]; pregnant women with a Medicaid card; and children who have applied for Medicaid or  Health Choice, but were declined, whose parents can pay a reduced fee at time of service.  [facility] Adult [facility] Access PRO[facility]  [place] [phone] Patients are seen by appointment only. Walk-ins are not accepted. [place] will see patients 50 years of age and older. Monday - Tuesday (8am-5pm) Most Wednesdays (8:30-5pm) $30 per visit, cash only  [facility] Adult Dental Access PROGRAM  [address], [facility] [phone] Patients are seen by appointment only. Walk-ins are not accepted. [place] will see patients 51 years of age and older. One Wednesday Evening (Monthly: Volunteer Based).  $30 per visit, cash only  [place]  [phone] for adults; Children under age 50, call Graduate Pediatric Dentistry at [phone]. Children aged 90-14, please call [phone] to request a pediatric application.  Dental services are provided in all areas of dental care including fillings, crowns and bridges, complete and partial dentures, implants, gum treatment, root canals, and extractions. Preventive care is also provided. Treatment is provided to both adults and children. Patients are selected via a lottery and there is often a waiting list.   [facility] [address], [place]  [phone] [email or web address]   [facility] [address] [place], [place] [phone], Ext. [phone] Second and Fourth Thursday of each month, opens at 6:30 AM; Clinic ends at 9 AM.  Patients are seen on a first-come first-served basis, and a limited number are seen during each clinic.   [facility]  [address] Hillard Danker [place], [place] [phone]   Eligibility Requirements You must have lived in [place], [place], or [place] counties for at least the last three months.   You cannot be eligible for state or federal sponsored [employer], including [employer], [place], or [employer].   You generally cannot be eligible for healthcare insurance through your employer.    How to apply: Eligibility screenings are held every Tuesday and Wednesday afternoon from 1:00 pm until 4:00 pm. You do not need an appointment for the interview!  [facility] [address] [place],    [place]  [place] Department  [place]  [phone]    Behavioral Health Resources in the Community: Intensive Outpatient Programs Organization         Address  Phone  Notes  [place]oyle Cedar. [address], [place], [place] [phone]   [facility] Outpatient [address] [place], [place]   ADS: [facility] [address], [place], [place]   [place] [address]  [place], [name] or [phone]   Substance Abuse Resources Organization         Address  Phone  Notes  Alcohol [facility] Drug Services  [phone]   [place]  [phone]   [facility] [place]   Chinita Pester  [phone]   Residential & Outpatient Substance Abuse Program  [phone]   Psychological  Services Organization  Address  Phone  Notes  [place]  [place]  [phone]   [place] [address], [place] or [phone]    Mobile Crisis Teams Organization         Address  Phone  Notes  [facility], Mobile Crisis Care Unit  [phone]   [facility]  [address] [place], [place]   [place] [address] [place] [phone]    Self-Help/Support Groups Organization         Address  Phone             Notes  [place] of [place] - variety of support groups  [place] Call for more information  Narcotics Anonymous (NA), [facility] [address], [employer] Regino Ramirez  2 meetings at this location   Special educational needs teacher         Address  Phone  Notes  [facility] Residential Treatment [place],    [place]  [phone]   [facility]  [address] [place] [id number], [place], [place]   [place], [place] [phone] Admissions: 8am-3pm M-F  Incentives Substance [place] [address]    [place], [place] [phone]   [facility] [address] [name], [place], [place]   [facility] [address],  [place], [place]   [facility] - Intensive Outpatient [address] Kristeen Mans [address], [place], [place]   [facility] ([place] [address].,  [place], [place] [phone] or [phone]   [facility] ([facility]) [address], [place], [place] Accepts Medicaid  [facility] [address],  [place] [phone] Substance Abuse/Addiction Treatment   [facility] Organization         Address  Phone  Notes  [facility]  [phone]   Domenic Schwab, PhD [address] Arlis Porta [place], [place]   [phone] or [phone]   [facility] [name], [place] [phone]   [facility] [address] [place], [place] [phone] Insurance/Medicaid/sponsorship through [facility] and Families [address]                                    [place], [place] [phone] [place] [address], [place] [phone]    Dr. Adele Schilder  [phone]   [facility] Dept. 1) [address], [place] 2) [place] 3)  [place] [address], [place] [phone]  [phone]   [place] [phone] or [phone] (After Hours)

## 2013-05-23 NOTE — ED Provider Notes (Signed)
CSN: [id number]     Arrival date & time 05/23/13  0901 History   First MD Initiated Contact with Patient 05/23/13 [phone]     Chief Complaint  Patient presents with  . Sore Throat    HPI  Diana Torres is a 23 y.o. female with a PMH of hearing loss who presents to the ED for evaluation of sore throat. History was provided by the patient using sign language interpreter. Patient was seen in the ED on 05/13/13 for ear pain. She was prescribed amoxicillin and her ear pain improved. She finished amoxicillin today. She states that 4 days ago she developed a sore throat, rhinorrhea, nasal congestion, sinus pressure, productive cough, nausea, generalized headache, decreased appetite, and myalgias. Cough is productive with yellow sputum and no hemoptysis. No chest pain, wheezing, SOB, or dyspnea. No abdominal pain, emesis, diarrhea, dysuria, or constipation. No known sick contacts. Patient states she has been taking Tylenol and Ibuprofen with no relief in her symptoms.    Past Medical History  Diagnosis Date  . Deaf   . Medical history non-contributory    Past Surgical History  Procedure Laterality Date  . Tonsillectomy    . Cochlear implant    . Rhinoplasty     History reviewed. No pertinent family history. History  Substance Use Topics  . Smoking status: Never Smoker   . Smokeless tobacco: Never Used  . Alcohol Use: No   OB History   Grav Para Term Preterm Abortions TAB SAB Ect Mult Living   1 1 1       1      Review of Systems  Constitutional: Positive for appetite change and fatigue. Negative for fever, chills, diaphoresis and activity change.  HENT: Positive for congestion, hearing loss (at baseline), rhinorrhea, sinus pressure and sore throat. Negative for ear discharge, ear pain, mouth sores and trouble swallowing.   Respiratory: Positive for cough. Negative for choking, chest tightness, shortness of breath and wheezing.   Cardiovascular: Negative for chest pain and leg swelling.   Gastrointestinal: Positive for nausea. Negative for vomiting, abdominal pain, diarrhea and constipation.  Genitourinary: Negative for dysuria and difficulty urinating.  Musculoskeletal: Positive for myalgias. Negative for back pain.  Skin: Negative for rash.  Neurological: Positive for headaches. Negative for dizziness, weakness and light-headedness.    Allergies  Codeine  Home Medications   Current Outpatient Rx  Name  Route  Sig  Dispense  Refill  . acetaminophen (TYLENOL) 500 MG tablet   Oral   Take 500 mg by mouth every 6 (six) hours as needed.         Marland Kitchen amoxicillin (AMOXIL) 500 MG tablet   Oral   Take 2 tablets (1,000 mg total) by mouth 2 (two) times daily.   28 tablet   0   . neomycin-polymyxin-hydrocortisone (CORTISPORIN) 3.5-10000-1 otic suspension   Left Ear   Place 4 drops into the left ear 4 (four) times daily. X 7 days   10 mL   0    BP 121/71  Pulse 118  Temp(Src) 99 F (37.2 C) (Oral)  Resp 18  SpO2 97%  LMP 04/28/2013  Filed Vitals:   05/23/13 0912 05/23/13 1012  BP: 121/71 116/76  Pulse: 118 103  Temp: 99 F (37.2 C) 99.3 F (37.4 C)  TempSrc: Oral Oral  Resp: 18 18  SpO2: 97% 96%    Physical Exam  Nursing note and vitals reviewed. Constitutional: She is oriented to person, place, and time. She appears well-developed and  well-nourished. No distress.  HENT:  Head: Normocephalic and atraumatic.  Right Ear: External ear normal.  Left Ear: External ear normal.  Nose: Nose normal.  Mouth/Throat: Oropharynx is clear and moist. No oropharyngeal exudate.  Limited exam to posterior pharynx due to patient compliance. Uvula midline. Erythema to the posterior pharynx. Tympanic membranes gray and translucent bilaterally with no erythema, edema, or hemotympanum. Nasal congestion. No sinus tenderness throughout.   Eyes: Conjunctivae are normal. Pupils are equal, round, and reactive to light. Right eye exhibits no discharge. Left eye exhibits no  discharge.  Neck: Normal range of motion. Neck supple.  Bilateral submandibular LAD. Firm, <1 cm, mobile, and non-tender equal bilaterally.   Cardiovascular: Normal rate, regular rhythm and normal heart sounds.  Exam reveals no gallop and no friction rub.   No murmur heard. Pulmonary/Chest: Effort normal and breath sounds normal. No respiratory distress. She has no wheezes. She has no rales. She exhibits no tenderness.  Intermittent coughing throughout exam  Abdominal: Soft. Bowel sounds are normal. She exhibits no distension and no mass. There is no tenderness. There is no rebound and no guarding.  Musculoskeletal: Normal range of motion. She exhibits no edema and no tenderness.  No LE edema or calf tenderness bilaterally  Neurological: She is alert and oriented to person, place, and time.  Skin: Skin is warm and dry. No rash noted. She is not diaphoretic.    ED Course  Procedures (including critical care time) Labs Review Labs Reviewed  RAPID STREP SCREEN   Imaging Review No results found.  EKG Interpretation   None      Results for orders placed during the hospital encounter of 05/23/13  RAPID STREP SCREEN      Result Value Ref Range   Streptococcus, Group A Screen (Direct) NEGATIVE  NEGATIVE  COMPREHENSIVE METABOLIC PANEL      Result Value Ref Range   Sodium 137  137 - 147 mEq/L   Potassium 4.1  3.7 - 5.3 mEq/L   Chloride 101  96 - 112 mEq/L   CO2 26  19 - 32 mEq/L   Glucose, Bld 89  70 - 99 mg/dL   BUN 9  6 - 23 mg/dL   Creatinine, Ser 0.65  0.50 - 1.10 mg/dL   Calcium 8.8  8.4 - 10.5 mg/dL   Total Protein 7.7  6.0 - 8.3 g/dL   Albumin 4.0  3.5 - 5.2 g/dL   AST 14  0 - 37 U/L   ALT 10  0 - 35 U/L   Alkaline Phosphatase 74  39 - 117 U/L   Total Bilirubin 0.2 (*) 0.3 - 1.2 mg/dL   GFR calc non Af Amer >90  >90 mL/min   GFR calc Af Amer >90  >90 mL/min  CBC WITH DIFFERENTIAL      Result Value Ref Range   WBC 9.8  4.0 - 10.5 K/uL   RBC 4.46  3.87 - 5.11 MIL/uL    Hemoglobin 13.2  12.0 - 15.0 g/dL   HCT 39.4  36.0 - 46.0 %   MCV 88.3  78.0 - 100.0 fL   MCH 29.6  26.0 - 34.0 pg   MCHC 33.5  30.0 - 36.0 g/dL   RDW 12.8  11.5 - 15.5 %   Platelets 273  150 - 400 K/uL   Neutrophils Relative % 71  43 - 77 %   Neutro Abs 7.0  1.7 - 7.7 K/uL   Lymphocytes Relative 17  12 - 46 %  Lymphs Abs 1.7  0.7 - 4.0 K/uL   Monocytes Relative 11  3 - 12 %   Monocytes Absolute 1.0  0.1 - 1.0 K/uL   Eosinophils Relative 1  0 - 5 %   Eosinophils Absolute 0.1  0.0 - 0.7 K/uL   Basophils Relative 0  0 - 1 %   Basophils Absolute 0.0  0.0 - 0.1 K/uL  URINALYSIS, ROUTINE W REFLEX MICROSCOPIC      Result Value Ref Range   Color, Urine YELLOW  YELLOW   APPearance CLEAR  CLEAR   Specific Gravity, Urine 1.022  1.005 - 1.030   pH 7.5  5.0 - 8.0   Glucose, UA NEGATIVE  NEGATIVE mg/dL   Hgb urine dipstick NEGATIVE  NEGATIVE   Bilirubin Urine NEGATIVE  NEGATIVE   Ketones, ur NEGATIVE  NEGATIVE mg/dL   Protein, ur NEGATIVE  NEGATIVE mg/dL   Urobilinogen, UA 1.0  0.0 - 1.0 mg/dL   Nitrite NEGATIVE  NEGATIVE   Leukocytes, UA NEGATIVE  NEGATIVE  PREGNANCY, URINE      Result Value Ref Range   Preg Test, Ur NEGATIVE  NEGATIVE     DG Chest 2 View (Final result)  Result time: 05/23/13 10:44:21    Final result by Rad Results In Interface (05/23/13 10:44:21)    Narrative:   CLINICAL DATA: Chest pain. With cough and sore throat  EXAM: CHEST 2 VIEW  COMPARISON: None.  FINDINGS: The lungs are adequately inflated. There is no focal infiltrate. There is no pleural effusion or pneumothorax or pneumomediastinum. The cardiopericardial silhouette is normal in size and contour. The pulmonary vascularity is not engorged. The mediastinum is normal in width. The observed portions of the bony thorax are normal.  IMPRESSION: There is no evidence of active cardiopulmonary disease.   Electronically Signed By: David Martinique On: 05/23/2013 10:44         MDM   Diana Torres is a 23 y.o. female with a PMH of hearing loss who presents to the ED for evaluation of multiple complaints.   Rechecks  11:30 AM = Patient resting comfortably. Just received Tylenol. Pulse ox 97% on room air. Pulse 103. Patient drinking fluids. Ready for discharge home.  11:35 AM = Patient requesting prescription for Tylenol which she states made her feel much better.    Multiple complaints possibly due to URI vs viral syndrome. Patient afebrile and non-toxic. Labs unremarkable. Rapid strep negative. No retropharyngeal or peritonsillar abscess. Chest x-ray negative for an acute cardiopulmonary process. Patient had improvements in her symptoms with Tylenol. Instructed to drink plenty of fluids and follow-up with PCP. Return precautions, discharge instructions, and follow-up was discussed with the patient before discharge.     Discharge Medication List as of 05/23/2013 11:32 AM    START taking these medications   Details  guaiFENesin-dextromethorphan (ROBITUSSIN DM) 100-10 MG/5ML syrup Take 5 mLs by mouth every 4 (four) hours as needed for cough., Starting 05/23/2013, Until Discontinued, Print         Final impressions: 1. Sore throat   2. URI (upper respiratory infection)      Harold Hedge Arvie Bartholomew PA-C            Lucila Maine, PA-C 05/23/13 2119

## 2013-05-24 NOTE — ED Provider Notes (Signed)
  Medical screening examination/treatment/procedure(s) were performed by non-physician practitioner and as supervising physician I was immediately available for consultation/collaboration.      Carmin Muskrat, MD 05/24/13 1213

## 2013-05-25 LAB — CULTURE, GROUP A STREP

## 2013-06-25 ENCOUNTER — Telehealth: Payer: Self-pay | Admitting: *Deleted

## 2013-06-25 NOTE — Telephone Encounter (Signed)
Patient called asking to discuss birth control options. She wasn't sure how she felt about the IUD. I asked her what her concerns are and she stated that she googled the mirena and saw lots of negative reviews. Her specific concerns were that it was going to hurt and that she would have excessive weight gain. I told her that google is not a good source of medical information. I told her that it is not comfortable having the iud inserted, however it is a relatively quick process. I recommended that she use the mirena website as a resource and to bring any additional questions with her to her appt. Patient agrees.

## 2013-07-26 ENCOUNTER — Ambulatory Visit (INDEPENDENT_AMBULATORY_CARE_PROVIDER_SITE_OTHER): Payer: Medicaid Other | Admitting: Family Medicine

## 2013-07-26 ENCOUNTER — Encounter: Payer: Self-pay | Admitting: Family Medicine

## 2013-07-26 ENCOUNTER — Ambulatory Visit: Payer: Medicaid Other | Admitting: Family Medicine

## 2013-07-26 VITALS — BP 117/76 | HR 78 | Temp 98.6°F | Ht 67.0 in

## 2013-07-26 DIAGNOSIS — Z30430 Encounter for insertion of intrauterine contraceptive device: Secondary | ICD-10-CM

## 2013-07-26 DIAGNOSIS — Z309 Encounter for contraceptive management, unspecified: Secondary | ICD-10-CM

## 2013-07-26 DIAGNOSIS — Z3202 Encounter for pregnancy test, result negative: Secondary | ICD-10-CM

## 2013-07-26 LAB — POCT PREGNANCY, URINE: Preg Test, Ur: NEGATIVE

## 2013-07-26 MED ORDER — LEVONORGESTREL 20 MCG/24HR IU IUD
INTRAUTERINE_SYSTEM | Freq: Once | INTRAUTERINE | Status: AC
  Administered 2013-07-26: 17:00:00 via INTRAUTERINE

## 2013-07-26 NOTE — Patient Instructions (Signed)
Refrain from intercourse for next 3 days and use condoms for next week until 7 days after procedure.  Levonorgestrel intrauterine device (IUD) What is this medicine? LEVONORGESTREL IUD (LEE voe nor jes trel) is a contraceptive (birth control) device. The device is placed inside the uterus by a healthcare professional. It is used to prevent pregnancy and can also be used to treat heavy bleeding that occurs during your period. Depending on the device, it can be used for 3 to 5 years. This medicine may be used for other purposes; ask your health care provider or pharmacist if you have questions. COMMON BRAND NAME(S): Jerral Bonito What should I tell my health care provider before I take this medicine? They need to know if you have any of these conditions: -abnormal Pap smear -cancer of the breast, uterus, or cervix -diabetes -endometritis -genital or pelvic infection now or in the past -have more than one sexual partner or your partner has more than one partner -heart disease -history of an ectopic or tubal pregnancy -immune system problems -IUD in place -liver disease or tumor -problems with blood clots or take blood-thinners -use intravenous drugs -uterus of unusual shape -vaginal bleeding that has not been explained -an unusual or allergic reaction to levonorgestrel, other hormones, silicone, or polyethylene, medicines, foods, dyes, or preservatives -pregnant or trying to get pregnant -breast-feeding How should I use this medicine? This device is placed inside the uterus by a health care professional. Talk to your pediatrician regarding the use of this medicine in children. Special care may be needed. Overdosage: If you think you have taken too much of this medicine contact a poison control center or emergency room at once. NOTE: This medicine is only for you. Do not share this medicine with others. What if I miss a dose? This does not apply. What may interact with this  medicine? Do not take this medicine with any of the following medications: -amprenavir -bosentan -fosamprenavir This medicine may also interact with the following medications: -aprepitant -barbiturate medicines for inducing sleep or treating seizures -bexarotene -griseofulvin -medicines to treat seizures like carbamazepine, ethotoin, felbamate, oxcarbazepine, phenytoin, topiramate -modafinil -pioglitazone -rifabutin -rifampin -rifapentine -some medicines to treat HIV infection like atazanavir, indinavir, lopinavir, nelfinavir, tipranavir, ritonavir -St. John's wort -warfarin This list may not describe all possible interactions. Give your health care provider a list of all the medicines, herbs, non-prescription drugs, or dietary supplements you use. Also tell them if you smoke, drink alcohol, or use illegal drugs. Some items may interact with your medicine. What should I watch for while using this medicine? Visit your doctor or health care professional for regular check ups. See your doctor if you or your partner has sexual contact with others, becomes HIV positive, or gets a sexual transmitted disease. This product does not protect you against HIV infection (AIDS) or other sexually transmitted diseases. You can check the placement of the IUD yourself by reaching up to the top of your vagina with clean fingers to feel the threads. Do not pull on the threads. It is a good habit to check placement after each menstrual period. Call your doctor right away if you feel more of the IUD than just the threads or if you cannot feel the threads at all. The IUD may come out by itself. You may become pregnant if the device comes out. If you notice that the IUD has come out use a backup birth control method like condoms and call your health care provider. Using tampons will not  change the position of the IUD and are okay to use during your period. What side effects may I notice from receiving this  medicine? Side effects that you should report to your doctor or health care professional as soon as possible: -allergic reactions like skin rash, itching or hives, swelling of the face, lips, or tongue -fever, flu-like symptoms -genital sores -high blood pressure -no menstrual period for 6 weeks during use -pain, swelling, warmth in the leg -pelvic pain or tenderness -severe or sudden headache -signs of pregnancy -stomach cramping -sudden shortness of breath -trouble with balance, talking, or walking -unusual vaginal bleeding, discharge -yellowing of the eyes or skin Side effects that usually do not require medical attention (report to your doctor or health care professional if they continue or are bothersome): -acne -breast pain -change in sex drive or performance -changes in weight -cramping, dizziness, or faintness while the device is being inserted -headache -irregular menstrual bleeding within first 3 to 6 months of use -nausea This list may not describe all possible side effects. Call your doctor for medical advice about side effects. You may report side effects to FDA at 1-800-FDA-1088. Where should I keep my medicine? This does not apply. NOTE: This sheet is a summary. It may not cover all possible information. If you have questions about this medicine, talk to your doctor, pharmacist, or health care provider.  2014, Elsevier/Gold Standard. (2011-04-22 13:54:04)

## 2013-07-26 NOTE — Progress Notes (Signed)
Diana Torres is a 23 y.o. year old [id number] Caucasian female who presents for placement of a Mirena IUD.  Patient's last menstrual period was 06/30/2013. BP 117/76  Pulse 78  Temp(Src) 98.6 F (37 C) (Oral)  Ht 5\' 7"  (1.702 m)  LMP 06/30/2013  Breastfeeding? No Pregnancy test today was negative today  The risks and benefits of the method and placement have been thouroughly reviewed with the patient and all questions were answered.  Specifically the patient is aware of failure rate of 04/998, expulsion of the IUD and of possible perforation.  The patient is aware of irregular bleeding due to the method and understands the incidence of irregular bleeding diminishes with time.  Signed copy of informed consent in chart.   Time out was performed.  A graves speculum was placed in the vagina.  The cervix was visualized, prepped using Betadine. The uterus was found to be retroflexed and it sounded to 8 cm.  Mirena IUD placed per manufacturer's recommendations.   The strings were trimmed to 3 cm.  The patient was given post procedure instructions, including signs and symptoms of infection and to check for the strings after each menses or each month, and refraining from intercourse or anything in the vagina for 3 days.  She was given a Mirena care card with date Mirena placed, and date Mirena to be removed.  She is scheduled for a f/u appointment in 4 weeks.  Allen Norris CNM, [facility] 07/26/2013 4:28 PM

## 2013-07-30 ENCOUNTER — Encounter: Payer: Self-pay | Admitting: *Deleted

## 2013-08-24 ENCOUNTER — Ambulatory Visit: Payer: Medicaid Other | Admitting: Family Medicine

## 2013-09-21 ENCOUNTER — Encounter: Payer: Self-pay | Admitting: Family Medicine

## 2013-09-21 ENCOUNTER — Ambulatory Visit (INDEPENDENT_AMBULATORY_CARE_PROVIDER_SITE_OTHER): Payer: Medicaid Other | Admitting: Family Medicine

## 2013-09-21 VITALS — BP 116/83 | HR 77 | Temp 97.6°F | Wt 158.0 lb

## 2013-09-21 DIAGNOSIS — Z30431 Encounter for routine checking of intrauterine contraceptive device: Secondary | ICD-10-CM

## 2013-09-21 DIAGNOSIS — T8389XA Other specified complication of genitourinary prosthetic devices, implants and grafts, initial encounter: Secondary | ICD-10-CM

## 2013-09-21 NOTE — Patient Instructions (Signed)
Intrauterine Device Insertion, Care After Refer to this sheet in the next few weeks. These instructions provide you with information on caring for yourself after your procedure. Your health care Tatijana Bierly may also give you more specific instructions. Your treatment has been planned according to current medical practices, but problems sometimes occur. Call your health care Noni Stonesifer if you have any problems or questions after your procedure. WHAT TO EXPECT AFTER THE PROCEDURE Insertion of the IUD may cause some discomfort, such as cramping. The cramping should improve after the IUD is in place. You may have bleeding after the procedure. This is normal. It varies from light spotting for a few days to menstrual-like bleeding. When the IUD is in place, a string will extend past the cervix into the vagina for 1-2 inches. The strings should not bother you or your partner. If they do, talk to your health care Dashanna Kinnamon.  HOME CARE INSTRUCTIONS   Check your intrauterine device (IUD) to make sure it is in place before you resume sexual activity. You should be able to feel the strings. If you cannot feel the strings, something may be wrong. The IUD may have fallen out of the uterus, or the uterus may have been punctured (perforated) during placement. Also, if the strings are getting longer, it may mean that the IUD is being forced out of the uterus. You no longer have full protection from pregnancy if any of these problems occur.  You may resume sexual intercourse if you are not having problems with the IUD. The copper IUD is considered immediately effective, and the hormone IUD works right away if inserted within 7 days of your period starting. You will need to use a backup method of birth control for 7 days if the IUD in inserted at any other time in your cycle.  Continue to check that the IUD is still in place by feeling for the strings after every menstrual period.  You may need to take pain medicine such as  acetaminophen or ibuprofen. Only take medicines as directed by your health care Aedyn Kempfer. SEEK MEDICAL CARE IF:   You have bleeding that is heavier or lasts longer than a normal menstrual cycle.  You have a fever.  You have increasing cramps or abdominal pain not relieved with medicine.  You have abdominal pain that does not seem to be related to the same area of earlier cramping and pain.  You are lightheaded, unusually weak, or faint.  You have abnormal vaginal discharge or smells.  You have pain during sexual intercourse.  You cannot feel the IUD strings, or the IUD string has gotten longer.  You feel the IUD at the opening of the cervix in the vagina.  You think you are pregnant, or you miss your menstrual period.  The IUD string is hurting your sex partner. MAKE SURE YOU:  Understand these instructions.  Will watch your condition.  Will get help right away if you are not doing well or get worse. Document Released: 11/18/2010 Document Revised: 01/10/2013 Document Reviewed: 09/10/2012 Exit[employer] Patient Information 2015 [employer]. This information is not intended to replace advice given to you by your health care Kenroy Timberman. Make sure you discuss any questions you have with your health care Lavone Weisel.  

## 2013-09-21 NOTE — Progress Notes (Signed)
S: 23 yo here for IUD string check  - no complications  - had intercourse and no complications and husband didn't complain  O:  Filed Vitals:   09/21/13 1047  BP: 116/83  Pulse: 77  Temp: 97.6 F (36.4 C)  Weight: 158 lb (71.668 kg)   Gen: NAD GU: NEFG, cervical ectropion no strings visualized even with faux swab and manipulation of the cervix.   A/P IUD check up - Plan: US Transvaginal Non-OB  IUD complication, initial encounter - Plan: US Transvaginal Non-OB  - unable to [place] in clinic today as being used - will send for TV US - use second form of contraception until [place]   Dulcie Gammon L, MD

## 2013-09-24 ENCOUNTER — Other Ambulatory Visit: Payer: Self-pay | Admitting: Family Medicine

## 2013-09-24 DIAGNOSIS — Z30431 Encounter for routine checking of intrauterine contraceptive device: Secondary | ICD-10-CM

## 2013-09-24 DIAGNOSIS — T8389XA Other specified complication of genitourinary prosthetic devices, implants and grafts, initial encounter: Secondary | ICD-10-CM

## 2013-09-25 ENCOUNTER — Ambulatory Visit (HOSPITAL_COMMUNITY)
Admission: RE | Admit: 2013-09-25 | Discharge: 2013-09-25 | Disposition: A | Discharge status: Home / Self Care | Payer: Medicaid Other | Source: Ambulatory Visit | Attending: Family Medicine | Admitting: Family Medicine

## 2013-09-25 DIAGNOSIS — N83209 Unspecified ovarian cyst, unspecified side: Secondary | ICD-10-CM | POA: Insufficient documentation

## 2013-09-25 DIAGNOSIS — Z30431 Encounter for routine checking of intrauterine contraceptive device: Secondary | ICD-10-CM | POA: Insufficient documentation

## 2013-09-25 DIAGNOSIS — N854 Malposition of uterus: Secondary | ICD-10-CM | POA: Insufficient documentation

## 2013-09-25 DIAGNOSIS — T8389XA Other specified complication of genitourinary prosthetic devices, implants and grafts, initial encounter: Secondary | ICD-10-CM

## 2013-10-01 ENCOUNTER — Telehealth: Payer: Self-pay | Admitting: *Deleted

## 2013-10-01 NOTE — Telephone Encounter (Signed)
Called patient with assistance of interpreter for the deaf. Patient not available. Message left for her to call us back.

## 2013-10-01 NOTE — Telephone Encounter (Signed)
Message copied by Mitchell Heir on Mon Oct 01, 2013  8:22 AM ------      Message from: Kassie Mends      Created: Thu Sep 27, 2013  5:28 PM       Can you let her know that her IUD is in her uterus so all is good? ------

## 2013-10-03 ENCOUNTER — Encounter: Payer: Self-pay | Admitting: *Deleted

## 2013-10-03 NOTE — Telephone Encounter (Signed)
Letter sent to patient with results.

## 2013-10-09 NOTE — Telephone Encounter (Signed)
Patient returned call to OB/GYN office via [place] interpreter.  Notified her of below IUD placement.  Patient is still insistent of having IUD removed.  Transferred patient to [facility] appointment line to schedule IUD removal appointment.

## 2013-10-26 ENCOUNTER — Encounter: Payer: Self-pay | Admitting: General Practice

## 2013-11-19 ENCOUNTER — Encounter: Payer: Self-pay | Admitting: Obstetrics and Gynecology

## 2013-11-19 ENCOUNTER — Ambulatory Visit: Payer: Medicaid Other | Admitting: Obstetrics and Gynecology

## 2013-11-23 ENCOUNTER — Encounter: Payer: Self-pay | Admitting: General Practice

## 2014-02-04 ENCOUNTER — Encounter: Payer: Self-pay | Admitting: Family Medicine

## 2014-04-05 HISTORY — PX: OTHER SURGICAL HISTORY: SHX169

## 2014-05-08 ENCOUNTER — Encounter (HOSPITAL_COMMUNITY): Payer: Self-pay

## 2014-05-08 ENCOUNTER — Emergency Department (HOSPITAL_COMMUNITY)
Admission: EM | Admit: 2014-05-08 | Discharge: 2014-05-08 | Disposition: A | Discharge status: Home / Self Care | Payer: Medicaid Other | Attending: Emergency Medicine | Admitting: Emergency Medicine

## 2014-05-08 ENCOUNTER — Emergency Department (HOSPITAL_COMMUNITY): Payer: Medicaid Other

## 2014-05-08 DIAGNOSIS — H9190 Unspecified hearing loss, unspecified ear: Secondary | ICD-10-CM | POA: Insufficient documentation

## 2014-05-08 DIAGNOSIS — S29001A Unspecified injury of muscle and tendon of front wall of thorax, initial encounter: Secondary | ICD-10-CM | POA: Diagnosis not present

## 2014-05-08 DIAGNOSIS — M549 Dorsalgia, unspecified: Secondary | ICD-10-CM

## 2014-05-08 DIAGNOSIS — Y998 Other external cause status: Secondary | ICD-10-CM | POA: Insufficient documentation

## 2014-05-08 DIAGNOSIS — Y92410 Unspecified street and highway as the place of occurrence of the external cause: Secondary | ICD-10-CM | POA: Insufficient documentation

## 2014-05-08 DIAGNOSIS — Z79899 Other long term (current) drug therapy: Secondary | ICD-10-CM | POA: Diagnosis not present

## 2014-05-08 DIAGNOSIS — Z87440 Personal history of urinary (tract) infections: Secondary | ICD-10-CM | POA: Diagnosis not present

## 2014-05-08 DIAGNOSIS — S3992XA Unspecified injury of lower back, initial encounter: Secondary | ICD-10-CM | POA: Diagnosis not present

## 2014-05-08 DIAGNOSIS — Y9389 Activity, other specified: Secondary | ICD-10-CM | POA: Insufficient documentation

## 2014-05-08 DIAGNOSIS — Z792 Long term (current) use of antibiotics: Secondary | ICD-10-CM | POA: Diagnosis not present

## 2014-05-08 DIAGNOSIS — R0789 Other chest pain: Secondary | ICD-10-CM

## 2014-05-08 HISTORY — DX: Urinary tract infection, site not specified: N39.0

## 2014-05-08 MED ORDER — IBUPROFEN 800 MG PO TABS
800.0000 mg | ORAL_TABLET | Freq: Once | ORAL | Status: AC
  Administered 2014-05-08: 800 mg via ORAL
  Filled 2014-05-08: qty 1

## 2014-05-08 MED ORDER — IBUPROFEN 800 MG PO TABS: 800.0000 mg | ORAL_TABLET | Freq: Three times a day (TID) | ORAL | Status: DC
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
## 2014-05-08 NOTE — Progress Notes (Signed)
pcp is [facility] [place] [id number] [phone]

## 2014-05-08 NOTE — Discharge Instructions (Signed)
Take the prescribed medication as directed. °Follow-up with your primary care physician. °Return to the ED for new or worsening symptoms. ° °

## 2014-06-27 ENCOUNTER — Ambulatory Visit: Payer: Medicaid Other | Admitting: Obstetrics & Gynecology

## 2014-07-25 ENCOUNTER — Encounter: Payer: Self-pay | Admitting: Obstetrics & Gynecology

## 2014-07-25 ENCOUNTER — Ambulatory Visit (INDEPENDENT_AMBULATORY_CARE_PROVIDER_SITE_OTHER): Payer: Medicaid Other | Admitting: Obstetrics & Gynecology

## 2014-07-25 VITALS — BP 116/79 | HR 79 | Ht 64.0 in | Wt 160.4 lb

## 2014-07-25 DIAGNOSIS — Z30432 Encounter for removal of intrauterine contraceptive device: Secondary | ICD-10-CM

## 2014-07-25 MED ORDER — PRENATAL VITAMINS 0.8 MG PO TABS: 1.0000 | ORAL_TABLET | Freq: Every day | ORAL | Status: DC

## 2014-07-25 NOTE — Progress Notes (Signed)
Patient ID: Diana Torres, female   DOB: Dec 03, 1990, 24 y.o.   MRN: [id number] Patient was in the dorsal lithotomy position, normal external genitalia was noted.  A speculum was placed in the patient's vagina, normal discharge was noted, no lesions. The multiparous cervix was visualized, no lesions, no abnormal discharge;  and the cervix was swabbed with Betadine using scopettes. The strings of the IUD were not visualized, so Kelly forceps were introduced into the endometrial cavity and the IUD was grasped and removed in its entirety.  Patient tolerated the procedure well.    Pt plans to conceive.  PNVs were prescribed.  Srinika Delone L. Harraway-Smith, M.D., Cherlynn June

## 2014-07-25 NOTE — Progress Notes (Signed)
Interpreter Tommie Sams

## 2014-07-25 NOTE — Patient Instructions (Signed)
Contraception Choices Contraception (birth control) is the use of any methods or devices to prevent pregnancy. Below are some methods to help avoid pregnancy. HORMONAL METHODS   Contraceptive implant. This is a thin, plastic tube containing progesterone hormone. It does not contain estrogen hormone. Your health care provider inserts the tube in the inner part of the upper arm. The tube can remain in place for up to 3 years. After 3 years, the implant must be removed. The implant prevents the ovaries from releasing an egg (ovulation), thickens the cervical mucus to prevent sperm from entering the uterus, and thins the lining of the inside of the uterus.  Progesterone-only injections. These injections are given every 3 months by your health care provider to prevent pregnancy. This synthetic progesterone hormone stops the ovaries from releasing eggs. It also thickens cervical mucus and changes the uterine lining. This makes it harder for sperm to survive in the uterus.  Birth control pills. These pills contain estrogen and progesterone hormone. They work by preventing the ovaries from releasing eggs (ovulation). They also cause the cervical mucus to thicken, preventing the sperm from entering the uterus. Birth control pills are prescribed by a health care provider.Birth control pills can also be used to treat heavy periods.  Minipill. This type of birth control pill contains only the progesterone hormone. They are taken every day of each month and must be prescribed by your health care provider.  Birth control patch. The patch contains hormones similar to those in birth control pills. It must be changed once a week and is prescribed by a health care provider.  Vaginal ring. The ring contains hormones similar to those in birth control pills. It is left in the vagina for 3 weeks, removed for 1 week, and then a new one is put back in place. The patient must be comfortable inserting and removing the ring  from the vagina.A health care provider's prescription is necessary.  Emergency contraception. Emergency contraceptives prevent pregnancy after unprotected sexual intercourse. This pill can be taken right after sex or up to 5 days after unprotected sex. It is most effective the sooner you take the pills after having sexual intercourse. Most emergency contraceptive pills are available without a prescription. Check with your pharmacist. Do not use emergency contraception as your only form of birth control. BARRIER METHODS   Female condom. This is a thin sheath (latex or rubber) that is worn over the penis during sexual intercourse. It can be used with spermicide to increase effectiveness.  Female condom. This is a soft, loose-fitting sheath that is put into the vagina before sexual intercourse.  Diaphragm. This is a soft, latex, dome-shaped barrier that must be fitted by a health care provider. It is inserted into the vagina, along with a spermicidal jelly. It is inserted before intercourse. The diaphragm should be left in the vagina for 6 to 8 hours after intercourse.  Cervical cap. This is a round, soft, latex or plastic cup that fits over the cervix and must be fitted by a health care provider. The cap can be left in place for up to 48 hours after intercourse.  Sponge. This is a soft, circular piece of polyurethane foam. The sponge has spermicide in it. It is inserted into the vagina after wetting it and before sexual intercourse.  Spermicides. These are chemicals that kill or block sperm from entering the cervix and uterus. They come in the form of creams, jellies, suppositories, foam, or tablets. They do not require a   prescription. They are inserted into the vagina with an applicator before having sexual intercourse. The process must be repeated every time you have sexual intercourse. INTRAUTERINE CONTRACEPTION  Intrauterine device (IUD). This is a T-shaped device that is put in a woman's uterus  during a menstrual period to prevent pregnancy. There are 2 types:  Copper IUD. This type of IUD is wrapped in copper wire and is placed inside the uterus. Copper makes the uterus and fallopian tubes produce a fluid that kills sperm. It can stay in place for 10 years.  Hormone IUD. This type of IUD contains the hormone progestin (synthetic progesterone). The hormone thickens the cervical mucus and prevents sperm from entering the uterus, and it also thins the uterine lining to prevent implantation of a fertilized egg. The hormone can weaken or kill the sperm that get into the uterus. It can stay in place for 3-5 years, depending on which type of IUD is used. PERMANENT METHODS OF CONTRACEPTION  Female tubal ligation. This is when the woman's fallopian tubes are surgically sealed, tied, or blocked to prevent the egg from traveling to the uterus.  Hysteroscopic sterilization. This involves placing a small coil or insert into each fallopian tube. Your doctor uses a technique called hysteroscopy to do the procedure. The device causes scar tissue to form. This results in permanent blockage of the fallopian tubes, so the sperm cannot fertilize the egg. It takes about 3 months after the procedure for the tubes to become blocked. You must use another form of birth control for these 3 months.  Female sterilization. This is when the female has the tubes that carry sperm tied off (vasectomy).This blocks sperm from entering the vagina during sexual intercourse. After the procedure, the man can still ejaculate fluid (semen). NATURAL PLANNING METHODS  Natural family planning. This is not having sexual intercourse or using a barrier method (condom, diaphragm, cervical cap) on days the woman could become pregnant.  Calendar method. This is keeping track of the length of each menstrual cycle and identifying when you are fertile.  Ovulation method. This is avoiding sexual intercourse during ovulation.  Symptothermal  method. This is avoiding sexual intercourse during ovulation, using a thermometer and ovulation symptoms.  Post-ovulation method. This is timing sexual intercourse after you have ovulated. Regardless of which type or method of contraception you choose, it is important that you use condoms to protect against the transmission of sexually transmitted infections (STIs). Talk with your health care provider about which form of contraception is most appropriate for you. Document Released: 03/22/2005 Document Revised: 03/27/2013 Document Reviewed: 09/14/2012 Exit[employer] Patient Information 2015 [employer]. This information is not intended to replace advice given to you by your health care provider. Make sure you discuss any questions you have with your health care provider.  

## 2014-07-30 ENCOUNTER — Inpatient Hospital Stay (HOSPITAL_COMMUNITY)
Admission: AD | Admit: 2014-07-30 | Discharge: 2014-07-30 | Disposition: A | Discharge status: Home / Self Care | Payer: Medicaid Other | Source: Ambulatory Visit | Attending: Obstetrics & Gynecology | Admitting: Obstetrics & Gynecology

## 2014-07-30 ENCOUNTER — Encounter (HOSPITAL_COMMUNITY): Payer: Self-pay

## 2014-07-30 DIAGNOSIS — N939 Abnormal uterine and vaginal bleeding, unspecified: Secondary | ICD-10-CM | POA: Diagnosis present

## 2014-07-30 DIAGNOSIS — R1030 Lower abdominal pain, unspecified: Secondary | ICD-10-CM | POA: Diagnosis not present

## 2014-07-30 LAB — URINALYSIS, ROUTINE W REFLEX MICROSCOPIC
Bilirubin Urine: NEGATIVE
GLUCOSE, UA: NEGATIVE mg/dL
Ketones, ur: NEGATIVE mg/dL
Leukocytes, UA: NEGATIVE
Nitrite: NEGATIVE
PH: 5.5 (ref 5.0–8.0)
PROTEIN: NEGATIVE mg/dL
SPECIFIC GRAVITY, URINE: 1.02 (ref 1.005–1.030)
Urobilinogen, UA: 0.2 mg/dL (ref 0.0–1.0)

## 2014-07-30 LAB — URINE MICROSCOPIC-ADD ON

## 2014-07-30 LAB — POCT PREGNANCY, URINE: PREG TEST UR: NEGATIVE

## 2014-07-30 NOTE — MAU Provider Note (Signed)
History     CSN: [id number]  Arrival date and time: 07/30/14 8144   First Provider Initiated Contact with Patient 07/30/14 0135      Chief Complaint  Patient presents with  . Vaginal Bleeding  . Possible Pregnancy   HPI Comments: Diana Torres is a 24 y.o. G1P1001 who presents today with vaginal bleeding. She had her IUD removed on 07/25/14, and she started bleeding on 07/27/14. She was having a lot of cramping, and tonight she passed a clot about the size of a grape. She was concerned that she was pregnant, and that she was having a miscarriage. She had not taken a pregnancy test.   Vaginal Bleeding The patient's primary symptoms include vaginal bleeding. This is a new problem. The current episode started in the past 7 days. The problem occurs constantly. The problem has been unchanged. The pain is moderate (4/10). The problem affects both sides. She is not pregnant. Associated symptoms include abdominal pain and headaches. Pertinent negatives include no constipation, diarrhea, dysuria, fever, frequency, nausea, urgency or vomiting. The vaginal discharge was bloody. The vaginal bleeding is heavier than menses. She has been passing clots. She has not been passing tissue. Nothing aggravates the symptoms. She has tried nothing for the symptoms. She is sexually active. No, her partner does not have an STD. She uses nothing (planing a pregnancy ) for contraception. Menstrual history: no periods while she had the IUD in place.     Past Medical History  Diagnosis Date  . Deaf   . Medical history non-contributory   . UTI (lower urinary tract infection)     Past Surgical History  Procedure Laterality Date  . Tonsillectomy    . Cochlear implant    . Rhinoplasty    . Right foot  2016    cyst removed    No family history on file.  History  Substance Use Topics  . Smoking status: Never Smoker   . Smokeless tobacco: Never Used  . Alcohol Use: No    Allergies:  Allergies  Allergen  Reactions  . Codeine Itching and Rash    Prescriptions prior to admission  Medication Sig Dispense Refill Last Dose  . acetaminophen (TYLENOL) 500 MG tablet Take 500 mg by mouth every 6 (six) hours as needed.   07/29/2014 at 1530  . acetaminophen (TYLENOL) 325 MG tablet Take 2 tablets (650 mg total) by mouth every 6 (six) hours as needed. (Patient not taking: Reported on 07/25/2014) 30 tablet 0 Not Taking  . amoxicillin (AMOXIL) 500 MG tablet Take 2 tablets (1,000 mg total) by mouth 2 (two) times daily. (Patient not taking: Reported on 07/25/2014) 28 tablet 0 Not Taking  . guaiFENesin-dextromethorphan (ROBITUSSIN DM) 100-10 MG/5ML syrup Take 5 mLs by mouth every 4 (four) hours as needed for cough. (Patient not taking: Reported on 07/25/2014) 118 mL 0 Not Taking  . ibuprofen (ADVIL,MOTRIN) 800 MG tablet Take 1 tablet (800 mg total) by mouth 3 (three) times daily. (Patient not taking: Reported on 07/25/2014) 30 tablet 0 Not Taking  . Prenatal Multivit-Min-Fe-FA (PRENATAL VITAMINS) 0.8 MG tablet Take 1 tablet by mouth daily. 30 tablet 12   . Pseudoephedrine-APAP-DM (DAYQUIL PO) Take 2 capsules by mouth every 4 (four) hours as needed (pain / congestion).   Not Taking    Review of Systems  Constitutional: Negative for fever.  Gastrointestinal: Positive for abdominal pain. Negative for nausea, vomiting, diarrhea and constipation.  Genitourinary: Positive for vaginal bleeding. Negative for dysuria, urgency and frequency.  Neurological: Positive for headaches.   Physical Exam   Blood pressure 119/87, pulse 84, temperature 98.2 F (36.8 C), temperature source Oral, resp. rate 16, last menstrual period 06/27/2014, SpO2 100 %, not currently breastfeeding.  Physical Exam  Nursing note and vitals reviewed. Constitutional: She is oriented to person, place, and time. She appears well-developed and well-nourished. No distress.  Cardiovascular: Normal rate.   GI: Soft. There is no tenderness. There is no  rebound.  Neurological: She is alert and oriented to person, place, and time.  Skin: Skin is warm and dry.  Psychiatric: She has a normal mood and affect.   Results for orders placed or performed during the hospital encounter of 07/30/14 (from the past 24 hour(s))  Urinalysis, Routine w reflex microscopic     Status: Abnormal   Collection Time: 07/30/14  1:03 AM  Result Value Ref Range   Color, Urine YELLOW YELLOW   APPearance CLEAR CLEAR   Specific Gravity, Urine 1.020 1.005 - 1.030   pH 5.5 5.0 - 8.0   Glucose, UA NEGATIVE NEGATIVE mg/dL   Hgb urine dipstick LARGE (A) NEGATIVE   Bilirubin Urine NEGATIVE NEGATIVE   Ketones, ur NEGATIVE NEGATIVE mg/dL   Protein, ur NEGATIVE NEGATIVE mg/dL   Urobilinogen, UA 0.2 0.0 - 1.0 mg/dL   Nitrite NEGATIVE NEGATIVE   Leukocytes, UA NEGATIVE NEGATIVE  Urine microscopic-add on     Status: None   Collection Time: 07/30/14  1:03 AM  Result Value Ref Range   Squamous Epithelial / LPF RARE RARE   WBC, UA 0-2 <3 WBC/hpf   RBC / HPF 3-6 <3 RBC/hpf   Bacteria, UA RARE RARE  Pregnancy, urine POC     Status: None   Collection Time: 07/30/14  1:42 AM  Result Value Ref Range   Preg Test, Ur NEGATIVE NEGATIVE    MAU Course  Procedures  MDM   Assessment and Plan   1. Menstrual bleeding problem    DC home Preconception counseling today Start PNV Chart cycles to help with conception Return to MAU as needed  Follow-up Information    Follow up with [facility].   Specialty:  Obstetrics and Gynecology   Why:  As needed   Contact information:   [place] [phone]       Mathis Bud 07/30/2014, 1:39 AM

## 2014-07-30 NOTE — Discharge Instructions (Signed)
Preparing for Pregnancy Before trying to become pregnant, make an appointment with your health care provider (preconception care). The goal is to help you have a healthy, safe pregnancy. At your first appointment, your health care provider will:   Do a complete physical exam, including a Pap test.  Take a complete medical history.  Give you advice and help you resolve any problems. PRECONCEPTION CHECKLIST Here is a list of the basics to cover with your health care provider at your preconception visit:  Medical history.  Tell your health care provider about any diseases you have had. Many diseases can affect your pregnancy.  Include your partner's medical history and family history.  Make sure you have been tested for sexually transmitted infections (STIs). These can affect your pregnancy. In some cases, they can be passed to your baby. Tell your health care provider about any history of STIs.  Make sure your health care provider knows about any previous problems you have had with conception or pregnancy.  Tell your health care provider about any medicine you take. This includes herbal supplements and over-the-counter medicines.  Make sure all your immunizations are up to date. You may need to make additional appointments.  Ask your health care provider if you need any vaccinations or if there are any you should avoid.  Diet.  It is especially important to eat a healthy, balanced diet with the right nutrients when you are pregnant.  Ask your health care provider to help you get to a healthy weight before pregnancy.  If you are overweight, you are at higher risk for certain complications. These include high blood pressure, diabetes, and preterm birth.  If you are underweight, you are more likely to have a low-birth-weight baby.  Lifestyle.  Tell your health care provider about lifestyle factors such as alcohol use, drug use, or smoking.  Describe any harmful substances you may  be exposed to at work or home. These can include chemicals, pesticides, and radiation.  Mental health.  Let your health care provider know if you have been feeling depressed or anxious.  Let your health care provider know if you have a history of substance abuse.  Let your health care provider know if you do not feel safe at home. HOME INSTRUCTIONS TO PREPARE FOR PREGNANCY Follow your health care provider's advice and instructions.   Keep an accurate record of your menstrual periods. This makes it easier for your health care provider to determine your baby's due date.  Begin taking prenatal vitamins and folic acid supplements daily. Take them as directed by your health care provider.  Eat a balanced diet. Get help from a nutrition counselor if you have questions or need help.  Get regular exercise. Try to be active for at least 30 minutes a day most days of the week.  Quit smoking, if you smoke.  Do not drink alcohol.  Do not take illegal drugs.  Get medical problems, such as diabetes or high blood pressure, under control.  If you have diabetes, make sure you do the following:  Have good blood sugar control. If you have type 1 diabetes, use multiple daily doses of insulin. Do not use split-dose or premixed insulin.  Have an eye exam by a qualified eye care professional trained in caring for people with diabetes.  Get evaluated by your health care provider for cardiovascular disease.  Get to a healthy weight. If you are overweight or obese, reduce your weight with the help of a qualified health  professional such as a Firefighter. Ask your health care provider what the right weight range is for you. HOW DO I KNOW I AM PREGNANT? You may be pregnant if you have been sexually active and you miss your period. Symptoms of early pregnancy include:   Mild cramping.  Very light vaginal bleeding (spotting).  Feeling unusually tired.  Morning sickness. If you have any of  these symptoms, take a home pregnancy test. These tests look for a hormone called human chorionic gonadotropin (hCG) in your urine. Your body begins to make this hormone during early pregnancy. These tests are very accurate. Wait until at least the first day you miss your period to take one. If you get a positive result, call your health care provider to make appointments for prenatal care. WHAT SHOULD I DO IF I BECOME PREGNANT?  Make an appointment with your health care provider by week 12 of your pregnancy at the latest.  Do not smoke. Smoking can be harmful to your baby.  Do not drink alcoholic beverages. Alcohol is related to a number of birth defects.  Avoid toxic odors and chemicals.  You may continue to have sexual intercourse if it does not cause pain or other problems, such as vaginal bleeding. Document Released: 03/04/2008 Document Revised: 08/06/2013 Document Reviewed: 02/26/2013 [facility] Patient Information 2015 [place], [place]. This information is not intended to replace advice given to you by your health care provider. Make sure you discuss any questions you have with your health care provider.

## 2014-07-30 NOTE — MAU Note (Signed)
IUD removed in clinic on 07/25/14. Started bleeding 2 days later and passed a large clot tonight; thinks she had a miscarriage. Did not have periods while IUD was in place. Has not had a positive pregnancy test. Had lower abdominal pain that has decreased since passing clot.

## 2015-02-22 ENCOUNTER — Inpatient Hospital Stay (HOSPITAL_COMMUNITY)
Admission: AD | Admit: 2015-02-22 | Discharge: 2015-02-22 | Disposition: A | Discharge status: Home / Self Care | Payer: Medicaid Other | Source: Ambulatory Visit | Attending: Obstetrics and Gynecology | Admitting: Obstetrics and Gynecology

## 2015-02-22 ENCOUNTER — Inpatient Hospital Stay (HOSPITAL_COMMUNITY): Payer: Medicaid Other

## 2015-02-22 ENCOUNTER — Encounter (HOSPITAL_COMMUNITY): Payer: Self-pay | Admitting: *Deleted

## 2015-02-22 DIAGNOSIS — O9989 Other specified diseases and conditions complicating pregnancy, childbirth and the puerperium: Secondary | ICD-10-CM | POA: Diagnosis not present

## 2015-02-22 DIAGNOSIS — O26899 Other specified pregnancy related conditions, unspecified trimester: Secondary | ICD-10-CM

## 2015-02-22 DIAGNOSIS — R109 Unspecified abdominal pain: Secondary | ICD-10-CM | POA: Diagnosis not present

## 2015-02-22 DIAGNOSIS — H9190 Unspecified hearing loss, unspecified ear: Secondary | ICD-10-CM | POA: Insufficient documentation

## 2015-02-22 DIAGNOSIS — Z3A01 Less than 8 weeks gestation of pregnancy: Secondary | ICD-10-CM | POA: Insufficient documentation

## 2015-02-22 DIAGNOSIS — O26891 Other specified pregnancy related conditions, first trimester: Secondary | ICD-10-CM | POA: Diagnosis not present

## 2015-02-22 DIAGNOSIS — K5900 Constipation, unspecified: Secondary | ICD-10-CM | POA: Diagnosis not present

## 2015-02-22 DIAGNOSIS — O3680X0 Pregnancy with inconclusive fetal viability, not applicable or unspecified: Secondary | ICD-10-CM

## 2015-02-22 LAB — URINALYSIS, ROUTINE W REFLEX MICROSCOPIC
Bilirubin Urine: NEGATIVE
Glucose, UA: NEGATIVE mg/dL
HGB URINE DIPSTICK: NEGATIVE
Ketones, ur: NEGATIVE mg/dL
Leukocytes, UA: NEGATIVE
Nitrite: NEGATIVE
PROTEIN: NEGATIVE mg/dL
Specific Gravity, Urine: 1.015 (ref 1.005–1.030)
pH: 6.5 (ref 5.0–8.0)

## 2015-02-22 LAB — CBC
HCT: 36 % (ref 36.0–46.0)
HEMOGLOBIN: 12 g/dL (ref 12.0–15.0)
MCH: 29.1 pg (ref 26.0–34.0)
MCHC: 33.3 g/dL (ref 30.0–36.0)
MCV: 87.4 fL (ref 78.0–100.0)
Platelets: 246 10*3/uL (ref 150–400)
RBC: 4.12 MIL/uL (ref 3.87–5.11)
RDW: 12.9 % (ref 11.5–15.5)
WBC: 9.6 10*3/uL (ref 4.0–10.5)

## 2015-02-22 LAB — WET PREP, GENITAL
CLUE CELLS WET PREP: NONE SEEN
SPERM: NONE SEEN
Trich, Wet Prep: NONE SEEN
Yeast Wet Prep HPF POC: NONE SEEN

## 2015-02-22 LAB — ABO/RH: ABO/RH(D): O POS

## 2015-02-22 LAB — POCT PREGNANCY, URINE: Preg Test, Ur: POSITIVE — AB

## 2015-02-22 LAB — HCG, QUANTITATIVE, PREGNANCY: hCG, Beta Chain, Quant, S: 224 m[IU]/mL — ABNORMAL HIGH (ref <5)

## 2015-02-22 MED ORDER — PROMETHAZINE HCL 25 MG PO TABS: 25.0000 mg | ORAL_TABLET | Freq: Four times a day (QID) | ORAL | Status: DC | PRN

## 2015-02-22 MED ORDER — DOCUSATE SODIUM 100 MG PO CAPS: 100.0000 mg | ORAL_CAPSULE | Freq: Two times a day (BID) | ORAL | Status: DC

## 2015-02-22 NOTE — Progress Notes (Signed)
Jorje Guild NP in with Gwenlyn Found, Deaf Interpreter, to go over test results and d/c plan. Understanding indicated. Will return Monday for repeat labs and sooner for any concerns.

## 2015-02-22 NOTE — Discharge Instructions (Signed)
Abdominal Pain During Pregnancy Abdominal pain is common in pregnancy. Most of the time, it does not cause harm. There are many causes of abdominal pain. Some causes are more serious than others. Some of the causes of abdominal pain in pregnancy are easily diagnosed. Occasionally, the diagnosis takes time to understand. Other times, the cause is not determined. Abdominal pain can be a sign that something is very wrong with the pregnancy, or the pain may have nothing to do with the pregnancy at all. For this reason, always tell your health care provider if you have any abdominal discomfort. HOME CARE INSTRUCTIONS  Monitor your abdominal pain for any changes. The following actions may help to alleviate any discomfort you are experiencing:  Do not have sexual intercourse or put anything in your vagina until your symptoms go away completely.  Get plenty of rest until your pain improves.  Drink clear fluids if you feel nauseous. Avoid solid food as long as you are uncomfortable or nauseous.  Only take over-the-counter or prescription medicine as directed by your health care provider.  Keep all follow-up appointments with your health care provider. SEEK IMMEDIATE MEDICAL CARE IF:  You are bleeding, leaking fluid, or passing tissue from the vagina.  You have increasing pain or cramping.  You have persistent vomiting.  You have painful or bloody urination.  You have a fever.  You notice a decrease in your baby's movements.  You have extreme weakness or feel faint.  You have shortness of breath, with or without abdominal pain.  You develop a severe headache with abdominal pain.  You have abnormal vaginal discharge with abdominal pain.  You have persistent diarrhea.  You have abdominal pain that continues even after rest, or gets worse. MAKE SURE YOU:   Understand these instructions.  Will watch your condition.  Will get help right away if you are not doing well or get worse.     This information is not intended to replace advice given to you by your health care provider. Make sure you discuss any questions you have with your health care provider.   Document Released: 03/22/2005 Document Revised: 01/10/2013 Document Reviewed: 10/19/2012 Elsevier Interactive Patient Education 2016 [employer]. Constipation, Adult Constipation is when a person has fewer than three bowel movements a week, has difficulty having a bowel movement, or has stools that are dry, hard, or larger than normal. As people grow older, constipation is more common. A low-fiber diet, not taking in enough fluids, and taking certain medicines may make constipation worse.  CAUSES   Certain medicines, such as antidepressants, pain medicine, iron supplements, antacids, and water pills.   Certain diseases, such as diabetes, irritable bowel syndrome (IBS), thyroid disease, or depression.   Not drinking enough water.   Not eating enough fiber-rich foods.   Stress or travel.   Lack of physical activity or exercise.   Ignoring the urge to have a bowel movement.   Using laxatives too much.  SIGNS AND SYMPTOMS   Having fewer than three bowel movements a week.   Straining to have a bowel movement.   Having stools that are hard, dry, or larger than normal.   Feeling full or bloated.   Pain in the lower abdomen.   Not feeling relief after having a bowel movement.  DIAGNOSIS  Your health care provider will take a medical history and perform a physical exam. Further testing may be done for severe constipation. Some tests may include:  A barium enema X-ray to  examine your rectum, colon, and, sometimes, your small intestine.   A sigmoidoscopy to examine your lower colon.   A colonoscopy to examine your entire colon. TREATMENT  Treatment will depend on the severity of your constipation and what is causing it. Some dietary treatments include drinking more fluids and eating more  fiber-rich foods. Lifestyle treatments may include regular exercise. If these diet and lifestyle recommendations do not help, your health care provider may recommend taking over-the-counter laxative medicines to help you have bowel movements. Prescription medicines may be prescribed if over-the-counter medicines do not work.  HOME CARE INSTRUCTIONS   Eat foods that have a lot of fiber, such as fruits, vegetables, whole grains, and beans.  Limit foods high in fat and processed sugars, such as french fries, hamburgers, cookies, candies, and soda.   A fiber supplement may be added to your diet if you cannot get enough fiber from foods.   Drink enough fluids to keep your urine clear or pale yellow.   Exercise regularly or as directed by your health care provider.   Go to the restroom when you have the urge to go. Do not hold it.   Only take over-the-counter or prescription medicines as directed by your health care provider. Do not take other medicines for constipation without talking to your health care provider first.  [place] IF:   You have bright red blood in your stool.   Your constipation lasts for more than 4 days or gets worse.   You have abdominal or rectal pain.   You have thin, pencil-like stools.   You have unexplained weight loss. MAKE SURE YOU:   Understand these instructions.  Will watch your condition.  Will get help right away if you are not doing well or get worse.   This information is not intended to replace advice given to you by your health care provider. Make sure you discuss any questions you have with your health care provider.   Document Released: 12/19/2003 Document Revised: 04/12/2014 Document Reviewed: 01/01/2013 Elsevier Interactive Patient Education [employer].

## 2015-02-22 NOTE — MAU Note (Addendum)
Found out she was preg today, having some cramps this past wk

## 2015-02-22 NOTE — MAU Provider Note (Signed)
History     CSN: [id number]  Arrival date and time: 02/22/15 1615   None      Chief Complaint  Patient presents with  . Abdominal Pain  . Possible Pregnancy   HPI  Diana Torres is a 24 y.o. G2P1001 at [redacted]w[redacted]d who presents for abdominal pain.  Pain x 1 week. Lower abdominal cramping that comes & goes. Denies vaginal bleeding or discharge.  Reports some constipation. Last BM today was hard & had to strain.  Some nausea, no vomiting.  Denies fever.   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  OB History    Gravida Para Term Preterm AB TAB SAB Ectopic Multiple Living   2 1 1       1       Past Medical History  Diagnosis Date  . Deaf   . Medical history non-contributory   . UTI (lower urinary tract infection)     Past Surgical History  Procedure Laterality Date  . Tonsillectomy    . Cochlear implant    . Rhinoplasty    . Right foot  2016    cyst removed    No family history on file.  Social History  Substance Use Topics  . Smoking status: Never Smoker   . Smokeless tobacco: Never Used  . Alcohol Use: No    Allergies:  Allergies  Allergen Reactions  . Codeine Itching and Rash    Prescriptions prior to admission  Medication Sig Dispense Refill Last Dose  . Prenatal Vit-Fe Fumarate-FA (PRENATAL MULTIVITAMIN) TABS tablet Take 1 tablet by mouth daily.   02/22/2015 at Unknown time    Review of Systems  Constitutional: Negative.   Gastrointestinal: Positive for nausea, abdominal pain and constipation. Negative for vomiting and diarrhea.  Genitourinary: Negative.    Physical Exam   Blood pressure 124/76, pulse 101, temperature 98.6 F (37 C), temperature source Oral, resp. rate 18, height 5\' 3"  (1.6 m), weight 159 lb 3.2 oz (72.213 kg), last menstrual period 01/18/2015.  Physical Exam  Nursing note and vitals reviewed. Constitutional: She is oriented to person, place, and time. She appears well-developed and well-nourished. No distress.  HENT:  Head:  Normocephalic and atraumatic.  Eyes: Conjunctivae are normal. Right eye exhibits no discharge. Left eye exhibits no discharge. No scleral icterus.  Neck: Normal range of motion.  Cardiovascular: Normal rate, regular rhythm and normal heart sounds.   No murmur heard. Respiratory: Effort normal and breath sounds normal. No respiratory distress. She has no wheezes.  GI: Soft. Bowel sounds are normal. She exhibits no distension. There is no tenderness.  Genitourinary: Vagina normal and uterus normal. Cervix exhibits discharge (minimal amount of mucoid discharge). Cervix exhibits no motion tenderness and no friability. Right adnexum displays no mass, no tenderness and no fullness. Left adnexum displays no mass, no tenderness and no fullness.  Cervix closed  Neurological: She is alert and oriented to person, place, and time.  Skin: Skin is warm and dry. She is not diaphoretic.  Psychiatric: She has a normal mood and affect. Her behavior is normal. Judgment and thought content normal.    MAU Course  Procedures Results for orders placed or performed during the hospital encounter of 02/22/15 (from the past 24 hour(s))  Urinalysis, Routine w reflex microscopic (not at [facility])     Status: None   Collection Time: 02/22/15  4:35 PM  Result Value Ref Range   Color, Urine YELLOW YELLOW   APPearance CLEAR CLEAR   Specific Gravity, Urine  1.015 1.005 - 1.030   pH 6.5 5.0 - 8.0   Glucose, UA NEGATIVE NEGATIVE mg/dL   Hgb urine dipstick NEGATIVE NEGATIVE   Bilirubin Urine NEGATIVE NEGATIVE   Ketones, ur NEGATIVE NEGATIVE mg/dL   Protein, ur NEGATIVE NEGATIVE mg/dL   Nitrite NEGATIVE NEGATIVE   Leukocytes, UA NEGATIVE NEGATIVE  Pregnancy, urine POC     Status: Abnormal   Collection Time: 02/22/15  4:42 PM  Result Value Ref Range   Preg Test, Ur POSITIVE (A) NEGATIVE  CBC     Status: None   Collection Time: 02/22/15  5:12 PM  Result Value Ref Range   WBC 9.6 4.0 - 10.5 K/uL   RBC 4.12 3.87 - 5.11  MIL/uL   Hemoglobin 12.0 12.0 - 15.0 g/dL   HCT 36.0 36.0 - 46.0 %   MCV 87.4 78.0 - 100.0 fL   MCH 29.1 26.0 - 34.0 pg   MCHC 33.3 30.0 - 36.0 g/dL   RDW 12.9 11.5 - 15.5 %   Platelets 246 150 - 400 K/uL  ABO/Rh     Status: None   Collection Time: 02/22/15  5:12 PM  Result Value Ref Range   ABO/RH(D) O POS   hCG, quantitative, pregnancy     Status: Abnormal   Collection Time: 02/22/15  5:12 PM  Result Value Ref Range   hCG, Beta Chain, Quant, S 224 (H) <5 mIU/mL   US Ob Comp Less 14 Wks  02/22/2015  CLINICAL DATA:  Pain. Cramping since 11/12. Beta HCG of 224. Five weeks 0 days by last menstrual. EXAM: OBSTETRIC <14 WK [place] AND TRANSVAGINAL OB US TECHNIQUE: Both transabdominal and transvaginal ultrasound examinations were performed for complete evaluation of the gestation as well as the maternal uterus, adnexal regions, and pelvic cul-de-sac. Transvaginal technique was performed to assess early pregnancy. COMPARISON:  Pelvic ultrasound 09/25/2013 FINDINGS: Intrauterine gestational sac: Not visualized. Mildly heterogeneous endometrial thickening is nonspecific. Yolk sac:  Absent Embryo:  Absent Maternal uterus/adnexae: Both ovaries are normal in appearance. No adnexal mass. No significant free fluid. IMPRESSION: Absence of intrauterine gestational sac. Given low beta HCG level and early gestational age, this is most likely indicative of an early intrauterine pregnancy. However, differential considerations include missed abortion or otherwise occult ectopic pregnancy. Beta HCG and possibly ultrasound follow-up suggested. Electronically Signed   By: Abigail Miyamoto M.D.   On: 02/22/2015 18:46   US Ob Transvaginal  02/22/2015  CLINICAL DATA:  Pain. Cramping since 11/12. Beta HCG of 224. Five weeks 0 days by last menstrual. EXAM: OBSTETRIC <14 WK [place] AND TRANSVAGINAL OB US TECHNIQUE: Both transabdominal and transvaginal ultrasound examinations were performed for complete evaluation of the gestation as  well as the maternal uterus, adnexal regions, and pelvic cul-de-sac. Transvaginal technique was performed to assess early pregnancy. COMPARISON:  Pelvic ultrasound 09/25/2013 FINDINGS: Intrauterine gestational sac: Not visualized. Mildly heterogeneous endometrial thickening is nonspecific. Yolk sac:  Absent Embryo:  Absent Maternal uterus/adnexae: Both ovaries are normal in appearance. No adnexal mass. No significant free fluid. IMPRESSION: Absence of intrauterine gestational sac. Given low beta HCG level and early gestational age, this is most likely indicative of an early intrauterine pregnancy. However, differential considerations include missed abortion or otherwise occult ectopic pregnancy. Beta HCG and possibly ultrasound follow-up suggested. Electronically Signed   By: Abigail Miyamoto M.D.   On: 02/22/2015 18:46    MDM O positive Ultrasound - no IUP Cannot r/o ectopic at this time; will have patient f/u in 48 hrs for  BHCG.  Assessment and Plan  A: 1. Pregnancy of unknown anatomic location   2. Abdominal pain in pregnancy   3. Constipation, unspecified constipation type    P: Discharge home Rx phenergan & colace Increase fiber intake Return in 48 hrs for bhcg Ectopic precautions given  Jorje Guild, NP  02/22/2015, 5:59 PM

## 2015-02-23 LAB — HIV ANTIBODY (ROUTINE TESTING W REFLEX): HIV Screen 4th Generation wRfx: NONREACTIVE

## 2015-02-24 ENCOUNTER — Inpatient Hospital Stay (HOSPITAL_COMMUNITY)
Admission: AD | Admit: 2015-02-24 | Discharge: 2015-02-24 | Disposition: A | Discharge status: Home / Self Care | Payer: Medicaid Other | Source: Ambulatory Visit | Attending: Family Medicine | Admitting: Family Medicine

## 2015-02-24 DIAGNOSIS — O3680X0 Pregnancy with inconclusive fetal viability, not applicable or unspecified: Secondary | ICD-10-CM

## 2015-02-24 DIAGNOSIS — R1030 Lower abdominal pain, unspecified: Secondary | ICD-10-CM | POA: Diagnosis present

## 2015-02-24 DIAGNOSIS — O0281 Inappropriate change in quantitative human chorionic gonadotropin (hCG) in early pregnancy: Secondary | ICD-10-CM

## 2015-02-24 DIAGNOSIS — Z3A00 Weeks of gestation of pregnancy not specified: Secondary | ICD-10-CM | POA: Diagnosis not present

## 2015-02-24 DIAGNOSIS — O26891 Other specified pregnancy related conditions, first trimester: Secondary | ICD-10-CM | POA: Insufficient documentation

## 2015-02-24 LAB — GC/CHLAMYDIA PROBE AMP (~~LOC~~) NOT AT ARMC
Chlamydia: NEGATIVE
Neisseria Gonorrhea: NEGATIVE

## 2015-02-24 LAB — HCG, QUANTITATIVE, PREGNANCY: hCG, Beta Chain, Quant, S: 385 m[IU]/mL — ABNORMAL HIGH (ref <5)

## 2015-02-24 NOTE — MAU Provider Note (Signed)
  History     CSN: [id number]  Arrival date and time: 02/24/15 1428   None     No chief complaint on file.  HPI Diana Torres is 24 y.o. G2P1001 [redacted]w[redacted]d weeks presenting for follow up BHCG.  Was seen 11/19 for lower abdominal pain with + UPT.  On that date U/S did not see IUGS, BHCG 224 and blood type is O Positive.  She is here with interpreter for the Deaf.  She denies pain and bleeding today and reports she feel "great".  Pregnancy is very wanted.     Past Medical History  Diagnosis Date  . Deaf   . UTI (lower urinary tract infection)     Past Surgical History  Procedure Laterality Date  . Tonsillectomy    . Cochlear implant    . Rhinoplasty    . Right foot  2016    cyst removed    No family history on file.  Social History  Substance Use Topics  . Smoking status: Never Smoker   . Smokeless tobacco: Never Used  . Alcohol Use: No    Allergies:  Allergies  Allergen Reactions  . Codeine Itching and Rash    Prescriptions prior to admission  Medication Sig Dispense Refill Last Dose  . docusate sodium (COLACE) 100 MG capsule Take 1 capsule (100 mg total) by mouth every 12 (twelve) hours. 60 capsule 0   . Prenatal Vit-Fe Fumarate-FA (PRENATAL MULTIVITAMIN) TABS tablet Take 1 tablet by mouth daily.   02/22/2015 at Unknown time  . promethazine (PHENERGAN) 25 MG tablet Take 1 tablet (25 mg total) by mouth every 6 (six) hours as needed for nausea or vomiting. 30 tablet 0     Review of Systems  Constitutional: Negative for fever and chills.  Gastrointestinal: Negative for nausea, vomiting and abdominal pain.  Genitourinary:       Negative for vaginal bleeding.  Neurological: Negative for headaches.   Physical Exam   Last menstrual period 01/18/2015.  Physical Exam  Constitutional: She is oriented to person, place, and time. She appears well-developed and well-nourished. No distress.  Genitourinary:  Deferred --patient is asymptomatic  Neurological: She is alert  and oriented to person, place, and time.  Psychiatric: She has a normal mood and affect. Her behavior is normal. Thought content normal.    Results for orders placed or performed during the hospital encounter of 02/24/15 (from the past 24 hour(s))  hCG, quantitative, pregnancy     Status: Abnormal   Collection Time: 02/24/15  3:06 PM  Result Value Ref Range   hCG, Beta Chain, Quant, S 385 (H) <5 mIU/mL   MAU Course  Procedures  MDM MSE Labs Discussed with Dr. Alver Sorrow given to repeat [facility] in clinic on Wednesday. Assessment and Plan  A:  Pregnancy of unknown location       BHCGs not reassuring       P:  Return for repeat BHCG in 48 hrs      strict ectopic precautions were given, patient understands      Interpreter to be here with patient on Wednesday  KEY,EVE M 02/24/2015, 3:38 PM

## 2015-02-24 NOTE — Discharge Instructions (Signed)
Ectopic Pregnancy An ectopic pregnancy happens when a fertilized egg grows outside the uterus. A pregnancy cannot live outside of the uterus. This problem often happens in the fallopian tube. It is often caused by damage to the fallopian tube. If this problem is found early, you may be treated with medicine. If your tube tears or bursts open (ruptures), you will bleed inside. This is an emergency. You will need surgery. Get help right away.  SYMPTOMS You may have normal pregnancy symptoms at first. These include:  Missing your period.  Feeling sick to your stomach (nauseous).  Being tired.  Having tender breasts. Then, you may start to have symptoms that are not normal. These include:  Pain with sex (intercourse).  Bleeding from the vagina. This includes light bleeding (spotting).  Belly (abdomen) or lower belly cramping or pain. This may be felt on one side.  A fast heartbeat (pulse).  Passing out (fainting) after going poop (bowel movement). If your tube tears, you may have symptoms such as:  Really bad pain in the belly or lower belly. This happens suddenly.  Dizziness.  Passing out.  Shoulder pain. GET HELP RIGHT AWAY IF:  You have any of these symptoms. This is an emergency. MAKE SURE YOU:  Understand these instructions.  Will watch your condition.  Will get help right away if you are not doing well or get worse.   This information is not intended to replace advice given to you by your health care provider. Make sure you discuss any questions you have with your health care provider.   Document Released: 06/18/2008 Document Revised: 03/27/2013 Document Reviewed: 11/01/2012 Elsevier Interactive Patient Education [employer].

## 2015-02-24 NOTE — MAU Note (Signed)
Doing ok, has an occasional cramp, nausea has been kind of bad- worse at night

## 2015-02-24 NOTE — MAU Note (Signed)
(  [phone]) Interpreter called about patient bring in MAU. Message left for them to return the call asap.  ([phone]) Intrepreting service called back saying they will call interpreters in the area and see who could come intrepret for her.  [phone]) Intrepreting service called back saying Romelle Starcher would be here in about 10 mins. Give or take for parking.

## 2015-02-26 ENCOUNTER — Other Ambulatory Visit (INDEPENDENT_AMBULATORY_CARE_PROVIDER_SITE_OTHER): Payer: Medicaid Other | Admitting: Family Medicine

## 2015-02-26 ENCOUNTER — Other Ambulatory Visit (HOSPITAL_COMMUNITY)
Admission: RE | Admit: 2015-02-26 | Discharge: 2015-02-26 | Disposition: A | Discharge status: Home / Self Care | Payer: Medicaid Other | Source: Ambulatory Visit | Attending: Family Medicine | Admitting: Family Medicine

## 2015-02-26 ENCOUNTER — Telehealth: Payer: Self-pay | Admitting: *Deleted

## 2015-02-26 DIAGNOSIS — Z331 Pregnant state, incidental: Secondary | ICD-10-CM | POA: Insufficient documentation

## 2015-02-26 DIAGNOSIS — O3680X0 Pregnancy with inconclusive fetal viability, not applicable or unspecified: Secondary | ICD-10-CM

## 2015-02-26 DIAGNOSIS — O0281 Inappropriate change in quantitative human chorionic gonadotropin (hCG) in early pregnancy: Secondary | ICD-10-CM

## 2015-02-26 LAB — HCG, QUANTITATIVE, PREGNANCY: HCG, BETA CHAIN, QUANT, S: 526 m[IU]/mL — AB (ref <5)

## 2015-02-26 NOTE — Progress Notes (Signed)
    Subjective:    Patient ID: Diana Torres is a 24 y.o. female presenting with No chief complaint on file.  on 02/26/2015  HPI: Here for f/u BHCG. Previously with appropriate rise in MAU. C/o lower abdominal cramping and nausea.  Some left sided pain. Pain is not constant.  Review of Systems  Constitutional: Negative for fever and chills.  Respiratory: Negative for shortness of breath.   Cardiovascular: Negative for chest pain.  Gastrointestinal: Negative for nausea, vomiting and abdominal pain.  Genitourinary: Negative for dysuria.  Skin: Negative for rash.      Objective:    LMP 01/18/2015 Physical Exam  Constitutional: She is oriented to person, place, and time. She appears well-developed and well-nourished. No distress.  HENT:  Head: Normocephalic and atraumatic.  Eyes: No scleral icterus.  Neck: Neck supple.  Cardiovascular: Normal rate.   Pulmonary/Chest: Effort normal.  Abdominal: Soft.  Genitourinary: Vagina normal. Uterus is not tender. Right adnexum displays tenderness (minimal). Right adnexum displays no fullness. Left adnexum displays no tenderness and no fullness.  Neurological: She is alert and oriented to person, place, and time.  Skin: Skin is warm and dry.  Psychiatric: She has a normal mood and affect.   Lab Results  Component Value Date   HCGBETAQNT 385* 02/24/2015   HCGBETAQNT 224* 02/22/2015       Assessment & Plan:  Pregnancy of Unknown Location - check BHCG today and stat--call results to patient.  Return if symptoms worsen or fail to improve.  Gurpreet Mikhail S 02/26/2015 10:56 AM

## 2015-02-26 NOTE — Patient Instructions (Signed)

## 2015-02-26 NOTE — Addendum Note (Signed)
Addended by: Langston Reusing on: 02/26/2015 12:24 PM   Modules accepted: Orders

## 2015-02-26 NOTE — Progress Notes (Signed)
Patient c/o pelvic pain =5 .

## 2015-02-26 NOTE — Telephone Encounter (Signed)
Per Dr. Kennon Rounds stat bhcg did not double, call patient and have her come to MAU Friday for blood draw. Called Rayette to notify her that her blood test results came back and did rise, but did not rise as much as we would like, so we would like her to come to MAU Friday 02/28/15 for another blood draw.  [place] voices understanding.

## 2015-03-01 ENCOUNTER — Inpatient Hospital Stay (HOSPITAL_COMMUNITY)
Admission: AD | Admit: 2015-03-01 | Discharge: 2015-03-01 | Disposition: A | Discharge status: Home / Self Care | Payer: Medicaid Other | Source: Ambulatory Visit | Attending: Obstetrics & Gynecology | Admitting: Obstetrics & Gynecology

## 2015-03-01 DIAGNOSIS — O26893 Other specified pregnancy related conditions, third trimester: Secondary | ICD-10-CM | POA: Diagnosis present

## 2015-03-01 DIAGNOSIS — O26899 Other specified pregnancy related conditions, unspecified trimester: Secondary | ICD-10-CM

## 2015-03-01 DIAGNOSIS — O26891 Other specified pregnancy related conditions, first trimester: Secondary | ICD-10-CM | POA: Insufficient documentation

## 2015-03-01 DIAGNOSIS — O9989 Other specified diseases and conditions complicating pregnancy, childbirth and the puerperium: Secondary | ICD-10-CM

## 2015-03-01 DIAGNOSIS — O0281 Inappropriate change in quantitative human chorionic gonadotropin (hCG) in early pregnancy: Secondary | ICD-10-CM | POA: Insufficient documentation

## 2015-03-01 DIAGNOSIS — Z3A01 Less than 8 weeks gestation of pregnancy: Secondary | ICD-10-CM | POA: Diagnosis not present

## 2015-03-01 DIAGNOSIS — R109 Unspecified abdominal pain: Secondary | ICD-10-CM

## 2015-03-01 LAB — HCG, QUANTITATIVE, PREGNANCY: hCG, Beta Chain, Quant, S: 823 m[IU]/mL — ABNORMAL HIGH (ref <5)

## 2015-03-01 NOTE — MAU Provider Note (Signed)
History   Chief Complaint:  Labs Only   Diana Torres is  24 y.o. G2P1001 Patient's last menstrual period was 01/18/2015.Marland Kitchen Patient is here for follow up of quantitative HCG and ongoing surveillance of pregnancy status.   She is [redacted]w[redacted]d weeks gestation  by LMP.    Since her last visit, the patient is without new complaint.   The patient reports bleeding as  none now.    General ROS:  negative  Her previous Quantitative HCG values are: 02/22/15 = 224 02/24/15 = 385 02/26/15 = 526 TODAY  =  823 (just under 66% rise)  Patient is deaf. Declines interpretor. Prefers to use writing.  Physical Exam   Blood pressure 121/83, pulse 97, temperature 97.7 F (36.5 C), resp. rate 18, last menstrual period 01/18/2015.  Alert and oriented In no apparent distress Skin warm and dry. Focused Gynecological Exam: exam declined by the patient  Labs: Results for orders placed or performed during the hospital encounter of 03/01/15 (from the past 24 hour(s))  hCG, quantitative, pregnancy   Collection Time: 03/01/15 10:21 AM  Result Value Ref Range   hCG, Beta Chain, Quant, S 823 (H) <5 mIU/mL    Ultrasound Studies:   US Ob Comp Less 14 Wks  02/22/2015  CLINICAL DATA:  Pain. Cramping since 11/12. Beta HCG of 224. Five weeks 0 days by last menstrual. EXAM: OBSTETRIC <14 WK [place] AND TRANSVAGINAL OB US TECHNIQUE: Both transabdominal and transvaginal ultrasound examinations were performed for complete evaluation of the gestation as well as the maternal uterus, adnexal regions, and pelvic cul-de-sac. Transvaginal technique was performed to assess early pregnancy. COMPARISON:  Pelvic ultrasound 09/25/2013 FINDINGS: Intrauterine gestational sac: Not visualized. Mildly heterogeneous endometrial thickening is nonspecific. Yolk sac:  Absent Embryo:  Absent Maternal uterus/adnexae: Both ovaries are normal in appearance. No adnexal mass. No significant free fluid. IMPRESSION: Absence of intrauterine gestational sac.  Given low beta HCG level and early gestational age, this is most likely indicative of an early intrauterine pregnancy. However, differential considerations include missed abortion or otherwise occult ectopic pregnancy. Beta HCG and possibly ultrasound follow-up suggested. Electronically Signed   By: Abigail Miyamoto M.D.   On: 02/22/2015 18:46   US Ob Transvaginal  02/22/2015  CLINICAL DATA:  Pain. Cramping since 11/12. Beta HCG of 224. Five weeks 0 days by last menstrual. EXAM: OBSTETRIC <14 WK [place] AND TRANSVAGINAL OB US TECHNIQUE: Both transabdominal and transvaginal ultrasound examinations were performed for complete evaluation of the gestation as well as the maternal uterus, adnexal regions, and pelvic cul-de-sac. Transvaginal technique was performed to assess early pregnancy. COMPARISON:  Pelvic ultrasound 09/25/2013 FINDINGS: Intrauterine gestational sac: Not visualized. Mildly heterogeneous endometrial thickening is nonspecific. Yolk sac:  Absent Embryo:  Absent Maternal uterus/adnexae: Both ovaries are normal in appearance. No adnexal mass. No significant free fluid. IMPRESSION: Absence of intrauterine gestational sac. Given low beta HCG level and early gestational age, this is most likely indicative of an early intrauterine pregnancy. However, differential considerations include missed abortion or otherwise occult ectopic pregnancy. Beta HCG and possibly ultrasound follow-up suggested. Electronically Signed   By: Abigail Miyamoto M.D.   On: 02/22/2015 18:46    Assessment:  [redacted]w[redacted]d weeks gestation   Pregnancy of unknown location Abdominal pain in early pregnancy, cannot rule out ectopic pregnancy yet Abnormal rise in [place]: The patient is instructed to follow up in in the next few days with ultrasound around Dec 1st She will go to Clinic for results Message sent  to [place] and to the clinic.  [facility] 03/01/2015, 1:44 PM

## 2015-03-01 NOTE — MAU Note (Signed)
Pt presents to MAU for repeat BHCG. Denies any vaginal bleeding or Pain

## 2015-03-01 NOTE — Discharge Instructions (Signed)

## 2015-03-07 ENCOUNTER — Inpatient Hospital Stay (HOSPITAL_COMMUNITY)
Admission: AD | Admit: 2015-03-07 | Discharge: 2015-03-07 | Disposition: A | Discharge status: Home / Self Care | Payer: Medicaid Other | Source: Ambulatory Visit | Attending: Obstetrics & Gynecology | Admitting: Obstetrics & Gynecology

## 2015-03-07 ENCOUNTER — Ambulatory Visit (HOSPITAL_COMMUNITY)
Admission: RE | Admit: 2015-03-07 | Discharge: 2015-03-07 | Disposition: A | Discharge status: Home / Self Care | Payer: Medicaid Other | Source: Ambulatory Visit | Attending: Advanced Practice Midwife | Admitting: Advanced Practice Midwife

## 2015-03-07 DIAGNOSIS — R109 Unspecified abdominal pain: Secondary | ICD-10-CM

## 2015-03-07 DIAGNOSIS — R102 Pelvic and perineal pain: Secondary | ICD-10-CM | POA: Insufficient documentation

## 2015-03-07 DIAGNOSIS — Z3A01 Less than 8 weeks gestation of pregnancy: Secondary | ICD-10-CM | POA: Insufficient documentation

## 2015-03-07 DIAGNOSIS — O26891 Other specified pregnancy related conditions, first trimester: Secondary | ICD-10-CM | POA: Diagnosis present

## 2015-03-07 DIAGNOSIS — O020 Blighted ovum and nonhydatidiform mole: Secondary | ICD-10-CM | POA: Diagnosis not present

## 2015-03-07 DIAGNOSIS — O0281 Inappropriate change in quantitative human chorionic gonadotropin (hCG) in early pregnancy: Secondary | ICD-10-CM | POA: Diagnosis present

## 2015-03-07 DIAGNOSIS — O26899 Other specified pregnancy related conditions, unspecified trimester: Secondary | ICD-10-CM

## 2015-03-07 LAB — HCG, QUANTITATIVE, PREGNANCY: hCG, Beta Chain, Quant, S: 2104 m[IU]/mL — ABNORMAL HIGH (ref <5)

## 2015-03-07 NOTE — Discharge Instructions (Signed)

## 2015-03-07 NOTE — MAU Provider Note (Signed)
History   [id number]   Chief Complaint  Patient presents with  . Follow-up    HPI Diana Torres is a 24 y.o. female  G2P1001 here with for follow-up ultrasound.  Upon review of the records patient was first seen on 11/19 for abdominal pain. BHCG on that day was 224. Ultrasound showed no IUP.  GC/CT and wet prep were collected. Results were negative. Pt discharged home & continued to return for f/u BHCG. Pt here today for f/u ultrasound with no report of abdominal pain or vaginal bleeding. All other systems wnl. Last seen in MAU on 11/26. BHCG was 823.     Patient's last menstrual period was 01/18/2015.  OB History  Gravida Para Term Preterm AB SAB TAB Ectopic Multiple Living  2 1 1       1     # Outcome Date GA Lbr Len/2nd Weight Sex Delivery Anes PTL Lv  2 Current           1 Term 09/23/12 [redacted]w[redacted]d 15:04 / 02:15 7 lb 6 oz (3.345 kg) M Vag-Spont EPI  Y      Past Medical History  Diagnosis Date  . Deaf   . UTI (lower urinary tract infection)     No family history on file.  Social History   Social History  . Marital Status: Single    Spouse Name: N/A  . Number of Children: N/A  . Years of Education: N/A   Social History Main Topics  . Smoking status: Never Smoker   . Smokeless tobacco: Never Used  . Alcohol Use: No  . Drug Use: No  . Sexual Activity: Not Currently   Other Topics Concern  . Not on file   Social History Narrative    Allergies  Allergen Reactions  . Codeine Itching and Rash    No current facility-administered medications on file prior to encounter.   Current Outpatient Prescriptions on File Prior to Encounter  Medication Sig Dispense Refill  . docusate sodium (COLACE) 100 MG capsule Take 1 capsule (100 mg total) by mouth every 12 (twelve) hours. 60 capsule 0  . Prenatal Vit-Fe Fumarate-FA (PRENATAL MULTIVITAMIN) TABS tablet Take 1 tablet by mouth daily.    . promethazine (PHENERGAN) 25 MG tablet Take 1 tablet (25 mg total) by mouth every 6  (six) hours as needed for nausea or vomiting. 30 tablet 0     Physical Exam   Filed Vitals:   03/07/15 1455  BP: 148/104  Pulse: 123  Temp: 98.4 F (36.9 C)  TempSrc: Oral  Resp: 18    Physical Exam  Constitutional: She appears well-developed and well-nourished. No distress.  Respiratory: Effort normal. No respiratory distress.  Musculoskeletal: Normal range of motion.  Skin: She is not diaphoretic.  Psychiatric: She has a normal mood and affect. Her behavior is normal. Judgment and thought content normal.    MAU Course  Procedures Component     Latest Ref Rng 02/22/2015 02/24/2015 02/26/2015 03/01/2015  HCG, Beta Chain, Quant, S     <5 mIU/mL 224 (H) 385 (H) 526 (H) 823 (H)   Component     Latest Ref Rng 03/07/2015  HCG, Beta Chain, Quant, S     <5 mIU/mL 2104 (H)   US Ob Transvaginal  03/07/2015  CLINICAL DATA:  Pregnancy of unknown anatomic location. Inappropriate rise in beta HCG levels. Pelvic pain and cramping. EXAM: TRANSVAGINAL OB ULTRASOUND TECHNIQUE: Transvaginal ultrasound was performed for complete evaluation of the gestation as well as the maternal  uterus, adnexal regions, and pelvic cul-de-sac. COMPARISON:  02/22/2015 FINDINGS: Intrauterine gestational sac: Visualized/normal in shape. Yolk sac:  Not visualized Embryo:  Not visualized MSD: 6  mm   5 w   2  d Maternal uterus/adnexae: Both of the are normal in appearance. No mass or free fluid identified. IMPRESSION: Single intrauterine gestational sac with estimated gestational age of [redacted] weeks 2 days by mean sac diameter. Recommend followup of quantitative beta HCG levels, and consider followup ultrasound to assess viability in 14 days. Electronically Signed   By: Earle Gell M.D.   On: 03/07/2015 14:16    MDM Inappropriate rise in Bhcg IUGS on ultrasound without yolk sac C/w Dr. Roselie Awkward. Patient to return in 2 weeks for outpatient ultrasound  Assessment and Plan  24 y.o. G2P1001 at [redacted]w[redacted]d IUP  1. Blighted ovum     P: Discharge home Discussed reasons to return to MAU Outpatient ultrasound in 2 weeks - results in clinic  Jorje Guild, NP 03/07/2015 3:17 PM

## 2015-03-07 NOTE — MAU Note (Signed)
Doing ok.

## 2015-03-10 ENCOUNTER — Telehealth: Payer: Self-pay | Admitting: General Practice

## 2015-03-10 NOTE — Telephone Encounter (Signed)
Patient called in to front office stating she thinks she had a miscarriage over the weekend and wants to know if she needs to be seen. Patient states she had heavy dark red bleeding like a period & cramping. Asked patient if she could come in today. Patient states she has an exam today but will call if she can come in this afternoon at 3pm. Gave patient appt for 12/7 @ 210. Patient verbalized understanding & will call if she can come in today. Patient states she has been having pretty bad cramps and states she has taken tylenol and two motrin yesterday but it didn't help. Recommended she take 4 ibuprofen now and she can take more 6 hours from now as well. Patient verbalized understanding & had no other questions. Talked with patient through sign language relay interpreting.

## 2015-03-11 ENCOUNTER — Encounter (HOSPITAL_COMMUNITY): Payer: Self-pay | Admitting: *Deleted

## 2015-03-11 ENCOUNTER — Inpatient Hospital Stay (HOSPITAL_COMMUNITY)
Admission: AD | Admit: 2015-03-11 | Discharge: 2015-03-11 | Disposition: A | Discharge status: Home / Self Care | Payer: Medicaid Other | Source: Ambulatory Visit | Attending: Family Medicine | Admitting: Family Medicine

## 2015-03-11 ENCOUNTER — Inpatient Hospital Stay (HOSPITAL_COMMUNITY): Payer: Medicaid Other

## 2015-03-11 DIAGNOSIS — N719 Inflammatory disease of uterus, unspecified: Secondary | ICD-10-CM | POA: Diagnosis not present

## 2015-03-11 DIAGNOSIS — O209 Hemorrhage in early pregnancy, unspecified: Secondary | ICD-10-CM

## 2015-03-11 DIAGNOSIS — R109 Unspecified abdominal pain: Secondary | ICD-10-CM

## 2015-03-11 DIAGNOSIS — O23591 Infection of other part of genital tract in pregnancy, first trimester: Secondary | ICD-10-CM | POA: Diagnosis not present

## 2015-03-11 DIAGNOSIS — O035 Genital tract and pelvic infection following complete or unspecified spontaneous abortion: Secondary | ICD-10-CM

## 2015-03-11 DIAGNOSIS — O26899 Other specified pregnancy related conditions, unspecified trimester: Secondary | ICD-10-CM

## 2015-03-11 DIAGNOSIS — Z3A01 Less than 8 weeks gestation of pregnancy: Secondary | ICD-10-CM | POA: Diagnosis not present

## 2015-03-11 DIAGNOSIS — O039 Complete or unspecified spontaneous abortion without complication: Secondary | ICD-10-CM | POA: Diagnosis present

## 2015-03-11 LAB — CBC
HCT: 36 % (ref 36.0–46.0)
Hemoglobin: 12 g/dL (ref 12.0–15.0)
MCH: 29.3 pg (ref 26.0–34.0)
MCHC: 33.3 g/dL (ref 30.0–36.0)
MCV: 87.8 fL (ref 78.0–100.0)
PLATELETS: 274 10*3/uL (ref 150–400)
RBC: 4.1 MIL/uL (ref 3.87–5.11)
RDW: 13 % (ref 11.5–15.5)
WBC: 8.5 10*3/uL (ref 4.0–10.5)

## 2015-03-11 LAB — DIFFERENTIAL
BASOS ABS: 0 10*3/uL (ref 0.0–0.1)
BASOS PCT: 0 %
EOS ABS: 0.4 10*3/uL (ref 0.0–0.7)
Eosinophils Relative: 4 %
Lymphocytes Relative: 25 %
Lymphs Abs: 2.1 10*3/uL (ref 0.7–4.0)
MONOS PCT: 8 %
Monocytes Absolute: 0.7 10*3/uL (ref 0.1–1.0)
Neutro Abs: 5.1 10*3/uL (ref 1.7–7.7)
Neutrophils Relative %: 63 %

## 2015-03-11 LAB — TYPE AND SCREEN
ABO/RH(D): O POS
Antibody Screen: NEGATIVE

## 2015-03-11 LAB — URINALYSIS, ROUTINE W REFLEX MICROSCOPIC
BILIRUBIN URINE: NEGATIVE
Glucose, UA: NEGATIVE mg/dL
KETONES UR: NEGATIVE mg/dL
NITRITE: NEGATIVE
PH: 6.5 (ref 5.0–8.0)
PROTEIN: NEGATIVE mg/dL
Specific Gravity, Urine: 1.015 (ref 1.005–1.030)

## 2015-03-11 LAB — HCG, QUANTITATIVE, PREGNANCY: hCG, Beta Chain, Quant, S: 222 m[IU]/mL — ABNORMAL HIGH (ref <5)

## 2015-03-11 LAB — URINE MICROSCOPIC-ADD ON

## 2015-03-11 MED ORDER — KETOROLAC TROMETHAMINE 10 MG PO TABS
10.0000 mg | ORAL_TABLET | Freq: Once | ORAL | Status: DC
  Filled 2015-03-11: qty 1

## 2015-03-11 MED ORDER — DOXYCYCLINE HYCLATE 100 MG IV SOLR
100.0000 mg | Freq: Once | INTRAVENOUS | Status: AC
  Administered 2015-03-11: 100 mg via INTRAVENOUS
  Filled 2015-03-11: qty 100

## 2015-03-11 MED ORDER — DOXYCYCLINE MONOHYDRATE 25 MG/5ML PO SUSR: 100.0000 mg | Freq: Two times a day (BID) | ORAL | Status: DC

## 2015-03-11 MED ORDER — OXYCODONE-ACETAMINOPHEN 5-325 MG PO TABS
2.0000 | ORAL_TABLET | Freq: Once | ORAL | Status: AC
  Administered 2015-03-11: 2 via ORAL
  Filled 2015-03-11: qty 2

## 2015-03-11 MED ORDER — KETOROLAC TROMETHAMINE 30 MG/ML IJ SOLN
30.0000 mg | Freq: Once | INTRAMUSCULAR | Status: AC
  Administered 2015-03-11: 30 mg via INTRAVENOUS
  Filled 2015-03-11: qty 1

## 2015-03-11 MED ORDER — OXYCODONE-ACETAMINOPHEN 5-325 MG/5ML PO SOLN: 5.0000 mL | ORAL | Status: DC | PRN

## 2015-03-11 MED ORDER — IBUPROFEN 100 MG/5ML PO SUSP: 200.0000 mg | ORAL | Status: DC | PRN

## 2015-03-11 MED ORDER — SODIUM CHLORIDE 0.9 % IV BOLUS (SEPSIS): 1000.0000 mL | Freq: Once | INTRAVENOUS | Status: DC

## 2015-03-11 MED ORDER — DOXYCYCLINE HYCLATE 100 MG PO TABS: 100.0000 mg | ORAL_TABLET | Freq: Once | ORAL | Status: DC

## 2015-03-11 NOTE — Progress Notes (Signed)
I spoke with pt and her family along with a sign language interpreter, Elwyn Reach.  I offered bereavement support, education and resources, including [employer] for their 23 year old son, Diana Torres.   I offered prayer at the family's request as well.  Family was very receptive to spiritual and emotional support and became tearful during the prayer.      Chaplain Janne Napoleon, Bcc Pager, [phone] 4:03 PM    03/11/15 1600  Clinical Encounter Type  Visited With Patient;Patient and family together  Visit Type Spiritual support  Referral From Nurse  Spiritual Encounters  Spiritual Needs Grief support;Prayer  Stress Factors  Patient Stress Factors Loss (Perinatal loss)

## 2015-03-11 NOTE — MAU Provider Note (Signed)
History   Chief Complaint:  No chief complaint on file.   Diana Torres is  24 y.o. G2P1001 Patient's last menstrual period was 01/18/2015.Marland Kitchen Patient is here for follow up of quantitative HCG and ongoing surveillance of pregnancy status.  She is [redacted]w[redacted]d weeks gestation  by LMP with suspected and embryonic pregnancy.    Since her last visit, the patient is with new complaint.  Had heavier bleeding, passage of tissue and cramping over the weekend. Now has chills and severe abdominal pain.  ROS Positive for chills. Negative for fever, nausea, vomiting, diarrhea, constipation, urinary complaints. Abdomin Pain: Severe low abdominal cramping Vaginal bleeding: lighter than period.   Passage of clots or tissue: Positive for passage of tissue and clots Dizziness: None  Her previous Quantitative HCG values are:  Results for Diana Torres (MRN [id number]) as of 03/11/2015 10:46  Ref. Range 02/24/2015 15:06 02/26/2015 10:45 03/01/2015 10:21 03/07/2015 14:58  HCG, Beta Chain, Quant, S Latest Ref Range: <5 mIU/mL 385 (H) 526 (H) 823 (H) 2104 (H)    Physical Exam   BP 107/65 mmHg  Pulse 96  Temp(Src) 98 F (36.7 C)  Resp 18  LMP 01/18/2015 Constitutional: Well-nourished female in severe distress. Tearful. No pallor. Neuro: Alert and oriented 4 Cardiovascular: Normal rate Respiratory: Normal effort and rate Abdomen: Soft, moderate generalized tenderness, severe suprapubic tenderness. Positive bowel sounds 4. Gynecological Exam: VULVA: normal appearing vulva with no masses, tenderness or lesions, VAGINA: normal appearing vagina with normal color and rugae CERVIX: normal appearing cervix without discharge or lesions, small amount of dark red bleeding from os. UTERUS: uterus is top-normal size, extremely tender ADNEXA: normal adnexa in size, no masses. Mild bilateral tenderness  Labs: Results for orders placed or performed during the hospital encounter of 03/11/15 (from the past 24 hour(s))  hCG,  quantitative, pregnancy   Collection Time: 03/11/15 11:40 AM  Result Value Ref Range   hCG, Beta Chain, Quant, S 222 (H) <5 mIU/mL  CBC   Collection Time: 03/11/15 11:41 AM  Result Value Ref Range   WBC 8.5 4.0 - 10.5 K/uL   RBC 4.10 3.87 - 5.11 MIL/uL   Hemoglobin 12.0 12.0 - 15.0 g/dL   HCT 36.0 36.0 - 46.0 %   MCV 87.8 78.0 - 100.0 fL   MCH 29.3 26.0 - 34.0 pg   MCHC 33.3 30.0 - 36.0 g/dL   RDW 13.0 11.5 - 15.5 %   Platelets 274 150 - 400 K/uL  Urinalysis, Routine w reflex microscopic (not at [facility])   Collection Time: 03/11/15 11:47 AM  Result Value Ref Range   Color, Urine YELLOW YELLOW   APPearance CLEAR CLEAR   Specific Gravity, Urine 1.015 1.005 - 1.030   pH 6.5 5.0 - 8.0   Glucose, UA NEGATIVE NEGATIVE mg/dL   Hgb urine dipstick LARGE (A) NEGATIVE   Bilirubin Urine NEGATIVE NEGATIVE   Ketones, ur NEGATIVE NEGATIVE mg/dL   Protein, ur NEGATIVE NEGATIVE mg/dL   Nitrite NEGATIVE NEGATIVE   Leukocytes, UA TRACE (A) NEGATIVE  Urine microscopic-add on   Collection Time: 03/11/15 11:47 AM  Result Value Ref Range   Squamous Epithelial / LPF 0-5 (A) NONE SEEN   WBC, UA 0-5 0 - 5 WBC/hpf   RBC / HPF 6-30 0 - 5 RBC/hpf   Bacteria, UA RARE (A) NONE SEEN  Differential   Collection Time: 03/11/15 11:49 AM  Result Value Ref Range   Neutrophils Relative % 63 %   Neutro Abs 5.1 1.7 - 7.7 K/uL  Lymphocytes Relative 25 %   Lymphs Abs 2.1 0.7 - 4.0 K/uL   Monocytes Relative 8 %   Monocytes Absolute 0.7 0.1 - 1.0 K/uL   Eosinophils Relative 4 %   Eosinophils Absolute 0.4 0.0 - 0.7 K/uL   Basophils Relative 0 %   Basophils Absolute 0.0 0.0 - 0.1 K/uL  Type and screen [name]   Collection Time: 03/11/15 11:49 AM  Result Value Ref Range   ABO/RH(D) O POS    Antibody Screen NEG    Sample Expiration 03/14/2015     Ultrasound Studies:   US Ob Comp Less 14 Wks  02/22/2015  CLINICAL DATA:  Pain. Cramping since 11/12. Beta HCG of 224. Five weeks 0 days by  last menstrual. EXAM: OBSTETRIC <14 WK [place] AND TRANSVAGINAL OB US TECHNIQUE: Both transabdominal and transvaginal ultrasound examinations were performed for complete evaluation of the gestation as well as the maternal uterus, adnexal regions, and pelvic cul-de-sac. Transvaginal technique was performed to assess early pregnancy. COMPARISON:  Pelvic ultrasound 09/25/2013 FINDINGS: Intrauterine gestational sac: Not visualized. Mildly heterogeneous endometrial thickening is nonspecific. Yolk sac:  Absent Embryo:  Absent Maternal uterus/adnexae: Both ovaries are normal in appearance. No adnexal mass. No significant free fluid. IMPRESSION: Absence of intrauterine gestational sac. Given low beta HCG level and early gestational age, this is most likely indicative of an early intrauterine pregnancy. However, differential considerations include missed abortion or otherwise occult ectopic pregnancy. Beta HCG and possibly ultrasound follow-up suggested. Electronically Signed   By: Abigail Miyamoto M.D.   On: 02/22/2015 18:46   US Ob Transvaginal  03/11/2015  CLINICAL DATA:  Cramping.  Vaginal bleeding. EXAM: TRANSVAGINAL OB ULTRASOUND TECHNIQUE: Transvaginal ultrasound was performed for complete evaluation of the gestation as well as the maternal uterus, adnexal regions, and pelvic cul-de-sac. COMPARISON:  03/07/2015 FINDINGS: Intrauterine gestational sac: None Yolk sac:  None Embryo:  None Cardiac Activity: Not applicable Heart Rate: Not applicable bpm Maternal uterus/adnexae: Subchorionic hemorrhage: None Right ovary: Normal Left ovary: Normal Other :None Free fluid:  None IMPRESSION: 1. No intrauterine gestational sac, yolk sac, or fetal pole identified. Findings are worrisome for missed abortion. Followup ultrasound is recommended in 10-14 days for further evaluation. Electronically Signed   By: Kerby Moors M.D.   On: 03/11/2015 12:43   US Ob Transvaginal  03/07/2015  CLINICAL DATA:  Pregnancy of unknown anatomic  location. Inappropriate rise in beta HCG levels. Pelvic pain and cramping. EXAM: TRANSVAGINAL OB ULTRASOUND TECHNIQUE: Transvaginal ultrasound was performed for complete evaluation of the gestation as well as the maternal uterus, adnexal regions, and pelvic cul-de-sac. COMPARISON:  02/22/2015 FINDINGS: Intrauterine gestational sac: Visualized/normal in shape. Yolk sac:  Not visualized Embryo:  Not visualized MSD: 6  mm   5 w   2  d Maternal uterus/adnexae: Both of the are normal in appearance. No mass or free fluid identified. IMPRESSION: Single intrauterine gestational sac with estimated gestational age of [redacted] weeks 2 days by mean sac diameter. Recommend followup of quantitative beta HCG levels, and consider followup ultrasound to assess viability in 14 days. Electronically Signed   By: Earle Gell M.D.   On: 03/07/2015 14:16   US Ob Transvaginal  02/22/2015  CLINICAL DATA:  Pain. Cramping since 11/12. Beta HCG of 224. Five weeks 0 days by last menstrual. EXAM: OBSTETRIC <14 WK [place] AND TRANSVAGINAL OB US TECHNIQUE: Both transabdominal and transvaginal ultrasound examinations were performed for complete evaluation of the gestation as well as the maternal uterus, adnexal  regions, and pelvic cul-de-sac. Transvaginal technique was performed to assess early pregnancy. COMPARISON:  Pelvic ultrasound 09/25/2013 FINDINGS: Intrauterine gestational sac: Not visualized. Mildly heterogeneous endometrial thickening is nonspecific. Yolk sac:  Absent Embryo:  Absent Maternal uterus/adnexae: Both ovaries are normal in appearance. No adnexal mass. No significant free fluid. IMPRESSION: Absence of intrauterine gestational sac. Given low beta HCG level and early gestational age, this is most likely indicative of an early intrauterine pregnancy. However, differential considerations include missed abortion or otherwise occult ectopic pregnancy. Beta HCG and possibly ultrasound follow-up suggested. Electronically Signed   By: Abigail Miyamoto M.D.   On: 02/22/2015 18:46    MAU course/MDM: Quantitative hCG, ABO/Rh, CBC with differential, UA, ultrasound, Percocet, nothing by mouth. Discussed patient's history, exam, labs, ultrasound with Dr. Kennon Rounds. Even though the patient is without fever or elevated white blood cell count exam is still consistent with endometriosis. Will treat with doxycycline. Dose given in MAU. Chaplain and see patient.  Pain and bleeding in early pregnancy with drop in Quant, but hemodynamically stable.  Assessment: Completed AB 1. Spontaneous abortion with endometritis   2. Abdominal pain complicating pregnancy, antepartum   3. Vaginal bleeding in pregnancy, first trimester    Plan: Discharge home in stable condition per consult with Dr. Kennon Rounds. Bleeding precautions Return to maternity admissions for fever greater than 100.4, worsening pain, severe bleeding. Support given.     Follow-up Information    Follow up with [facility] On 03/17/2015.   Specialty:  Obstetrics and Gynecology   Why:  at 1:00 for miscarriage follow-up appointment or sooner as needed if symptoms worsen   Contact information:   [place] [phone]      Follow up with [place].   Why:  As needed if symptoms worsen (Fever greater than 100.4, worsening pain, severe bleeding)   Contact information:   [address] [id number] [place] [phone]       Medication List    TAKE these medications        acetaminophen 500 MG tablet  Commonly known as:  TYLENOL  Take 1,000 mg by mouth every 6 (six) hours as needed for mild pain.     docusate sodium 100 MG capsule  Commonly known as:  COLACE  Take 1 capsule (100 mg total) by mouth every 12 (twelve) hours.     doxycycline 25 MG/5ML Susr  Commonly known as:  VIBRAMYCIN  Take 20 mLs (100 mg total) by mouth 2 (two) times daily.      ibuprofen 100 MG/5ML suspension  Commonly known as:  ADVIL,MOTRIN  Take 10 mLs (200 mg total) by mouth every 4 (four) hours as needed.     oxyCODONE-acetaminophen 5-325 MG/5ML solution  Commonly known as:  ROXICET  Take 5-10 mLs by mouth every 4 (four) hours as needed for severe pain.     prenatal multivitamin Tabs tablet  Take 1 tablet by mouth daily.     promethazine 25 MG tablet  Commonly known as:  PHENERGAN  Take 1 tablet (25 mg total) by mouth every 6 (six) hours as needed for nausea or vomiting.       [name], [place] 03/11/2015, 10:13 PM  2/3

## 2015-03-11 NOTE — MAU Note (Signed)
Message left for deaf interpreter to call MAU.

## 2015-03-11 NOTE — MAU Note (Signed)
Pt presents to MAU for complaints of increase in abdominal pain, Vaginal bleeding light pink

## 2015-03-11 NOTE — Discharge Instructions (Signed)
Endometritis Endometritis is an irritation, soreness, and swelling (inflammation) of the lining of the uterus (endometrium).  CAUSES   Bacterial infections.  Sexually transmitted infections (STIs).  Having a miscarriage or childbirth, especially after a long labor or cesarean delivery.  Certain gynecological procedures (such as dilation and curettage, hysteroscopy, or contraceptive insertion). SIGNS AND SYMPTOMS   Fever.  Lower abdominal or pelvic pain.  Abnormal vaginal discharge or bleeding.  Abdominal bloating (distention) or swelling.  General discomfort or ill feeling.  Discomfort with bowel movements. DIAGNOSIS  A physical and pelvic exam are performed. Other tests may include:  Cultures from the cervix.  Blood tests.  Examining a tissue sample of the uterine lining (endometrial biopsy).  Examining discharge under a microscope (wet prep).  Laparoscopy. TREATMENT  Antibiotic medicines are usually given. Other treatments may include:  Fluids through an IV tube inserted in your vein.  Rest. HOME CARE INSTRUCTIONS   Take over-the-counter or prescription medicines for pain, discomfort, or fever as directed by your health care provider.  Take your antibiotics as directed. Finish them even if you start to feel better.  Resume your normal diet and activities as directed or as tolerated.  Do not douche or have sexual intercourse until your health care provider says it is okay.  Do not have sexual intercourse until your partner has been treated if your endometritis is caused by an STI. SEEK IMMEDIATE MEDICAL CARE IF:   You have swelling or increasing pain in the abdomen.  You have a fever.  You have bad smelling vaginal discharge, or you have an increased amount of discharge.  You have abnormal vaginal bleeding.  Your medicine is not helping with the pain.  You experience any problems that may be related to the medicine you are taking.  You have nausea  and vomiting, or you cannot keep foods down.  You have pain with bowel movements. MAKE SURE YOU:   Understand these instructions.  Will watch your condition.  Will get help right away if you are not doing well or get worse.   This information is not intended to replace advice given to you by your health care provider. Make sure you discuss any questions you have with your health care provider.   Document Released: 03/16/2001 Document Revised: 11/22/2012 Document Reviewed: 10/19/2012 Elsevier Interactive Patient Education 2016 [place] A miscarriage is the sudden loss of an unborn baby (fetus) before the 20th week of pregnancy. Most miscarriages happen in the first 3 months of pregnancy. Sometimes, it happens before a woman even knows she is pregnant. A miscarriage is also called a "spontaneous miscarriage" or "early pregnancy loss." Having a miscarriage can be an emotional experience. Talk with your caregiver about any questions you may have about miscarrying, the grieving process, and your future pregnancy plans. CAUSES   Problems with the fetal chromosomes that make it impossible for the baby to develop normally. Problems with the baby's genes or chromosomes are most often the result of errors that occur, by chance, as the embryo divides and grows. The problems are not inherited from the parents.  Infection of the cervix or uterus.   Hormone problems.   Problems with the cervix, such as having an incompetent cervix. This is when the tissue in the cervix is not strong enough to hold the pregnancy.   Problems with the uterus, such as an abnormally shaped uterus, uterine fibroids, or congenital abnormalities.   Certain medical conditions.   Smoking, drinking alcohol, or  taking illegal drugs.   Trauma.  Often, the cause of a miscarriage is unknown.  SYMPTOMS   Vaginal bleeding or spotting, with or without cramps or pain.  Pain or cramping in the abdomen  or lower back.  Passing fluid, tissue, or blood clots from the vagina. DIAGNOSIS  Your caregiver will perform a physical exam. You may also have an ultrasound to confirm the miscarriage. Blood or urine tests may also be ordered. TREATMENT   Sometimes, treatment is not necessary if you naturally pass all the fetal tissue that was in the uterus. If some of the fetus or placenta remains in the body (incomplete miscarriage), tissue left behind may become infected and must be removed. Usually, a dilation and curettage (D and C) procedure is performed. During a D and C procedure, the cervix is widened (dilated) and any remaining fetal or placental tissue is gently removed from the uterus.  Antibiotic medicines are prescribed if there is an infection. Other medicines may be given to reduce the size of the uterus (contract) if there is a lot of bleeding.  If you have Rh negative blood and your baby was Rh positive, you will need a Rh immunoglobulin shot. This shot will protect any future baby from having Rh blood problems in future pregnancies. HOME CARE INSTRUCTIONS   Your caregiver may order bed rest or may allow you to continue light activity. Resume activity as directed by your caregiver.  Have someone help with home and family responsibilities during this time.   Keep track of the number of sanitary pads you use each day and how soaked (saturated) they are. Write down this information.   Do not use tampons. Do not douche or have sexual intercourse until approved by your caregiver.   Only take over-the-counter or prescription medicines for pain or discomfort as directed by your caregiver.   Do not take aspirin. Aspirin can cause bleeding.   Keep all follow-up appointments with your caregiver.   If you or your partner have problems with grieving, talk to your caregiver or seek counseling to help cope with the pregnancy loss. Allow enough time to grieve before trying to get pregnant  again.  SEEK IMMEDIATE MEDICAL CARE IF:   You have severe cramps or pain in your back or abdomen.  You have a fever.  You pass large blood clots (walnut-sized or larger) ortissue from your vagina. Save any tissue for your caregiver to inspect.   Your bleeding increases.   You have a thick, bad-smelling vaginal discharge.  You become lightheaded, weak, or you faint.   You have chills.  MAKE SURE YOU:  Understand these instructions.  Will watch your condition.  Will get help right away if you are not doing well or get worse.   This information is not intended to replace advice given to you by your health care provider. Make sure you discuss any questions you have with your health care provider.   Document Released: 09/15/2000 Document Revised: 07/17/2012 Document Reviewed: 05/11/2011 Elsevier Interactive Patient Education [employer].

## 2015-03-11 NOTE — [facility] Note (Signed)
Sign language interpreter should be here within 30 minutes.

## 2015-03-12 ENCOUNTER — Ambulatory Visit: Payer: Medicaid Other | Admitting: Advanced Practice Midwife

## 2015-03-13 ENCOUNTER — Encounter: Payer: Self-pay | Admitting: *Deleted

## 2015-03-13 LAB — CULTURE, OB URINE: SPECIAL REQUESTS: NORMAL

## 2015-03-17 ENCOUNTER — Ambulatory Visit (INDEPENDENT_AMBULATORY_CARE_PROVIDER_SITE_OTHER): Payer: Medicaid Other | Admitting: Obstetrics & Gynecology

## 2015-03-17 ENCOUNTER — Encounter: Payer: Self-pay | Admitting: Obstetrics & Gynecology

## 2015-03-17 VITALS — BP 115/70 | HR 64 | Temp 98.6°F | Wt 156.6 lb

## 2015-03-17 DIAGNOSIS — O039 Complete or unspecified spontaneous abortion without complication: Secondary | ICD-10-CM | POA: Diagnosis not present

## 2015-03-17 NOTE — Progress Notes (Signed)
Patient ID: Diana Torres, female   DOB: 1990/05/27, 24 y.o.   MRN: [id number]  Chief Complaint  Patient presents with  . Miscarriage  [facility] f/u  HPI Diana Torres is a 24 y.o. female.  [id number] Patient's last menstrual period was 01/18/2015.  S/P early SAb and had gestational sac which was no longer present on f/u US 12/6, HCG dropping HPI  Past Medical History  Diagnosis Date  . Deaf   . UTI (lower urinary tract infection)     Past Surgical History  Procedure Laterality Date  . Tonsillectomy    . Cochlear implant    . Rhinoplasty    . Right foot  2016    cyst removed    No family history on file.  Social History Social History  Substance Use Topics  . Smoking status: Never Smoker   . Smokeless tobacco: Never Used  . Alcohol Use: No    Allergies  Allergen Reactions  . Codeine Itching and Rash    Current Outpatient Prescriptions  Medication Sig Dispense Refill  . doxycycline (VIBRAMYCIN) 25 MG/5ML SUSR Take 20 mLs (100 mg total) by mouth 2 (two) times daily. 300 mL 0  . Prenatal Vit-Fe Fumarate-FA (PRENATAL MULTIVITAMIN) TABS tablet Take 1 tablet by mouth daily.    Marland Kitchen acetaminophen (TYLENOL) 500 MG tablet Take 1,000 mg by mouth every 6 (six) hours as needed for mild pain.    Marland Kitchen docusate sodium (COLACE) 100 MG capsule Take 1 capsule (100 mg total) by mouth every 12 (twelve) hours. (Patient not taking: Reported on 03/17/2015) 60 capsule 0  . ibuprofen (ADVIL,MOTRIN) 100 MG/5ML suspension Take 10 mLs (200 mg total) by mouth every 4 (four) hours as needed. (Patient not taking: Reported on 03/17/2015) 150 mL 1  . oxyCODONE-acetaminophen (ROXICET) 5-325 MG/5ML solution Take 5-10 mLs by mouth every 4 (four) hours as needed for severe pain. (Patient not taking: Reported on 03/17/2015) 150 mL 0  . promethazine (PHENERGAN) 25 MG tablet Take 1 tablet (25 mg total) by mouth every 6 (six) hours as needed for nausea or vomiting. (Patient not taking: Reported on 03/17/2015) 30 tablet 0    No current facility-administered medications for this visit.    Review of Systems Review of Systems  Constitutional: Negative.   Gastrointestinal: Negative.   Genitourinary: Negative for vaginal bleeding, vaginal discharge and pelvic pain.    Blood pressure 115/70, pulse 64, temperature 98.6 F (37 C), weight 156 lb 9.6 oz (71.033 kg), last menstrual period 01/18/2015.  Physical Exam Physical Exam  Constitutional: She is oriented to person, place, and time. She appears well-developed. No distress.  Pulmonary/Chest: Effort normal. No respiratory distress.  Neurological: She is alert and oriented to person, place, and time.  Psychiatric: She has a normal mood and affect. Her behavior is normal.    Data Reviewed  Left ovary: Normal  Other :None  Free fluid: None  IMPRESSION: 1. No intrauterine gestational sac, yolk sac, or fetal pole identified. Findings are worrisome for missed abortion. Followup ultrasound is recommended in 10-14 days for further evaluation.   Electronically Signed  By: Kerby Moors M.D.  On: 03/11/2015 12:43 HCG 222 on 12/6  Assessment    S/P complete SAb, requests f/u HCG     Plan    Plans to try to conceive, no BCM prescribed Report positive pregnancy test          Diana Torres 03/17/2015, 1:47 PM

## 2015-03-17 NOTE — Patient Instructions (Signed)
Miscarriage  A miscarriage is the sudden loss of an unborn baby (fetus) before the 20th week of pregnancy. Most miscarriages happen in the first 3 months of pregnancy. Sometimes, it happens before a woman even knows she is pregnant. A miscarriage is also called a "spontaneous miscarriage" or "early pregnancy loss." Having a miscarriage can be an emotional experience. Talk with your caregiver about any questions you may have about miscarrying, the grieving process, and your future pregnancy plans.  CAUSES    Problems with the fetal chromosomes that make it impossible for the baby to develop normally. Problems with the baby's genes or chromosomes are most often the result of errors that occur, by chance, as the embryo divides and grows. The problems are not inherited from the parents.   Infection of the cervix or uterus.    Hormone problems.    Problems with the cervix, such as having an incompetent cervix. This is when the tissue in the cervix is not strong enough to hold the pregnancy.    Problems with the uterus, such as an abnormally shaped uterus, uterine fibroids, or congenital abnormalities.    Certain medical conditions.    Smoking, drinking alcohol, or taking illegal drugs.    Trauma.   Often, the cause of a miscarriage is unknown.   SYMPTOMS    Vaginal bleeding or spotting, with or without cramps or pain.   Pain or cramping in the abdomen or lower back.   Passing fluid, tissue, or blood clots from the vagina.  DIAGNOSIS   Your caregiver will perform a physical exam. You may also have an ultrasound to confirm the miscarriage. Blood or urine tests may also be ordered.  TREATMENT    Sometimes, treatment is not necessary if you naturally pass all the fetal tissue that was in the uterus. If some of the fetus or placenta remains in the body (incomplete miscarriage), tissue left behind may become infected and must be removed. Usually, a dilation and curettage (D and C) procedure is performed.  During a D and C procedure, the cervix is widened (dilated) and any remaining fetal or placental tissue is gently removed from the uterus.   Antibiotic medicines are prescribed if there is an infection. Other medicines may be given to reduce the size of the uterus (contract) if there is a lot of bleeding.   If you have Rh negative blood and your baby was Rh positive, you will need a Rh immunoglobulin shot. This shot will protect any future baby from having Rh blood problems in future pregnancies.  HOME CARE INSTRUCTIONS    Your caregiver may order bed rest or may allow you to continue light activity. Resume activity as directed by your caregiver.   Have someone help with home and family responsibilities during this time.    Keep track of the number of sanitary pads you use each day and how soaked (saturated) they are. Write down this information.    Do not use tampons. Do not douche or have sexual intercourse until approved by your caregiver.    Only take over-the-counter or prescription medicines for pain or discomfort as directed by your caregiver.    Do not take aspirin. Aspirin can cause bleeding.    Keep all follow-up appointments with your caregiver.    If you or your partner have problems with grieving, talk to your caregiver or seek counseling to help cope with the pregnancy loss. Allow enough time to grieve before trying to get pregnant again.     SEEK IMMEDIATE MEDICAL CARE IF:    You have severe cramps or pain in your back or abdomen.   You have a fever.   You pass large blood clots (walnut-sized or larger) ortissue from your vagina. Save any tissue for your caregiver to inspect.    Your bleeding increases.    You have a thick, bad-smelling vaginal discharge.   You become lightheaded, weak, or you faint.    You have chills.   MAKE SURE YOU:   Understand these instructions.   Will watch your condition.   Will get help right away if you are not doing well or get worse.     This  information is not intended to replace advice given to you by your health care provider. Make sure you discuss any questions you have with your health care provider.     Document Released: 09/15/2000 Document Revised: 07/17/2012 Document Reviewed: 05/11/2011  Elsevier Interactive Patient Education 2016 Elsevier Inc[employer]

## 2015-03-17 NOTE — Progress Notes (Signed)
Here for follow up after miscarriage.  States bleeding stopped 03/15/15. Denies pelvic pain , but states epigastric /upper abdominal pain=3. States has intercourse 03/15/15 without protection.

## 2015-03-18 LAB — HCG, QUANTITATIVE, PREGNANCY: HCG, BETA CHAIN, QUANT, S: 14.4 m[IU]/mL — AB

## 2015-03-20 ENCOUNTER — Inpatient Hospital Stay (HOSPITAL_COMMUNITY)
Admission: AD | Admit: 2015-03-20 | Discharge: 2015-03-20 | Disposition: A | Discharge status: Home / Self Care | Payer: Medicaid Other | Source: Ambulatory Visit | Attending: Student | Admitting: Student

## 2015-03-20 ENCOUNTER — Inpatient Hospital Stay (HOSPITAL_COMMUNITY): Payer: Medicaid Other

## 2015-03-20 ENCOUNTER — Encounter (HOSPITAL_COMMUNITY): Payer: Self-pay | Admitting: *Deleted

## 2015-03-20 ENCOUNTER — Inpatient Hospital Stay (HOSPITAL_COMMUNITY)
Admission: AD | Admit: 2015-03-20 | Discharge: 2015-03-20 | Disposition: A | Discharge status: Home / Self Care | Payer: Medicaid Other | Source: Ambulatory Visit | Attending: Emergency Medicine | Admitting: Emergency Medicine

## 2015-03-20 DIAGNOSIS — R1011 Right upper quadrant pain: Secondary | ICD-10-CM

## 2015-03-20 DIAGNOSIS — D734 Cyst of spleen: Secondary | ICD-10-CM

## 2015-03-20 DIAGNOSIS — R1012 Left upper quadrant pain: Secondary | ICD-10-CM

## 2015-03-20 DIAGNOSIS — Z792 Long term (current) use of antibiotics: Secondary | ICD-10-CM | POA: Insufficient documentation

## 2015-03-20 DIAGNOSIS — R1010 Upper abdominal pain, unspecified: Secondary | ICD-10-CM | POA: Insufficient documentation

## 2015-03-20 DIAGNOSIS — R1013 Epigastric pain: Secondary | ICD-10-CM

## 2015-03-20 DIAGNOSIS — H913 Deaf nonspeaking, not elsewhere classified: Secondary | ICD-10-CM | POA: Insufficient documentation

## 2015-03-20 DIAGNOSIS — R109 Unspecified abdominal pain: Secondary | ICD-10-CM

## 2015-03-20 DIAGNOSIS — Z87440 Personal history of urinary (tract) infections: Secondary | ICD-10-CM | POA: Insufficient documentation

## 2015-03-20 DIAGNOSIS — Z3A01 Less than 8 weeks gestation of pregnancy: Secondary | ICD-10-CM | POA: Insufficient documentation

## 2015-03-20 DIAGNOSIS — O9989 Other specified diseases and conditions complicating pregnancy, childbirth and the puerperium: Secondary | ICD-10-CM | POA: Insufficient documentation

## 2015-03-20 DIAGNOSIS — R110 Nausea: Secondary | ICD-10-CM | POA: Insufficient documentation

## 2015-03-20 DIAGNOSIS — Z79899 Other long term (current) drug therapy: Secondary | ICD-10-CM | POA: Insufficient documentation

## 2015-03-20 LAB — COMPREHENSIVE METABOLIC PANEL
ALK PHOS: 51 U/L (ref 38–126)
ALT: 17 U/L (ref 14–54)
ALT: 15 U/L (ref 14–54)
ANION GAP: 7 (ref 5–15)
ANION GAP: 9 (ref 5–15)
AST: 24 U/L (ref 15–41)
AST: 16 U/L (ref 15–41)
Albumin: 4.5 g/dL (ref 3.5–5.0)
Albumin: 4.3 g/dL (ref 3.5–5.0)
Alkaline Phosphatase: 53 U/L (ref 38–126)
BILIRUBIN TOTAL: 0.6 mg/dL (ref 0.3–1.2)
BUN: 10 mg/dL (ref 6–20)
BUN: 8 mg/dL (ref 6–20)
CALCIUM: 9.3 mg/dL (ref 8.9–10.3)
CHLORIDE: 102 mmol/L (ref 101–111)
CO2: 28 mmol/L (ref 22–32)
CO2: 26 mmol/L (ref 22–32)
CREATININE: 0.66 mg/dL (ref 0.44–1.00)
Calcium: 9.5 mg/dL (ref 8.9–10.3)
Chloride: 106 mmol/L (ref 101–111)
Creatinine, Ser: 0.66 mg/dL (ref 0.44–1.00)
Glucose, Bld: 96 mg/dL (ref 65–99)
Glucose, Bld: 89 mg/dL (ref 65–99)
POTASSIUM: 3.7 mmol/L (ref 3.5–5.1)
Potassium: 4.2 mmol/L (ref 3.5–5.1)
SODIUM: 141 mmol/L (ref 135–145)
Sodium: 137 mmol/L (ref 135–145)
TOTAL PROTEIN: 7.6 g/dL (ref 6.5–8.1)
Total Bilirubin: 0.3 mg/dL (ref 0.3–1.2)
Total Protein: 8.1 g/dL (ref 6.5–8.1)

## 2015-03-20 LAB — URINALYSIS, ROUTINE W REFLEX MICROSCOPIC
Bilirubin Urine: NEGATIVE
Glucose, UA: NEGATIVE mg/dL
Ketones, ur: NEGATIVE mg/dL
Leukocytes, UA: NEGATIVE
NITRITE: NEGATIVE
PROTEIN: NEGATIVE mg/dL
SPECIFIC GRAVITY, URINE: 1.025 (ref 1.005–1.030)
pH: 5.5 (ref 5.0–8.0)

## 2015-03-20 LAB — CBC WITH DIFFERENTIAL/PLATELET
Basophils Absolute: 0 10*3/uL (ref 0.0–0.1)
Basophils Relative: 0 %
EOS ABS: 0.2 10*3/uL (ref 0.0–0.7)
EOS PCT: 3 %
HCT: 38.3 % (ref 36.0–46.0)
HEMOGLOBIN: 12.7 g/dL (ref 12.0–15.0)
LYMPHS ABS: 2.1 10*3/uL (ref 0.7–4.0)
LYMPHS PCT: 34 %
MCH: 29.7 pg (ref 26.0–34.0)
MCHC: 33.2 g/dL (ref 30.0–36.0)
MCV: 89.7 fL (ref 78.0–100.0)
MONOS PCT: 7 %
Monocytes Absolute: 0.4 10*3/uL (ref 0.1–1.0)
NEUTROS PCT: 56 %
Neutro Abs: 3.5 10*3/uL (ref 1.7–7.7)
Platelets: 296 10*3/uL (ref 150–400)
RBC: 4.27 MIL/uL (ref 3.87–5.11)
RDW: 12.5 % (ref 11.5–15.5)
WBC: 6.3 10*3/uL (ref 4.0–10.5)

## 2015-03-20 LAB — CBC
HCT: 36.9 % (ref 36.0–46.0)
Hemoglobin: 12.2 g/dL (ref 12.0–15.0)
MCH: 29.3 pg (ref 26.0–34.0)
MCHC: 33.1 g/dL (ref 30.0–36.0)
MCV: 88.7 fL (ref 78.0–100.0)
PLATELETS: 289 10*3/uL (ref 150–400)
RBC: 4.16 MIL/uL (ref 3.87–5.11)
RDW: 12.7 % (ref 11.5–15.5)
WBC: 7.7 10*3/uL (ref 4.0–10.5)

## 2015-03-20 LAB — AMYLASE: Amylase: 51 U/L (ref 28–100)

## 2015-03-20 LAB — LIPASE, BLOOD: Lipase: 30 U/L (ref 11–51)

## 2015-03-20 LAB — URINE MICROSCOPIC-ADD ON

## 2015-03-20 LAB — HCG, QUANTITATIVE, PREGNANCY: HCG, BETA CHAIN, QUANT, S: 8 m[IU]/mL — AB (ref <5)

## 2015-03-20 MED ORDER — RANITIDINE HCL 150 MG/10ML PO SYRP
150.0000 mg | ORAL_SOLUTION | Freq: Once | ORAL | Status: AC
  Administered 2015-03-20: 150 mg via ORAL
  Filled 2015-03-20: qty 10

## 2015-03-20 MED ORDER — NAPROXEN 500 MG PO TABS: 500.0000 mg | ORAL_TABLET | Freq: Two times a day (BID) | ORAL | Status: DC | PRN

## 2015-03-20 MED ORDER — IOHEXOL 300 MG/ML  SOLN
100.0000 mL | Freq: Once | INTRAMUSCULAR | Status: AC | PRN
  Administered 2015-03-20: 100 mL via INTRAVENOUS

## 2015-03-20 MED ORDER — SODIUM CHLORIDE 0.9 % IV BOLUS (SEPSIS)
1000.0000 mL | Freq: Once | INTRAVENOUS | Status: AC
  Administered 2015-03-20: 1000 mL via INTRAVENOUS

## 2015-03-20 MED ORDER — SUCRALFATE 1 G PO TABS: 1.0000 g | ORAL_TABLET | Freq: Once | ORAL | Status: DC

## 2015-03-20 MED ORDER — IOHEXOL 300 MG/ML  SOLN
25.0000 mL | Freq: Once | INTRAMUSCULAR | Status: AC | PRN
  Administered 2015-03-20: 25 mL via ORAL

## 2015-03-20 MED ORDER — SODIUM CHLORIDE 0.9 % IV SOLN
INTRAVENOUS | Status: DC
  Administered 2015-03-20: 16:00:00 via INTRAVENOUS

## 2015-03-20 MED ORDER — SUCRALFATE 1 GM/10ML PO SUSP: 1.0000 g | Freq: Three times a day (TID) | ORAL | Status: DC

## 2015-03-20 MED ORDER — HYDROMORPHONE HCL 1 MG/ML IJ SOLN
1.0000 mg | Freq: Once | INTRAMUSCULAR | Status: AC
Start: 2015-03-20 — End: 2015-03-20
  Administered 2015-03-20: 1 mg via INTRAVENOUS
  Filled 2015-03-20: qty 1

## 2015-03-20 MED ORDER — GI COCKTAIL ~~LOC~~
30.0000 mL | Freq: Once | ORAL | Status: AC
  Administered 2015-03-20: 30 mL via ORAL
  Filled 2015-03-20: qty 30

## 2015-03-20 MED ORDER — ONDANSETRON HCL 4 MG/2ML IJ SOLN
4.0000 mg | Freq: Once | INTRAMUSCULAR | Status: AC
  Administered 2015-03-20: 4 mg via INTRAVENOUS
  Filled 2015-03-20: qty 2

## 2015-03-20 MED ORDER — PANTOPRAZOLE SODIUM 40 MG PO TBEC
40.0000 mg | DELAYED_RELEASE_TABLET | Freq: Every day | ORAL | Status: DC
  Administered 2015-03-20: 40 mg via ORAL
  Filled 2015-03-20: qty 1

## 2015-03-20 MED ORDER — SUCRALFATE 1 GM/10ML PO SUSP
1.0000 g | Freq: Once | ORAL | Status: AC
  Administered 2015-03-20: 1 g via ORAL
  Filled 2015-03-20: qty 10

## 2015-03-20 MED ORDER — ACETAMINOPHEN 500 MG PO TABS
1000.0000 mg | ORAL_TABLET | Freq: Once | ORAL | Status: AC
  Administered 2015-03-20: 1000 mg via ORAL
  Filled 2015-03-20: qty 2

## 2015-03-20 MED ORDER — RANITIDINE HCL 150 MG PO TABS: 150.0000 mg | ORAL_TABLET | Freq: Two times a day (BID) | ORAL | Status: DC

## 2015-03-20 MED ORDER — HYDROMORPHONE HCL 1 MG/ML IJ SOLN
1.0000 mg | Freq: Once | INTRAMUSCULAR | Status: AC
  Administered 2015-03-20: 1 mg via INTRAMUSCULAR
  Filled 2015-03-20: qty 1

## 2015-03-20 MED ORDER — HYDROCODONE-ACETAMINOPHEN 5-325 MG PO TABS: 1.0000 | ORAL_TABLET | Freq: Four times a day (QID) | ORAL | Status: DC | PRN

## 2015-03-20 MED ORDER — KETOROLAC TROMETHAMINE 30 MG/ML IJ SOLN
30.0000 mg | Freq: Once | INTRAMUSCULAR | Status: AC
  Administered 2015-03-20: 30 mg via INTRAVENOUS
  Filled 2015-03-20: qty 1

## 2015-03-20 MED ORDER — OMEPRAZOLE 20 MG PO CPDR: 20.0000 mg | DELAYED_RELEASE_CAPSULE | Freq: Every day | ORAL | Status: DC

## 2015-03-20 NOTE — Discharge Instructions (Signed)

## 2015-03-20 NOTE — ED Provider Notes (Signed)
CSN: [id number]     Arrival date & time 03/20/15  1117 History   First MD Initiated Contact with Patient 03/20/15 1645     Chief Complaint  Patient presents with  . Nausea  . Abdominal Pain     (Consider location/radiation/quality/duration/timing/severity/associated sxs/prior Treatment) Patient is a 24 y.o. female presenting with abdominal pain. The history is provided by the patient. The history is limited by a language barrier. A language interpreter was used.  Abdominal Pain Pain location:  Epigastric Pain quality: aching and sharp   Pain radiates to:  Does not radiate Pain severity:  Moderate Onset quality:  Gradual Duration:  8 weeks Timing:  Constant Progression:  Worsening Chronicity:  New Context: not alcohol use, not previous surgeries, not suspicious food intake and not trauma   Relieved by:  Nothing Worsened by:  Nothing tried Associated symptoms: vomiting   Associated symptoms: no fever and no shortness of breath   Risk factors: pregnancy (recent SAB)     Past Medical History  Diagnosis Date  . Deaf   . UTI (lower urinary tract infection)    Past Surgical History  Procedure Laterality Date  . Tonsillectomy    . Cochlear implant    . Rhinoplasty    . Right foot  2016    cyst removed   No family history on file. Social History  Substance Use Topics  . Smoking status: Never Smoker   . Smokeless tobacco: Never Used  . Alcohol Use: No   OB History    Gravida Para Term Preterm AB TAB SAB Ectopic Multiple Living   2 1 1  1  1   1      Review of Systems  Constitutional: Negative for fever.  Respiratory: Negative for shortness of breath.   Gastrointestinal: Positive for vomiting and abdominal pain.  All other systems reviewed and are negative.     Allergies  Codeine  Home Medications   Prior to Admission medications   Medication Sig Start Date End Date Taking? Authorizing Provider  acetaminophen (TYLENOL) 500 MG tablet Take 1,000 mg by mouth  every 6 (six) hours as needed for mild pain.   Yes Historical Provider, MD  doxycycline (VIBRAMYCIN) 25 MG/5ML SUSR Take 20 mLs (100 mg total) by mouth 2 (two) times daily. 03/11/15  Yes [name], CNM  oxyCODONE-acetaminophen (ROXICET) 5-325 MG/5ML solution Take 5-10 mLs by mouth every 4 (four) hours as needed for severe pain. 03/11/15  Yes [name], CNM  Prenatal Vit-Fe Fumarate-FA (PRENATAL MULTIVITAMIN) TABS tablet Take 1 tablet by mouth daily.   Yes Historical Provider, MD  docusate sodium (COLACE) 100 MG capsule Take 1 capsule (100 mg total) by mouth every 12 (twelve) hours. Patient not taking: Reported on 03/17/2015 02/22/15   Jorje Guild, NP  ibuprofen (ADVIL,MOTRIN) 100 MG/5ML suspension Take 10 mLs (200 mg total) by mouth every 4 (four) hours as needed. Patient not taking: Reported on 03/17/2015 03/11/15   [name], CNM  promethazine (PHENERGAN) 25 MG tablet Take 1 tablet (25 mg total) by mouth every 6 (six) hours as needed for nausea or vomiting. Patient not taking: Reported on 03/17/2015 02/22/15   Jorje Guild, NP   BP 115/77 mmHg  Pulse 74  Temp(Src) 98.1 F (36.7 C) (Oral)  Resp 16  SpO2 98%  LMP 01/18/2015  Breastfeeding? Unknown Physical Exam  Constitutional: She is oriented to person, place, and time. She appears well-developed and well-nourished. No distress.  HENT:  Head: Normocephalic.  Eyes: Conjunctivae are normal.  Neck: Neck supple. No tracheal deviation present.  Cardiovascular: Normal rate, regular rhythm and normal heart sounds.   Pulmonary/Chest: Effort normal and breath sounds normal. No respiratory distress.  Abdominal: Soft. Normal appearance. She exhibits no distension and no mass. There is no hepatosplenomegaly. There is tenderness (diffuse upper and right sided). There is no rigidity, no rebound, no guarding and no CVA tenderness.    Neurological: She is alert and oriented to person, place, and time.  Skin: Skin is warm and dry.    Psychiatric: She has a normal mood and affect.  Vitals reviewed.   ED Course  Procedures (including critical care time) Labs Review Labs Reviewed  AMYLASE  LIPASE, BLOOD  CBC WITH DIFFERENTIAL/PLATELET  COMPREHENSIVE METABOLIC PANEL    Imaging Review US Abdomen Complete  03/20/2015  CLINICAL DATA:  Abdominal pain EXAM: ULTRASOUND ABDOMEN COMPLETE COMPARISON:  None. FINDINGS: Gallbladder: No gallstones or wall thickening visualized. No sonographic Murphy sign noted. Common bile duct: Diameter: 2.3 mm in diameter within normal limits. Liver: No focal lesion identified. Within normal limits in parenchymal echogenicity. IVC: No abnormality visualized. Pancreas: Not visualized due to abundant bowel gas. Spleen: Spleen measures 5.6 cm in length. There is a complex cyst in the splenic hilum measures 5.5 x 5.5 cm. Some septation and layering material noted within cyst. This may represent a splenic cyst, pancreatic tail cyst or renal cyst. Further correlation with enhanced CT or MRI is recommended. Right Kidney: Length: 10.6 cm in length. Echogenicity within normal limits. No mass or hydronephrosis visualized. Left Kidney: Length: 11.6 cm in length. Echogenicity within normal limits. No mass or hydronephrosis visualized. Abdominal aorta: No aneurysm visualized. Measures up to 1.7 cm in diameter. Other findings: None. IMPRESSION: 1. No gallstones are noted within gallbladder.  Normal CBD. 2. There is a complex cyst in splenic hilum measures 5.5 x 5.5 cm. May represent a splenic cyst, pancreatic tail cyst or left renal cyst. Further evaluation with enhanced CT or MRI is recommended. 3. No hydronephrosis.  No renal calculi. 4. No aortic aneurysm. Electronically Signed   By: Lahoma Crocker M.D.   On: 03/20/2015 14:09   Ct Abdomen Pelvis W Contrast  03/20/2015  CLINICAL DATA:  Abdominal pain. History of recent spontaneous abortion. This is the third visit to [facility] with this type of pain. Pain has been present  since October. Occasional nausea. Pain persists despite treatment. EXAM: CT ABDOMEN AND PELVIS WITH CONTRAST TECHNIQUE: Multidetector CT imaging of the abdomen and pelvis was performed using the standard protocol following bolus administration of intravenous contrast. CONTRAST:  34mL OMNIPAQUE IOHEXOL 300 MG/ML SOLN, 175mL OMNIPAQUE IOHEXOL 300 MG/ML SOLN COMPARISON:  None. FINDINGS: The lung bases are clear. The liver, spleen, gallbladder, adrenal glands, kidneys, abdominal aorta, inferior vena cava, and retroperitoneal lymph nodes are unremarkable. The there is a circumscribed cystic collection in the left upper quadrant adjacent to the pancreatic tail. This measures about 5.7 x 4.5 cm and has a moderately thickened wall and internal septation. Etiology is indeterminate. The lesion abuts the spleen and tail of the pancreas and may represent a cystic pancreatic lesion such as a mucinous tumor or it could represent an old pseudo cyst, or a splenic cyst or hemangioma. The lesion also abuts the anterior wall of the left kidney but appears separate from the kidney. Mesenteric cystic lesions could also have this appearance. The stomach, small bowel, and colon are not abnormally distended. No free air or free fluid in the abdomen. Abdominal wall musculature appears intact.  Pelvis: The appendix is normal. Uterus and ovaries are not enlarged. Small amount of free fluid in the pelvis is likely physiologic. No loculated fluid collections. Bladder wall is not thickened. No pelvic mass or lymphadenopathy. No evidence of diverticulitis. No destructive bone lesions. IMPRESSION: 5.7 cm maximal diameter complex cystic mass in the left upper quadrant may arise from pancreas, spleen, or mesenteric origin. MRI may be useful in further characterization of this lesion. Electronically Signed   By: Lucienne Capers M.D.   On: 03/20/2015 20:48   I have personally reviewed and evaluated these images and lab results as part of my medical  decision-making.   EKG Interpretation None      MDM   Final diagnoses:  Bilateral upper abdominal pain  Splenic cyst  Epigastric pain    24 y.o. female presents with upper abdominal pain that has been ongoing for last 2 months. It is epigastric in nature. Has been seen for spontaneous abortion in outpatient setting by OB/GYN here. Has previous imaging with gestational sac. Has had serial hCG which has been lowering and is now 8. She was sent over for the pain and incidental finding of a cyst at the base of her spleen of uncertain origin. Considered ectopic and molar pregnancy potential given lack of fetal pole in previous gestational sac. Discussed this with OB/GYN after CT scan and they do not feel this represents an ectopic pregnancy with current level of hCG. Discussed with general surgery who recommended outpatient follow-up for this lesion. Patient unable to undergo MRI due to cochlear implant for further characterization.   I discussed possibility of alternate diagnoses that are unrelated to this finding that may be causing the patient's pain which is currently midline or right sided. Will treat with PPI, H2 blocker, Carafate and NSAIDs as patient stated Toradol helped most with pain. No further inpatient workup is warranted at this point. Strict return precautions were discussed for worsening of symptoms, uncontrollable vomiting, fevers, or other concerning symptoms.   Leo Grosser, MD 03/21/15 [phone]

## 2015-03-20 NOTE — Progress Notes (Signed)
Patient listed as having [employer] insurance.  Pcp listed on patient's insurance card is located at [facility] [phone].

## 2015-03-20 NOTE — MAU Provider Note (Signed)
History     CSN: [id number]  Arrival date and time: 03/20/15 [id number]   First Provider Initiated Contact with Patient 03/20/15 0126      Chief Complaint  Patient presents with  . Abdominal Pain   HPI Comments: Diana Torres is a 24 y.o. [id number] who presents today with upper abdominal pain. She has had this pain for over a week, and mentioned it at her last appointment here. She states that it has continued, and it worse today. Patient recently had a SAB that is resolving. HCG is dropping, and patient reports that bleeding has stopped. She states that all her pain is in her upper abdomen. She denies lower abdominal pain.  Abdominal Pain This is a new problem. The current episode started 1 to 4 weeks ago. The onset quality is gradual. The problem occurs intermittently. The problem has been unchanged. The pain is located in the epigastric region and generalized abdominal region. The pain is at a severity of 8/10. The quality of the pain is colicky. The abdominal pain radiates to the epigastric region, periumbilical region and suprapubic region. Associated symptoms include nausea. Pertinent negatives include no constipation, diarrhea, dysuria, fever, frequency or vomiting. Nothing aggravates the pain. The pain is relieved by nothing. Treatments tried: oxycodone  The treatment provided no relief.     Past Medical History  Diagnosis Date  . Deaf   . UTI (lower urinary tract infection)     Past Surgical History  Procedure Laterality Date  . Tonsillectomy    . Cochlear implant    . Rhinoplasty    . Right foot  2016    cyst removed    History reviewed. No pertinent family history.  Social History  Substance Use Topics  . Smoking status: Never Smoker   . Smokeless tobacco: Never Used  . Alcohol Use: No    Allergies:  Allergies  Allergen Reactions  . Codeine Itching and Rash    Prescriptions prior to admission  Medication Sig Dispense Refill Last Dose  . acetaminophen  (TYLENOL) 500 MG tablet Take 1,000 mg by mouth every 6 (six) hours as needed for mild pain.   03/19/2015 at Unknown time  . doxycycline (VIBRAMYCIN) 25 MG/5ML SUSR Take 20 mLs (100 mg total) by mouth 2 (two) times daily. 300 mL 0 03/19/2015 at Unknown time  . Prenatal Vit-Fe Fumarate-FA (PRENATAL MULTIVITAMIN) TABS tablet Take 1 tablet by mouth daily.   03/19/2015 at Unknown time  . docusate sodium (COLACE) 100 MG capsule Take 1 capsule (100 mg total) by mouth every 12 (twelve) hours. (Patient not taking: Reported on 03/17/2015) 60 capsule 0 Not Taking  . ibuprofen (ADVIL,MOTRIN) 100 MG/5ML suspension Take 10 mLs (200 mg total) by mouth every 4 (four) hours as needed. (Patient not taking: Reported on 03/17/2015) 150 mL 1 Not Taking  . oxyCODONE-acetaminophen (ROXICET) 5-325 MG/5ML solution Take 5-10 mLs by mouth every 4 (four) hours as needed for severe pain. 150 mL 0 03/19/2015 at 2200  . promethazine (PHENERGAN) 25 MG tablet Take 1 tablet (25 mg total) by mouth every 6 (six) hours as needed for nausea or vomiting. (Patient not taking: Reported on 03/17/2015) 30 tablet 0 Not Taking    Review of Systems  Constitutional: Negative for fever and chills.  Gastrointestinal: Positive for nausea and abdominal pain. Negative for vomiting, diarrhea and constipation.  Genitourinary: Negative for dysuria, urgency and frequency.   Physical Exam   Blood pressure 122/81, pulse 82, temperature 98.6 F (37 C), temperature source Oral,  resp. rate 20, last menstrual period 01/18/2015.  Physical Exam  Nursing note and vitals reviewed. Constitutional: She is oriented to person, place, and time. She appears well-developed and well-nourished. No distress.  HENT:  Head: Normocephalic.  Cardiovascular: Normal rate.   Respiratory: Effort normal.  GI: Soft. There is tenderness (in RUQ ). There is no rebound and no guarding.  Neurological: She is alert and oriented to person, place, and time.  Skin: Skin is warm  and dry.  Psychiatric: She has a normal mood and affect.   Results for orders placed or performed during the hospital encounter of 03/20/15 (from the past 24 hour(s))  Urinalysis, Routine w reflex microscopic (not at [facility])     Status: Abnormal   Collection Time: 03/20/15  1:04 AM  Result Value Ref Range   Color, Urine YELLOW YELLOW   APPearance CLEAR CLEAR   Specific Gravity, Urine 1.025 1.005 - 1.030   pH 5.5 5.0 - 8.0   Glucose, UA NEGATIVE NEGATIVE mg/dL   Hgb urine dipstick TRACE (A) NEGATIVE   Bilirubin Urine NEGATIVE NEGATIVE   Ketones, ur NEGATIVE NEGATIVE mg/dL   Protein, ur NEGATIVE NEGATIVE mg/dL   Nitrite NEGATIVE NEGATIVE   Leukocytes, UA NEGATIVE NEGATIVE  Urine microscopic-add on     Status: Abnormal   Collection Time: 03/20/15  1:04 AM  Result Value Ref Range   Squamous Epithelial / LPF 0-5 (A) NONE SEEN   WBC, UA 0-5 0 - 5 WBC/hpf   RBC / HPF 0-5 0 - 5 RBC/hpf   Bacteria, UA RARE (A) NONE SEEN   Urine-Other MUCOUS PRESENT   CBC     Status: None   Collection Time: 03/20/15  1:10 AM  Result Value Ref Range   WBC 7.7 4.0 - 10.5 K/uL   RBC 4.16 3.87 - 5.11 MIL/uL   Hemoglobin 12.2 12.0 - 15.0 g/dL   HCT 36.9 36.0 - 46.0 %   MCV 88.7 78.0 - 100.0 fL   MCH 29.3 26.0 - 34.0 pg   MCHC 33.1 30.0 - 36.0 g/dL   RDW 12.7 11.5 - 15.5 %   Platelets 289 150 - 400 K/uL  hCG, quantitative, pregnancy     Status: Abnormal   Collection Time: 03/20/15  1:10 AM  Result Value Ref Range   hCG, Beta Chain, Quant, S 8 (H) <5 mIU/mL  Comprehensive metabolic panel     Status: None   Collection Time: 03/20/15  1:10 AM  Result Value Ref Range   Sodium 137 135 - 145 mmol/L   Potassium 3.7 3.5 - 5.1 mmol/L   Chloride 102 101 - 111 mmol/L   CO2 28 22 - 32 mmol/L   Glucose, Bld 96 65 - 99 mg/dL   BUN 10 6 - 20 mg/dL   Creatinine, Ser 0.66 0.44 - 1.00 mg/dL   Calcium 9.5 8.9 - 10.3 mg/dL   Total Protein 8.1 6.5 - 8.1 g/dL   Albumin 4.5 3.5 - 5.0 g/dL   AST 24 15 - 41 U/L   ALT 17  14 - 54 U/L   Alkaline Phosphatase 53 38 - 126 U/L   Total Bilirubin 0.3 0.3 - 1.2 mg/dL   GFR calc non Af Amer >60 >60 mL/min   GFR calc Af Amer >60 >60 mL/min   Anion gap 7 5 - 15     MAU Course  Procedures  MDM D/W the patient recommendation for abdominal US. Patient needs to be NPO x 8 hours for [place]. Patient  would like to treat pain tonight, and return to [place] in the morning.  0226: Patient has had dilaudid. She reports that her pain has improved.   Assessment and Plan   1. RUQ pain    DC home Comfort measures reviewed  Order in for outpatient [place] later today when patient has been NPO long enough.  RX: none  Return to MAU as needed FU with OB as planned  Follow-up Information    Follow up with [facility].   Specialty:  Radiology   Why:  THEY WILL CALL YOU WITH AN APPOINTMENT    Contact information:   [address] [id number] [place] [phone]        Mathis Bud 03/20/2015, 1:40 AM

## 2015-03-20 NOTE — ED Notes (Signed)
MD at bedside. 

## 2015-03-20 NOTE — MAU Note (Signed)
PT IS DEAF-  USING TELE SKYPE   FOR INTERPRETING .     SAYS   SHE  CAME  HERE IN NOV-  TOLD  MAY  HAVE SAB-  THEN  ON 12-4-  HAD  SAB.    SHE  CAME HERE LAST WEEK  FOR  ABD PAIN.    TODAY  SHE  STILL HAS  PAIN    NO   BLEEDING.   NO MEDS   FOR  PAIN.

## 2015-03-20 NOTE — MAU Note (Signed)
Nausea is so bad.  Pain in upper abd is getting worse, dull constant.  Pain med doesn't even help.

## 2015-03-20 NOTE — ED Notes (Signed)
Bed: [id number] Expected date:  Expected time:  Means of arrival:  Comments: womens transfer

## 2015-03-20 NOTE — MAU Provider Note (Signed)
History     CSN: [id number]  Arrival date and time: 03/20/15 1117   First Provider Initiated Contact with Patient 03/20/15 1436      Chief Complaint  Patient presents with  . Nausea  . Abdominal Pain   HPI   Patient hearing impaired, family member at the bedside interpreting.   [name] is a 24 y.o. female G2P1001 with a history of recent SAB, last quant was 8 presents to MAU with worsening upper abdominal pain. She was seen last night around 0100 with this pain and received dilaudid for pain and was sent home and told to come back today for an abdominal US ( the patient had not been NPO last night which is required for abdominal US) the patient called early this morning and asked about when her [place] was supposed to be scheduled, she stated that her pain became worse and she couldn't take it anymore and came in. The patient was given dilaudid last night and says this helped, however wore off after 5 hours. This is the 3rd visit to MAU with this type of pain. She was given Percocet last week for the pain and says it did not help at all.   The patient is located in her upper abdomen, in the center, just below both breasts. The pain is described as dull pain that comes and goes. The pain has been there since October. She has occasional nausea.  Shortly after she started having this pain she found out she was pregnant; she attributed the pain to her pregnancy.   OB History    Gravida Para Term Preterm AB TAB SAB Ectopic Multiple Living   '2 1 1       1      '$ Past Medical History  Diagnosis Date  . Deaf   . UTI (lower urinary tract infection)     Past Surgical History  Procedure Laterality Date  . Tonsillectomy    . Cochlear implant    . Rhinoplasty    . Right foot  2016    cyst removed    No family history on file.  Social History  Substance Use Topics  . Smoking status: Never Smoker   . Smokeless tobacco: Never Used  . Alcohol Use: No    Allergies:  Allergies   Allergen Reactions  . Codeine Itching and Rash    Prescriptions prior to admission  Medication Sig Dispense Refill Last Dose  . acetaminophen (TYLENOL) 500 MG tablet Take 1,000 mg by mouth every 6 (six) hours as needed for mild pain.   03/19/2015 at Unknown time  . docusate sodium (COLACE) 100 MG capsule Take 1 capsule (100 mg total) by mouth every 12 (twelve) hours. (Patient not taking: Reported on 03/17/2015) 60 capsule 0 Not Taking  . doxycycline (VIBRAMYCIN) 25 MG/5ML SUSR Take 20 mLs (100 mg total) by mouth 2 (two) times daily. 300 mL 0 03/19/2015 at Unknown time  . ibuprofen (ADVIL,MOTRIN) 100 MG/5ML suspension Take 10 mLs (200 mg total) by mouth every 4 (four) hours as needed. (Patient not taking: Reported on 03/17/2015) 150 mL 1 Not Taking  . oxyCODONE-acetaminophen (ROXICET) 5-325 MG/5ML solution Take 5-10 mLs by mouth every 4 (four) hours as needed for severe pain. 150 mL 0 03/19/2015 at 2200  . Prenatal Vit-Fe Fumarate-FA (PRENATAL MULTIVITAMIN) TABS tablet Take 1 tablet by mouth daily.   03/19/2015 at Unknown time  . promethazine (PHENERGAN) 25 MG tablet Take 1 tablet (25 mg total) by mouth every 6 (  six) hours as needed for nausea or vomiting. (Patient not taking: Reported on 03/17/2015) 30 tablet 0 Not Taking     Results for orders placed or performed during the hospital encounter of 03/20/15 (from the past 72 hour(s))  Amylase     Status: None   Collection Time: 03/20/15  3:05 PM  Result Value Ref Range   Amylase 51 28 - 100 U/L  Lipase, blood     Status: None   Collection Time: 03/20/15  3:05 PM  Result Value Ref Range   Lipase 30 11 - 51 U/L     Recent Results (from the past 2160 hour(s))  GC/Chlamydia probe amp ([place])not at [facility]     Status: None   Collection Time: 02/22/15 12:00 AM  Result Value Ref Range   Chlamydia Negative     Comment: Normal Reference Range - Negative   Neisseria gonorrhea Negative     Comment: Normal Reference Range - Negative   Urinalysis, Routine w reflex microscopic (not at [facility])     Status: None   Collection Time: 02/22/15  4:35 PM  Result Value Ref Range   Color, Urine YELLOW YELLOW   APPearance CLEAR CLEAR   Specific Gravity, Urine 1.015 1.005 - 1.030   pH 6.5 5.0 - 8.0   Glucose, UA NEGATIVE NEGATIVE mg/dL   Hgb urine dipstick NEGATIVE NEGATIVE   Bilirubin Urine NEGATIVE NEGATIVE   Ketones, ur NEGATIVE NEGATIVE mg/dL   Protein, ur NEGATIVE NEGATIVE mg/dL   Nitrite NEGATIVE NEGATIVE   Leukocytes, UA NEGATIVE NEGATIVE    Comment: MICROSCOPIC NOT DONE ON URINES WITH NEGATIVE PROTEIN, BLOOD, LEUKOCYTES, NITRITE, OR GLUCOSE <1000 mg/dL.  Pregnancy, urine POC     Status: Abnormal   Collection Time: 02/22/15  4:42 PM  Result Value Ref Range   Preg Test, Ur POSITIVE (A) NEGATIVE    Comment:        THE SENSITIVITY OF THIS METHODOLOGY IS >24 mIU/mL   CBC     Status: None   Collection Time: 02/22/15  5:12 PM  Result Value Ref Range   WBC 9.6 4.0 - 10.5 K/uL   RBC 4.12 3.87 - 5.11 MIL/uL   Hemoglobin 12.0 12.0 - 15.0 g/dL   HCT 36.0 36.0 - 46.0 %   MCV 87.4 78.0 - 100.0 fL   MCH 29.1 26.0 - 34.0 pg   MCHC 33.3 30.0 - 36.0 g/dL   RDW 12.9 11.5 - 15.5 %   Platelets 246 150 - 400 K/uL  ABO/Rh     Status: None   Collection Time: 02/22/15  5:12 PM  Result Value Ref Range   ABO/RH(D) O POS   hCG, quantitative, pregnancy     Status: Abnormal   Collection Time: 02/22/15  5:12 PM  Result Value Ref Range   hCG, Beta Chain, Quant, S 224 (H) <5 mIU/mL    Comment:          GEST. AGE      CONC.  (mIU/mL)   <=1 WEEK        5 - 50     2 WEEKS       50 - 500     3 WEEKS       100 - 10,000     4 WEEKS     1,000 - 30,000     5 WEEKS     3,500 - 115,000   6-8 WEEKS     12,000 - 270,000    12 WEEKS  15,000 - 220,000        FEMALE AND NON-PREGNANT FEMALE:     LESS THAN 5 mIU/mL   HIV antibody     Status: None   Collection Time: 02/22/15  5:12 PM  Result Value Ref Range   HIV Screen 4th Generation wRfx Non  Reactive Non Reactive    Comment: (NOTE) Performed At: [facility] [address] [place], [place] [id number] Lindon Romp MD BD:[id number]   Wet prep, genital     Status: Abnormal   Collection Time: 02/22/15  7:09 PM  Result Value Ref Range   Yeast Wet Prep HPF POC NONE SEEN NONE SEEN   Trich, Wet Prep NONE SEEN NONE SEEN   Clue Cells Wet Prep HPF POC NONE SEEN NONE SEEN   WBC, Wet Prep HPF POC FEW (A) NONE SEEN    Comment: FEW BACTERIA SEEN   Sperm NONE SEEN   hCG, quantitative, pregnancy     Status: Abnormal   Collection Time: 02/24/15  3:06 PM  Result Value Ref Range   hCG, Beta Chain, Quant, S 385 (H) <5 mIU/mL    Comment:          GEST. AGE      CONC.  (mIU/mL)   <=1 WEEK        5 - 50     2 WEEKS       50 - 500     3 WEEKS       100 - 10,000     4 WEEKS     1,000 - 30,000     5 WEEKS     3,500 - 115,000   6-8 WEEKS     12,000 - 270,000    12 WEEKS     15,000 - 220,000        FEMALE AND NON-PREGNANT FEMALE:     LESS THAN 5 mIU/mL   hCG, quantitative, pregnancy     Status: Abnormal   Collection Time: 02/26/15 10:45 AM  Result Value Ref Range   hCG, Beta Chain, Quant, S 526 (H) <5 mIU/mL    Comment:          GEST. AGE      CONC.  (mIU/mL)   <=1 WEEK        5 - 50     2 WEEKS       50 - 500     3 WEEKS       100 - 10,000     4 WEEKS     1,000 - 30,000     5 WEEKS     3,500 - 115,000   6-8 WEEKS     12,000 - 270,000    12 WEEKS     15,000 - 220,000        FEMALE AND NON-PREGNANT FEMALE:     LESS THAN 5 mIU/mL   hCG, quantitative, pregnancy     Status: Abnormal   Collection Time: 03/01/15 10:21 AM  Result Value Ref Range   hCG, Beta Chain, Quant, S 823 (H) <5 mIU/mL    Comment:          GEST. AGE      CONC.  (mIU/mL)   <=1 WEEK        5 - 50     2 WEEKS       50 - 500     3 WEEKS       100 - 10,000  4 WEEKS     1,000 - 30,000     5 WEEKS     3,500 - 115,000   6-8 WEEKS     12,000 - 270,000    12 WEEKS     15,000 - 220,000        FEMALE AND  NON-PREGNANT FEMALE:     LESS THAN 5 mIU/mL   hCG, quantitative, pregnancy     Status: Abnormal   Collection Time: 03/07/15  2:58 PM  Result Value Ref Range   hCG, Beta Chain, Quant, S 2104 (H) <5 mIU/mL    Comment:          GEST. AGE      CONC.  (mIU/mL)   <=1 WEEK        5 - 50     2 WEEKS       50 - 500     3 WEEKS       100 - 10,000     4 WEEKS     1,000 - 30,000     5 WEEKS     3,500 - 115,000   6-8 WEEKS     12,000 - 270,000    12 WEEKS     15,000 - 220,000        FEMALE AND NON-PREGNANT FEMALE:     LESS THAN 5 mIU/mL   hCG, quantitative, pregnancy     Status: Abnormal   Collection Time: 03/11/15 11:40 AM  Result Value Ref Range   hCG, Beta Chain, Quant, S 222 (H) <5 mIU/mL    Comment:          GEST. AGE      CONC.  (mIU/mL)   <=1 WEEK        5 - 50     2 WEEKS       50 - 500     3 WEEKS       100 - 10,000     4 WEEKS     1,000 - 30,000     5 WEEKS     3,500 - 115,000   6-8 WEEKS     12,000 - 270,000    12 WEEKS     15,000 - 220,000        FEMALE AND NON-PREGNANT FEMALE:     LESS THAN 5 mIU/mL   CBC     Status: None   Collection Time: 03/11/15 11:41 AM  Result Value Ref Range   WBC 8.5 4.0 - 10.5 K/uL   RBC 4.10 3.87 - 5.11 MIL/uL   Hemoglobin 12.0 12.0 - 15.0 g/dL   HCT 36.0 36.0 - 46.0 %   MCV 87.8 78.0 - 100.0 fL   MCH 29.3 26.0 - 34.0 pg   MCHC 33.3 30.0 - 36.0 g/dL   RDW 13.0 11.5 - 15.5 %   Platelets 274 150 - 400 K/uL  Culture, OB Urine     Status: None   Collection Time: 03/11/15 11:45 AM  Result Value Ref Range   Specimen Description OB CLEAN CATCH    Special Requests Normal    Culture      MULTIPLE SPECIES PRESENT, SUGGEST RECOLLECTION NO GROUP B STREP (S.AGALACTIAE) ISOLATED Performed at [facility]    Report Status 03/13/2015 FINAL   Urinalysis, Routine w reflex microscopic (not at [facility])     Status: Abnormal   Collection Time: 03/11/15 11:47 AM  Result Value Ref Range   Color, Urine YELLOW YELLOW  APPearance CLEAR CLEAR    Specific Gravity, Urine 1.015 1.005 - 1.030   pH 6.5 5.0 - 8.0   Glucose, UA NEGATIVE NEGATIVE mg/dL   Hgb urine dipstick LARGE (A) NEGATIVE   Bilirubin Urine NEGATIVE NEGATIVE   Ketones, ur NEGATIVE NEGATIVE mg/dL   Protein, ur NEGATIVE NEGATIVE mg/dL   Nitrite NEGATIVE NEGATIVE   Leukocytes, UA TRACE (A) NEGATIVE  Urine microscopic-add on     Status: Abnormal   Collection Time: 03/11/15 11:47 AM  Result Value Ref Range   Squamous Epithelial / LPF 0-5 (A) NONE SEEN   WBC, UA 0-5 0 - 5 WBC/hpf   RBC / HPF 6-30 0 - 5 RBC/hpf   Bacteria, UA RARE (A) NONE SEEN  Differential     Status: None   Collection Time: 03/11/15 11:49 AM  Result Value Ref Range   Neutrophils Relative % 63 %   Neutro Abs 5.1 1.7 - 7.7 K/uL   Lymphocytes Relative 25 %   Lymphs Abs 2.1 0.7 - 4.0 K/uL   Monocytes Relative 8 %   Monocytes Absolute 0.7 0.1 - 1.0 K/uL   Eosinophils Relative 4 %   Eosinophils Absolute 0.4 0.0 - 0.7 K/uL   Basophils Relative 0 %   Basophils Absolute 0.0 0.0 - 0.1 K/uL  Type and screen [place]     Status: None   Collection Time: 03/11/15 11:49 AM  Result Value Ref Range   ABO/RH(D) O POS    Antibody Screen NEG    Sample Expiration 03/14/2015   hCG, quantitative, pregnancy     Status: Abnormal   Collection Time: 03/17/15  2:00 PM  Result Value Ref Range   hCG, Beta Chain, Quant, S 14.4 (H) mIU/mL    Comment:   Gestational Age    Expected hCG values (mIU/mL) <1 Week:                  5-50 1-2 Weeks:                50-500 2-3 Weeks:                [phone] 3-4 Weeks:                500-10000 4-5 Weeks:                1000-50000 5-6 Weeks:                10000-100000 6-8 Weeks:                15000-200000 2-3 Months:               10000-100000 The table above provides only a very rough estimate of gestational age and should be used only in conjunction with other methods for establishing gestational age. Much more reliable and accurate estimations of  gestational age may be obtained by using LMP or ultrasound.   Values from different assay methods may vary. The use of this assay to monitor or to diagnose patients with cancer or any other condition unrelated to pregnancy has not been cleared or approved by the FDA or the manufacturer of the assay.   Women with hCG values between 5 and 25 mIU/mL should have the result confirmed by repeat analysis in 2 to 4 days if clinical ly indicated.   Urinalysis, Routine w reflex microscopic (not at [facility])     Status: Abnormal   Collection Time: 03/20/15  1:04 AM  Result Value Ref Range   Color, Urine YELLOW YELLOW   APPearance CLEAR CLEAR   Specific Gravity, Urine 1.025 1.005 - 1.030   pH 5.5 5.0 - 8.0   Glucose, UA NEGATIVE NEGATIVE mg/dL   Hgb urine dipstick TRACE (A) NEGATIVE   Bilirubin Urine NEGATIVE NEGATIVE   Ketones, ur NEGATIVE NEGATIVE mg/dL   Protein, ur NEGATIVE NEGATIVE mg/dL   Nitrite NEGATIVE NEGATIVE   Leukocytes, UA NEGATIVE NEGATIVE  Urine microscopic-add on     Status: Abnormal   Collection Time: 03/20/15  1:04 AM  Result Value Ref Range   Squamous Epithelial / LPF 0-5 (A) NONE SEEN   WBC, UA 0-5 0 - 5 WBC/hpf   RBC / HPF 0-5 0 - 5 RBC/hpf   Bacteria, UA RARE (A) NONE SEEN   Urine-Other MUCOUS PRESENT   CBC     Status: None   Collection Time: 03/20/15  1:10 AM  Result Value Ref Range   WBC 7.7 4.0 - 10.5 K/uL   RBC 4.16 3.87 - 5.11 MIL/uL   Hemoglobin 12.2 12.0 - 15.0 g/dL   HCT 36.9 36.0 - 46.0 %   MCV 88.7 78.0 - 100.0 fL   MCH 29.3 26.0 - 34.0 pg   MCHC 33.1 30.0 - 36.0 g/dL   RDW 12.7 11.5 - 15.5 %   Platelets 289 150 - 400 K/uL  hCG, quantitative, pregnancy     Status: Abnormal   Collection Time: 03/20/15  1:10 AM  Result Value Ref Range   hCG, Beta Chain, Quant, S 8 (H) <5 mIU/mL    Comment:          GEST. AGE      CONC.  (mIU/mL)   <=1 WEEK        5 - 50     2 WEEKS       50 - 500     3 WEEKS       100 - 10,000     4 WEEKS     1,000 - 30,000      5 WEEKS     3,500 - 115,000   6-8 WEEKS     12,000 - 270,000    12 WEEKS     15,000 - 220,000        FEMALE AND NON-PREGNANT FEMALE:     LESS THAN 5 mIU/mL   Comprehensive metabolic panel     Status: None   Collection Time: 03/20/15  1:10 AM  Result Value Ref Range   Sodium 137 135 - 145 mmol/L   Potassium 3.7 3.5 - 5.1 mmol/L   Chloride 102 101 - 111 mmol/L   CO2 28 22 - 32 mmol/L   Glucose, Bld 96 65 - 99 mg/dL   BUN 10 6 - 20 mg/dL   Creatinine, Ser 0.66 0.44 - 1.00 mg/dL   Calcium 9.5 8.9 - 10.3 mg/dL   Total Protein 8.1 6.5 - 8.1 g/dL   Albumin 4.5 3.5 - 5.0 g/dL   AST 24 15 - 41 U/L   ALT 17 14 - 54 U/L   Alkaline Phosphatase 53 38 - 126 U/L   Total Bilirubin 0.3 0.3 - 1.2 mg/dL   GFR calc non Af Amer >60 >60 mL/min   GFR calc Af Amer >60 >60 mL/min    Comment: (NOTE) The eGFR has been calculated using the CKD EPI equation. This calculation has not been validated in all clinical situations. eGFR's persistently <60 mL/min signify possible Chronic Kidney Disease.  Anion gap 7 5 - 15  Amylase     Status: None   Collection Time: 03/20/15  3:05 PM  Result Value Ref Range   Amylase 51 28 - 100 U/L  Lipase, blood     Status: None   Collection Time: 03/20/15  3:05 PM  Result Value Ref Range   Lipase 30 11 - 51 U/L     US Abdomen Complete  03/20/2015  CLINICAL DATA:  Abdominal pain EXAM: ULTRASOUND ABDOMEN COMPLETE COMPARISON:  None. FINDINGS: Gallbladder: No gallstones or wall thickening visualized. No sonographic Murphy sign noted. Common bile duct: Diameter: 2.3 mm in diameter within normal limits. Liver: No focal lesion identified. Within normal limits in parenchymal echogenicity. IVC: No abnormality visualized. Pancreas: Not visualized due to abundant bowel gas. Spleen: Spleen measures 5.6 cm in length. There is a complex cyst in the splenic hilum measures 5.5 x 5.5 cm. Some septation and layering material noted within cyst. This may represent a splenic cyst,  pancreatic tail cyst or renal cyst. Further correlation with enhanced CT or MRI is recommended. Right Kidney: Length: 10.6 cm in length. Echogenicity within normal limits. No mass or hydronephrosis visualized. Left Kidney: Length: 11.6 cm in length. Echogenicity within normal limits. No mass or hydronephrosis visualized. Abdominal aorta: No aneurysm visualized. Measures up to 1.7 cm in diameter. Other findings: None. IMPRESSION: 1. No gallstones are noted within gallbladder.  Normal CBD. 2. There is a complex cyst in splenic hilum measures 5.5 x 5.5 cm. May represent a splenic cyst, pancreatic tail cyst or left renal cyst. Further evaluation with enhanced CT or MRI is recommended. 3. No hydronephrosis.  No renal calculi. 4. No aortic aneurysm. Electronically Signed   By: Lahoma Crocker M.D.   On: 03/20/2015 14:09    Review of Systems  Constitutional: Negative for fever and chills.  Gastrointestinal: Positive for nausea and abdominal pain.   Physical Exam   Blood pressure 115/46, pulse 114, temperature 98.1 F (36.7 C), temperature source Oral, resp. rate 18, last menstrual period 01/18/2015, unknown if currently breastfeeding.  Physical Exam  Constitutional: She is oriented to person, place, and time. She appears well-developed and well-nourished. No distress.  HENT:  Head: Normocephalic.  Eyes: Pupils are equal, round, and reactive to light.  GI: There is tenderness in the epigastric area. There is guarding. There is no rigidity and no rebound.  Musculoskeletal: Normal range of motion.  Neurological: She is alert and oriented to person, place, and time.  Skin: Skin is warm. She is not diaphoretic.  Psychiatric: Her behavior is normal.    MAU Course  Procedures  none  MDM  Amylase, Lipase pending.  Gi cocktail given; no relief. Patient crying asking for pain medication. She currently rates her pain 9/10  Discussed patient with Dr. Nehemiah Settle; patient needs further imaging at this time. I  will transfer to [facility] for further assessment.   Assessment and Plan   A:  1. Abdominal pain in female   2. Bilateral upper abdominal pain   3. Splenic cyst    P:  Transfer to [facility] via [facility] NS IV Toradol 30 mg IV Dilaudid 1 mg IV    Lezlie Lye, NP 03/20/2015 2:58 PM

## 2015-03-20 NOTE — ED Notes (Signed)
Patient transported to CT 

## 2015-03-21 ENCOUNTER — Ambulatory Visit (HOSPITAL_COMMUNITY): Admission: RE | Admit: 2015-03-21 | Payer: Medicaid Other | Source: Ambulatory Visit

## 2015-03-24 ENCOUNTER — Other Ambulatory Visit: Payer: Medicaid Other

## 2015-03-29 ENCOUNTER — Other Ambulatory Visit: Payer: Self-pay | Admitting: Advanced Practice Midwife

## 2015-04-15 ENCOUNTER — Other Ambulatory Visit: Payer: Self-pay | Admitting: General Surgery

## 2015-05-06 ENCOUNTER — Encounter (HOSPITAL_COMMUNITY): Payer: Self-pay

## 2015-05-06 ENCOUNTER — Encounter (HOSPITAL_COMMUNITY)
Admission: RE | Admit: 2015-05-06 | Discharge: 2015-05-06 | Disposition: A | Discharge status: Home / Self Care | Payer: Medicaid Other | Source: Ambulatory Visit | Attending: General Surgery | Admitting: General Surgery

## 2015-05-06 DIAGNOSIS — Z01818 Encounter for other preprocedural examination: Secondary | ICD-10-CM | POA: Insufficient documentation

## 2015-05-06 DIAGNOSIS — Z01812 Encounter for preprocedural laboratory examination: Secondary | ICD-10-CM | POA: Insufficient documentation

## 2015-05-06 DIAGNOSIS — Z0183 Encounter for blood typing: Secondary | ICD-10-CM | POA: Insufficient documentation

## 2015-05-06 DIAGNOSIS — R9389 Abnormal findings on diagnostic imaging of other specified body structures: Secondary | ICD-10-CM

## 2015-05-06 DIAGNOSIS — R1902 Left upper quadrant abdominal swelling, mass and lump: Secondary | ICD-10-CM | POA: Insufficient documentation

## 2015-05-06 HISTORY — DX: Complete or unspecified spontaneous abortion without complication: O03.9

## 2015-05-06 HISTORY — DX: Reserved for concepts with insufficient information to code with codable children: IMO0002

## 2015-05-06 HISTORY — DX: Localized swelling, mass and lump, unspecified: R22.9

## 2015-05-06 HISTORY — DX: Personal history of (healed) traumatic fracture: Z87.81

## 2015-05-06 LAB — BASIC METABOLIC PANEL
ANION GAP: 6 (ref 5–15)
BUN: 9 mg/dL (ref 6–20)
CHLORIDE: 102 mmol/L (ref 101–111)
CO2: 29 mmol/L (ref 22–32)
Calcium: 9.4 mg/dL (ref 8.9–10.3)
Creatinine, Ser: 0.73 mg/dL (ref 0.44–1.00)
GFR calc Af Amer: >60 mL/min (ref >60)
GFR calc non Af Amer: >60 mL/min (ref >60)
GLUCOSE: 83 mg/dL (ref 65–99)
POTASSIUM: 4 mmol/L (ref 3.5–5.1)
Sodium: 137 mmol/L (ref 135–145)

## 2015-05-06 LAB — URINALYSIS, ROUTINE W REFLEX MICROSCOPIC
Bilirubin Urine: NEGATIVE
Glucose, UA: NEGATIVE mg/dL
HGB URINE DIPSTICK: NEGATIVE
KETONES UR: NEGATIVE mg/dL
LEUKOCYTES UA: NEGATIVE
Nitrite: NEGATIVE
PROTEIN: NEGATIVE mg/dL
Specific Gravity, Urine: 1.022 (ref 1.005–1.030)
pH: 5 (ref 5.0–8.0)

## 2015-05-06 LAB — CBC WITH DIFFERENTIAL/PLATELET
Basophils Absolute: 0 10*3/uL (ref 0.0–0.1)
Basophils Relative: 0 %
Eosinophils Absolute: 0.3 10*3/uL (ref 0.0–0.7)
Eosinophils Relative: 4 %
HEMATOCRIT: 38.7 % (ref 36.0–46.0)
HEMOGLOBIN: 12.6 g/dL (ref 12.0–15.0)
LYMPHS ABS: 2 10*3/uL (ref 0.7–4.0)
LYMPHS PCT: 34 %
MCH: 28.7 pg (ref 26.0–34.0)
MCHC: 32.6 g/dL (ref 30.0–36.0)
MCV: 88.2 fL (ref 78.0–100.0)
MONO ABS: 0.4 10*3/uL (ref 0.1–1.0)
MONOS PCT: 7 %
NEUTROS ABS: 3.4 10*3/uL (ref 1.7–7.7)
Neutrophils Relative %: 55 %
Platelets: 310 10*3/uL (ref 150–400)
RBC: 4.39 MIL/uL (ref 3.87–5.11)
RDW: 12.2 % (ref 11.5–15.5)
WBC: 6.1 10*3/uL (ref 4.0–10.5)

## 2015-05-06 LAB — HCG, SERUM, QUALITATIVE: PREG SERUM: NEGATIVE

## 2015-05-06 LAB — ABO/RH: ABO/RH(D): O POS

## 2015-05-06 LAB — PROTIME-INR
INR: 1.07 (ref 0.00–1.49)
Prothrombin Time: 14.1 seconds (ref 11.6–15.2)

## 2015-05-06 NOTE — Patient Instructions (Signed)
Darrelle Hendrich  [id number]   Your procedure is scheduled on: Thursday May 08, 2015  Report to [facility] Main  Entrance take [place]  elevators to 3rd floor to  [place] at 5:30 AM.  Call this number if you have problems the morning of surgery [phone]   Remember: ONLY 1 PERSON MAY GO WITH YOU TO SHORT STAY TO GET  READY MORNING OF [name].  Do not eat food or drink liquids :After Midnight.     Take these medicines the morning of surgery with A SIP OF WATER: Oxycodone if needed                                You may not have any metal on your body including hair pins and              piercings  Do not wear jewelry, make-up, lotions, powders or perfumes, deodorant             Do not wear nail polish.  Do not shave  48 hours prior to surgery.            Do not bring valuables to the hospital. [place].  Contacts, dentures or bridgework may not be worn into surgery.  Leave suitcase in the car. After surgery it may be brought to your room.   _____________________________________________________________________             [facility] - Preparing for Surgery Before surgery, you can play an important role.  Because skin is not sterile, your skin needs to be as free of germs as possible.  You can reduce the number of germs on your skin by washing with CHG (chlorahexidine gluconate) soap before surgery.  CHG is an antiseptic cleaner which kills germs and bonds with the skin to continue killing germs even after washing. Please DO NOT use if you have an allergy to CHG or antibacterial soaps.  If your skin becomes reddened/irritated stop using the CHG and inform your nurse when you arrive at Short Stay. Do not shave (including legs and underarms) for at least 48 hours prior to the first CHG shower.  You may shave your face/neck. Please follow these instructions carefully:  1.  Shower with CHG Soap the  night before surgery and the  morning of Surgery.  2.  If you choose to wash your hair, wash your hair first as usual with your  normal  shampoo.  3.  After you shampoo, rinse your hair and body thoroughly to remove the  shampoo.                           4.  Use CHG as you would any other liquid soap.  You can apply chg directly  to the skin and wash                       Gently with a scrungie or clean washcloth.  5.  Apply the CHG Soap to your body ONLY FROM THE NECK DOWN.   Do not use on face/ open  Wound or open sores. Avoid contact with eyes, ears mouth and genitals (private parts).                       Wash face,  Genitals (private parts) with your normal soap.             6.  Wash thoroughly, paying special attention to the area where your surgery  will be performed.  7.  Thoroughly rinse your body with warm water from the neck down.  8.  DO NOT shower/wash with your normal soap after using and rinsing off  the CHG Soap.                9.  Pat yourself dry with a clean towel.            10.  Wear clean pajamas.            11.  Place clean sheets on your bed the night of your first shower and do not  sleep with pets. Day of Surgery : Do not apply any lotions/deodorants the morning of surgery.  Please wear clean clothes to the hospital/surgery center.  FAILURE TO FOLLOW THESE INSTRUCTIONS MAY RESULT IN THE CANCELLATION OF YOUR SURGERY PATIENT SIGNATURE_________________________________  NURSE SIGNATURE__________________________________  ________________________________________________________________________

## 2015-05-07 NOTE — H&P (Signed)
Diana Torres [id number] 1:14 PM Location: [facility] Patient #: [id number] DOB: 1990/12/23 Single / Language: Cleophus Molt / Race: Black or African American Female   History of Present Illness Stark Klein MD; 04/15/2015 2:34 PM) Patient words: pancreatic mass.  The patient is a 25 year old female who presents with an abdominal mass. The patient is a 24 year old female who was referred by Dr. Rosendo Gros for consultation regarding a new left upper quadrant mass. The patient was diagnosed with a blighted ovum in the fall and miscarried. The patient eventually ended up with a CT scan because she continued to have significant abdominal pain. This demonstrated a mass in the left upper quadrant. The CT was not clear whether it was in the pancreas or the spleen. The patient has continued to have severe pain requiring pain medications and multiple times a week. She is not able to discern if there is any specific triggers to make the pain worse. She has not noticed any change with activity or positioning. There is also no difference and pain with specific foods. The patient has not ever had any gallbladder symptoms. The pain is always in the left upper quadrant to midline. She has associated back pain. She has never been diagnosed with pancreatitis. The patient's grandmother did have pancreatic cancer, so the patient and mother are quite concerned. She has not had any diarrhea or diabetes.  CT abd/pelvis FINDINGS: The lung bases are clear.  The liver, spleen, gallbladder, adrenal glands, kidneys, abdominal aorta, inferior vena cava, and retroperitoneal lymph nodes are unremarkable. The there is a circumscribed cystic collection in the left upper quadrant adjacent to the pancreatic tail. This measures about 5.7 x 4.5 cm and has a moderately thickened wall and internal septation. Etiology is indeterminate. The lesion abuts the spleen and tail of the pancreas and may represent a cystic  pancreatic lesion such as a mucinous tumor or it could represent an old pseudo cyst, or a splenic cyst or hemangioma. The lesion also abuts the anterior wall of the left kidney but appears separate from the kidney. Mesenteric cystic lesions could also have this appearance. The stomach, small bowel, and colon are not abnormally distended. No free air or free fluid in the abdomen. Abdominal wall musculature appears intact.  Pelvis: The appendix is normal. Uterus and ovaries are not enlarged. Small amount of free fluid in the pelvis is likely physiologic. No loculated fluid collections. Bladder wall is not thickened. No pelvic mass or lymphadenopathy. No evidence of diverticulitis. No destructive bone lesions.  IMPRESSION: 5.7 cm maximal diameter complex cystic mass in the left upper quadrant may arise from pancreas, spleen, or mesenteric origin. MRI may be useful in further characterization of this lesion.   Other Problems Jeralyn Ruths, CMA; 04/15/2015 1:14 PM) No pertinent past medical history  Past Surgical History Jeralyn Ruths, CMA; 04/15/2015 1:14 PM) Foot Surgery Right. Tonsillectomy  Diagnostic Studies History Jeralyn Ruths, [place]; 04/15/2015 1:14 PM) Colonoscopy never Mammogram never Pap Smear 1-5 years ago  Allergies Jeralyn Ruths, CMA; 04/15/2015 1:15 PM) Codeine Phosphate *ANALGESICS - OPIOID* Itching, Rash.  Medication History Jeralyn Ruths, [place]; 04/15/2015 1:15 PM) Medications Reconciled  Social History Jeralyn Ruths, CMA; 04/15/2015 1:14 PM) Alcohol use Remotely quit alcohol use. No drug use Tobacco use Never smoker.  Family History Jeralyn Ruths, [place]; 04/15/2015 1:14 PM) Arthritis Mother. Cancer Family Members In General. Heart disease in female family member before age 43 Ischemic Bowel Disease Family Members In General, Mother. Malignant Neoplasm Of Pancreas Family Members  In General.  Pregnancy / Birth History Jeralyn Ruths, [place];  04/15/2015 1:14 PM) Age at menarche 15 years. Gravida 2 Maternal age 47-25 Para 1 Regular periods    Review of Systems Jeralyn Ruths CMA; 04/15/2015 1:14 PM) General Present- Appetite Loss, Chills, Fatigue and Weight Loss. Not Present- Fever, Night Sweats and Weight Gain. Skin Not Present- Change in Wart/Mole, Dryness, Hives, Jaundice, New Lesions, Non-Healing Wounds, Rash and Ulcer. Respiratory Not Present- Bloody sputum, Chronic Cough, Difficulty Breathing, Snoring and Wheezing. Breast Not Present- Breast Mass, Breast Pain, Nipple Discharge and Skin Changes. Gastrointestinal Present- Abdominal Pain, Change in Bowel Habits, Constipation, Gets full quickly at meals and Nausea. Not Present- Bloating, Bloody Stool, Chronic diarrhea, Difficulty Swallowing, Excessive gas, Hemorrhoids, Indigestion, Rectal Pain and Vomiting. Female Genitourinary Not Present- Frequency, Nocturia, Painful Urination, Pelvic Pain and Urgency. Musculoskeletal Not Present- Back Pain, Joint Pain, Joint Stiffness, Muscle Pain, Muscle Weakness and Swelling of Extremities. Endocrine Present- Cold Intolerance. Not Present- Excessive Hunger, Hair Changes, Heat Intolerance, Hot flashes and New Diabetes. Hematology Not Present- Easy Bruising, Excessive bleeding, Gland problems, HIV and Persistent Infections.  Vitals Jearld Fenton Morris CMA; 04/15/2015 1:18 PM) 04/15/2015 1:16 PM Weight: 158 lb Height: 65.5in Body Surface Area: 1.8 m Body Mass Index: 25.89 kg/m  Temp.: 98.44F(Oral)  Pulse: 80 (Regular)  BP: 112/70 (Sitting, Left Arm, Standard)       Physical Exam Stark Klein MD; 04/15/2015 2:34 PM) General Mental Status-Alert. General Appearance-Consistent with stated age. Hydration-Well hydrated. Voice-Normal.  Head and Neck Head-normocephalic, atraumatic with no lesions or palpable masses. Trachea-midline. Thyroid Gland Characteristics - normal size and consistency.  Eye Eyeball -  Bilateral-Extraocular movements intact. Sclera/Conjunctiva - Bilateral-No scleral icterus.  Chest and Lung Exam Chest and lung exam reveals -quiet, even and easy respiratory effort with no use of accessory muscles and on auscultation, normal breath sounds, no adventitious sounds and normal vocal resonance. Inspection Chest Wall - Normal. Back - normal.  Cardiovascular Cardiovascular examination reveals -normal heart sounds, regular rate and rhythm with no murmurs and normal pedal pulses bilaterally.  Abdomen Inspection Inspection of the abdomen reveals - No Hernias. Palpation/Percussion Palpation and Percussion of the abdomen reveal - Soft, Non Tender, No Rebound tenderness, No Rigidity (guarding) and No hepatosplenomegaly. Auscultation Auscultation of the abdomen reveals - Bowel sounds normal. Note: tender in the LUQ and epigastrium. Fullness in the LUQ.   Neurologic Neurologic evaluation reveals -alert and oriented x 3 with no impairment of recent or remote memory. Mental Status-Normal.  Musculoskeletal Global Assessment -Note: no gross deformities.  Normal Exam - Left-Upper Extremity Strength Normal and Lower Extremity Strength Normal. Normal Exam - Right-Upper Extremity Strength Normal and Lower Extremity Strength Normal.  Lymphatic Head & Neck  General Head & Neck Lymphatics: Bilateral - Description - Normal. Axillary  General Axillary Region: Bilateral - Description - Normal. Tenderness - Non Tender. Femoral & Inguinal  Generalized Femoral & Inguinal Lymphatics: Bilateral - Description - No Generalized lymphadenopathy.    Assessment & Plan Stark Klein MD; 04/15/2015 2:38 PM) ABDOMINAL MASS, LUQ (LEFT UPPER QUADRANT) (R19.02) Impression: I am not sure that if the patient has a splenic mass or a distal pancreas mass. Much less common, but possible would be a lymphangioma or a duplication cyst. Normally, I would get an MRI or an endoscopic  ultrasound, but this would not change my recommendation to take out the mass. It is a complex cyst and the patient is extremely symptomatic. Even if it were a benign serous cystadenoma, it would still be  indicated to remove. History of pancreatitis, I think that this is extremely unlikely to be a pseudocyst. It also has been there for a while and her symptoms are becoming worse. There is a possibility that she may have endometriosis. However all of these are less common.  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  We will set this up for robotic versus laparoscopic cyst removal. I discussed that we would either remove the tail of the pancreas or the spleen or whatever else the mass is done. I discussed that sometimes the spleen has to come out with the pancreas. I reviewed that there would be a drain. I discussed postoperative restrictions as well as hospital time. I discussed the criteria that would be used for discharge from the hospital.  I reviewed the risks of bleeding, infection, damage to adjacent structures, blood clot, pneumonia, risk of blood transfusion, risk of additional procedures, risk of pancreatic leak. Patient is hearing impaired and we discussed that there would be an interpreter preoperatively as well as in the recovery room.  The patient would like to have surgery done as soon as possible due to her symptoms. We recommended for school stating that she would be having surgery. Also, she was given a refill of her oxycodone, #40. Current Plans You are being scheduled for surgery - Our schedulers will call you.  You should hear from our office's scheduling department within 5 working days about the location, date, and time of surgery. We try to make accommodations for patient's preferences in scheduling surgery, but sometimes the OR schedule or the surgeon's schedule prevents [place] from making those accommodations.  If you have not heard from our office  [phone]) in 5 working days, call the office and ask for your surgeon's nurse.  If you have other questions about your diagnosis, plan, or surgery, call the office and ask for your surgeon's nurse.  Pt Education - FB pancreatectomy   Signed by Stark Klein, MD (04/15/2015 2:38 PM)

## 2015-05-08 ENCOUNTER — Encounter (HOSPITAL_COMMUNITY): Payer: Self-pay | Admitting: Anesthesiology

## 2015-05-08 ENCOUNTER — Inpatient Hospital Stay (HOSPITAL_COMMUNITY): Payer: Medicaid Other | Admitting: Anesthesiology

## 2015-05-08 ENCOUNTER — Inpatient Hospital Stay (HOSPITAL_COMMUNITY)
Admission: RE | Admit: 2015-05-08 | Discharge: 2015-05-13 | DRG: 406 | Disposition: A | Discharge status: Home / Self Care | Payer: Medicaid Other | Source: Ambulatory Visit | Attending: General Surgery | Admitting: General Surgery

## 2015-05-08 ENCOUNTER — Encounter (HOSPITAL_COMMUNITY)
Admission: RE | Disposition: A | Discharge status: Home / Self Care | Payer: Self-pay | Source: Ambulatory Visit | Attending: General Surgery

## 2015-05-08 DIAGNOSIS — N390 Urinary tract infection, site not specified: Secondary | ICD-10-CM | POA: Diagnosis not present

## 2015-05-08 DIAGNOSIS — H9193 Unspecified hearing loss, bilateral: Secondary | ICD-10-CM | POA: Diagnosis present

## 2015-05-08 DIAGNOSIS — K567 Ileus, unspecified: Secondary | ICD-10-CM | POA: Diagnosis not present

## 2015-05-08 DIAGNOSIS — K869 Disease of pancreas, unspecified: Secondary | ICD-10-CM | POA: Diagnosis present

## 2015-05-08 DIAGNOSIS — Z01812 Encounter for preprocedural laboratory examination: Secondary | ICD-10-CM

## 2015-05-08 DIAGNOSIS — D136 Benign neoplasm of pancreas: Principal | ICD-10-CM | POA: Diagnosis present

## 2015-05-08 DIAGNOSIS — K8689 Other specified diseases of pancreas: Secondary | ICD-10-CM | POA: Diagnosis present

## 2015-05-08 HISTORY — DX: Other specified postprocedural states: R11.2

## 2015-05-08 HISTORY — DX: Other specified postprocedural states: Z98.890

## 2015-05-08 HISTORY — PX: LAPAROSCOPIC SPLENECTOMY: SHX409

## 2015-05-08 LAB — TYPE AND SCREEN
ABO/RH(D): O POS
Antibody Screen: NEGATIVE

## 2015-05-08 SURGERY — SPLENECTOMY, LAPAROSCOPIC
Anesthesia: General | Site: Abdomen

## 2015-05-08 MED ORDER — CEFAZOLIN SODIUM-DEXTROSE 2-3 GM-% IV SOLR
2.0000 g | INTRAVENOUS | Status: AC
  Administered 2015-05-08: 2 g via INTRAVENOUS

## 2015-05-08 MED ORDER — SIMETHICONE 80 MG PO CHEW
40.0000 mg | CHEWABLE_TABLET | Freq: Four times a day (QID) | ORAL | Status: DC | PRN
  Filled 2015-05-08: qty 1

## 2015-05-08 MED ORDER — DIPHENHYDRAMINE HCL 25 MG PO CAPS
25.0000 mg | ORAL_CAPSULE | Freq: Four times a day (QID) | ORAL | Status: DC | PRN
  Administered 2015-05-09 – 2015-05-11 (×3): 25 mg via ORAL
  Filled 2015-05-08 (×4): qty 1

## 2015-05-08 MED ORDER — HYDROMORPHONE HCL 1 MG/ML IJ SOLN
INTRAMUSCULAR | Status: DC | PRN
  Administered 2015-05-08 (×2): 0.5 mg via INTRAVENOUS
  Administered 2015-05-08: 1 mg via INTRAVENOUS

## 2015-05-08 MED ORDER — LIP MEDEX EX OINT
TOPICAL_OINTMENT | CUTANEOUS | Status: AC
  Administered 2015-05-08: 14:00:00
  Filled 2015-05-08: qty 7

## 2015-05-08 MED ORDER — DIPHENHYDRAMINE HCL 50 MG/ML IJ SOLN: 25.0000 mg | Freq: Four times a day (QID) | INTRAMUSCULAR | Status: DC | PRN

## 2015-05-08 MED ORDER — MEPERIDINE HCL 50 MG/ML IJ SOLN
INTRAMUSCULAR | Status: AC
  Filled 2015-05-08: qty 1

## 2015-05-08 MED ORDER — CETYLPYRIDINIUM CHLORIDE 0.05 % MT LIQD
7.0000 mL | Freq: Two times a day (BID) | OROMUCOSAL | Status: DC
  Administered 2015-05-10 – 2015-05-12 (×5): 7 mL via OROMUCOSAL

## 2015-05-08 MED ORDER — OXYCODONE HCL 5 MG PO TABS
5.0000 mg | ORAL_TABLET | Freq: Four times a day (QID) | ORAL | Status: DC | PRN
  Administered 2015-05-08 – 2015-05-09 (×3): 10 mg via ORAL
  Filled 2015-05-08 (×3): qty 2

## 2015-05-08 MED ORDER — PROPOFOL 10 MG/ML IV BOLUS
INTRAVENOUS | Status: AC
  Filled 2015-05-08: qty 20

## 2015-05-08 MED ORDER — ONDANSETRON 4 MG PO TBDP
4.0000 mg | ORAL_TABLET | Freq: Four times a day (QID) | ORAL | Status: DC | PRN
  Filled 2015-05-08: qty 1

## 2015-05-08 MED ORDER — ROCURONIUM BROMIDE 100 MG/10ML IV SOLN
INTRAVENOUS | Status: AC
  Filled 2015-05-08: qty 1

## 2015-05-08 MED ORDER — ONDANSETRON HCL 4 MG/2ML IJ SOLN
4.0000 mg | Freq: Four times a day (QID) | INTRAMUSCULAR | Status: DC | PRN
Start: 2015-05-08 — End: 2015-05-13
  Administered 2015-05-08 – 2015-05-13 (×6): 4 mg via INTRAVENOUS
  Filled 2015-05-08 (×6): qty 2

## 2015-05-08 MED ORDER — DOCUSATE SODIUM 100 MG PO CAPS: 100.0000 mg | ORAL_CAPSULE | Freq: Two times a day (BID) | ORAL | Status: DC | PRN

## 2015-05-08 MED ORDER — BUPIVACAINE 0.25 % ON-Q PUMP DUAL CATH 300 ML
300.0000 mL | INJECTION | Status: DC
  Administered 2015-05-08: 300 mL
  Filled 2015-05-08: qty 300

## 2015-05-08 MED ORDER — KETOROLAC TROMETHAMINE 30 MG/ML IJ SOLN
30.0000 mg | Freq: Four times a day (QID) | INTRAMUSCULAR | Status: AC
  Administered 2015-05-08 – 2015-05-10 (×8): 30 mg via INTRAVENOUS
  Filled 2015-05-08 (×12): qty 1

## 2015-05-08 MED ORDER — SCOPOLAMINE 1 MG/3DAYS TD PT72
MEDICATED_PATCH | TRANSDERMAL | Status: DC | PRN
  Administered 2015-05-08: 1 via TRANSDERMAL

## 2015-05-08 MED ORDER — ONDANSETRON HCL 4 MG/2ML IJ SOLN
INTRAMUSCULAR | Status: AC
  Filled 2015-05-08: qty 2

## 2015-05-08 MED ORDER — PROMETHAZINE HCL 25 MG/ML IJ SOLN: 6.2500 mg | INTRAMUSCULAR | Status: DC | PRN

## 2015-05-08 MED ORDER — PROPOFOL 10 MG/ML IV BOLUS
INTRAVENOUS | Status: DC | PRN
  Administered 2015-05-08: 120 mg via INTRAVENOUS

## 2015-05-08 MED ORDER — ACETAMINOPHEN 650 MG RE SUPP: 650.0000 mg | Freq: Four times a day (QID) | RECTAL | Status: DC | PRN

## 2015-05-08 MED ORDER — SCOPOLAMINE 1 MG/3DAYS TD PT72
MEDICATED_PATCH | TRANSDERMAL | Status: AC
  Filled 2015-05-08: qty 1

## 2015-05-08 MED ORDER — OXYCODONE HCL 5 MG PO TABS: 5.0000 mg | ORAL_TABLET | Freq: Once | ORAL | Status: DC | PRN

## 2015-05-08 MED ORDER — CEFAZOLIN SODIUM-DEXTROSE 2-3 GM-% IV SOLR
2.0000 g | Freq: Three times a day (TID) | INTRAVENOUS | Status: AC
  Administered 2015-05-08: 2 g via INTRAVENOUS
  Filled 2015-05-08: qty 50

## 2015-05-08 MED ORDER — DEXAMETHASONE SODIUM PHOSPHATE 10 MG/ML IJ SOLN
INTRAMUSCULAR | Status: DC | PRN
  Administered 2015-05-08: 10 mg via INTRAVENOUS

## 2015-05-08 MED ORDER — MIDAZOLAM HCL 2 MG/2ML IJ SOLN
INTRAMUSCULAR | Status: AC
  Filled 2015-05-08: qty 2

## 2015-05-08 MED ORDER — HYDROMORPHONE HCL 2 MG/ML IJ SOLN
INTRAMUSCULAR | Status: AC
  Filled 2015-05-08: qty 1

## 2015-05-08 MED ORDER — ONDANSETRON HCL 4 MG/2ML IJ SOLN
INTRAMUSCULAR | Status: DC | PRN
  Administered 2015-05-08: 4 mg via INTRAVENOUS

## 2015-05-08 MED ORDER — ROCURONIUM BROMIDE 100 MG/10ML IV SOLN
INTRAVENOUS | Status: DC | PRN
  Administered 2015-05-08: 40 mg via INTRAVENOUS
  Administered 2015-05-08: 10 mg via INTRAVENOUS
  Administered 2015-05-08: 20 mg via INTRAVENOUS

## 2015-05-08 MED ORDER — LACTATED RINGERS IV SOLN
INTRAVENOUS | Status: DC | PRN
  Administered 2015-05-08 (×2): via INTRAVENOUS

## 2015-05-08 MED ORDER — PANTOPRAZOLE SODIUM 40 MG IV SOLR
40.0000 mg | Freq: Every day | INTRAVENOUS | Status: DC
  Administered 2015-05-08 – 2015-05-10 (×3): 40 mg via INTRAVENOUS
  Filled 2015-05-08 (×4): qty 40

## 2015-05-08 MED ORDER — KCL IN DEXTROSE-NACL 20-5-0.45 MEQ/L-%-% IV SOLN
INTRAVENOUS | Status: DC
  Administered 2015-05-08 – 2015-05-13 (×7): via INTRAVENOUS
  Filled 2015-05-08 (×9): qty 1000

## 2015-05-08 MED ORDER — NEOSTIGMINE METHYLSULFATE 10 MG/10ML IV SOLN
INTRAVENOUS | Status: DC | PRN
Start: 2015-05-08 — End: 2015-05-08
  Administered 2015-05-08: 3.5 mg via INTRAVENOUS

## 2015-05-08 MED ORDER — FENTANYL CITRATE (PF) 250 MCG/5ML IJ SOLN
INTRAMUSCULAR | Status: AC
  Filled 2015-05-08: qty 5

## 2015-05-08 MED ORDER — SODIUM CHLORIDE 0.9 % IJ SOLN
INTRAMUSCULAR | Status: AC
  Filled 2015-05-08: qty 10

## 2015-05-08 MED ORDER — HYDROMORPHONE HCL 1 MG/ML IJ SOLN
0.2500 mg | INTRAMUSCULAR | Status: DC | PRN
  Administered 2015-05-08 (×4): 0.5 mg via INTRAVENOUS

## 2015-05-08 MED ORDER — LACTATED RINGERS IR SOLN
Status: DC | PRN
  Administered 2015-05-08: 1000 mL

## 2015-05-08 MED ORDER — OXYCODONE HCL 5 MG/5ML PO SOLN: 5.0000 mg | Freq: Once | ORAL | Status: DC | PRN

## 2015-05-08 MED ORDER — LIDOCAINE HCL (CARDIAC) 20 MG/ML IV SOLN
INTRAVENOUS | Status: DC | PRN
  Administered 2015-05-08: 50 mg via INTRAVENOUS

## 2015-05-08 MED ORDER — LIDOCAINE HCL (CARDIAC) 20 MG/ML IV SOLN
INTRAVENOUS | Status: AC
  Filled 2015-05-08: qty 5

## 2015-05-08 MED ORDER — CEFAZOLIN SODIUM-DEXTROSE 2-3 GM-% IV SOLR
INTRAVENOUS | Status: AC
  Filled 2015-05-08: qty 50

## 2015-05-08 MED ORDER — ACETAMINOPHEN 325 MG PO TABS: 650.0000 mg | ORAL_TABLET | Freq: Four times a day (QID) | ORAL | Status: DC | PRN

## 2015-05-08 MED ORDER — GLYCOPYRROLATE 0.2 MG/ML IJ SOLN
INTRAMUSCULAR | Status: DC | PRN
  Administered 2015-05-08: .6 mg via INTRAVENOUS

## 2015-05-08 MED ORDER — HYDROMORPHONE HCL 1 MG/ML IJ SOLN
INTRAMUSCULAR | Status: AC
  Filled 2015-05-08: qty 1

## 2015-05-08 MED ORDER — MIDAZOLAM HCL 5 MG/5ML IJ SOLN
INTRAMUSCULAR | Status: DC | PRN
  Administered 2015-05-08: 2 mg via INTRAVENOUS

## 2015-05-08 MED ORDER — ACETAMINOPHEN 500 MG PO TABS
1000.0000 mg | ORAL_TABLET | Freq: Four times a day (QID) | ORAL | Status: DC
  Administered 2015-05-08 – 2015-05-13 (×20): 1000 mg via ORAL
  Filled 2015-05-08 (×32): qty 2

## 2015-05-08 MED ORDER — MEPERIDINE HCL 50 MG/ML IJ SOLN
6.2500 mg | INTRAMUSCULAR | Status: DC | PRN
  Administered 2015-05-08: 12.5 mg via INTRAVENOUS

## 2015-05-08 MED ORDER — PROMETHAZINE HCL 25 MG/ML IJ SOLN
INTRAMUSCULAR | Status: AC
  Filled 2015-05-08: qty 1

## 2015-05-08 MED ORDER — 0.9 % SODIUM CHLORIDE (POUR BTL) OPTIME
TOPICAL | Status: DC | PRN
  Administered 2015-05-08: 1000 mL

## 2015-05-08 MED ORDER — METHOCARBAMOL 500 MG PO TABS
500.0000 mg | ORAL_TABLET | Freq: Four times a day (QID) | ORAL | Status: DC | PRN
  Administered 2015-05-09: 500 mg via ORAL
  Filled 2015-05-08 (×2): qty 1

## 2015-05-08 MED ORDER — LIDOCAINE HCL 1 % IJ SOLN
INTRAMUSCULAR | Status: AC
  Filled 2015-05-08: qty 20

## 2015-05-08 MED ORDER — LIDOCAINE HCL (PF) 1 % IJ SOLN
INTRAMUSCULAR | Status: DC | PRN
  Administered 2015-05-08: 10 mL

## 2015-05-08 MED ORDER — ONDANSETRON 4 MG PO TBDP
4.0000 mg | ORAL_TABLET | Freq: Four times a day (QID) | ORAL | Status: DC | PRN
  Administered 2015-05-13: 4 mg via ORAL

## 2015-05-08 MED ORDER — HYDROMORPHONE HCL 1 MG/ML IJ SOLN
0.5000 mg | INTRAMUSCULAR | Status: DC | PRN
  Administered 2015-05-08 – 2015-05-12 (×25): 1 mg via INTRAVENOUS
  Administered 2015-05-12: 2 mg via INTRAVENOUS
  Administered 2015-05-12 – 2015-05-13 (×3): 1 mg via INTRAVENOUS
  Filled 2015-05-08: qty 1
  Filled 2015-05-08: qty 2
  Filled 2015-05-08 (×28): qty 1

## 2015-05-08 MED ORDER — BUPIVACAINE-EPINEPHRINE 0.25% -1:200000 IJ SOLN
INTRAMUSCULAR | Status: DC | PRN
  Administered 2015-05-08: 10 mL

## 2015-05-08 MED ORDER — KETOROLAC TROMETHAMINE 30 MG/ML IJ SOLN
30.0000 mg | Freq: Four times a day (QID) | INTRAMUSCULAR | Status: DC | PRN
  Administered 2015-05-10 – 2015-05-13 (×2): 30 mg via INTRAVENOUS
  Filled 2015-05-08 (×3): qty 1

## 2015-05-08 MED ORDER — DEXAMETHASONE SODIUM PHOSPHATE 10 MG/ML IJ SOLN
INTRAMUSCULAR | Status: AC
  Filled 2015-05-08: qty 1

## 2015-05-08 MED ORDER — FENTANYL CITRATE (PF) 100 MCG/2ML IJ SOLN
INTRAMUSCULAR | Status: DC | PRN
  Administered 2015-05-08: 50 ug via INTRAVENOUS
  Administered 2015-05-08: 100 ug via INTRAVENOUS
  Administered 2015-05-08 (×7): 50 ug via INTRAVENOUS

## 2015-05-08 MED ORDER — BUPIVACAINE ON-Q PAIN PUMP (FOR ORDER SET NO CHG)
INJECTION | Status: DC
  Filled 2015-05-08: qty 1

## 2015-05-08 MED ORDER — BUPIVACAINE-EPINEPHRINE (PF) 0.25% -1:200000 IJ SOLN
INTRAMUSCULAR | Status: AC
  Filled 2015-05-08: qty 30

## 2015-05-08 SURGICAL SUPPLY — 109 items
APPLIER CLIP 5 13 M/L LIGAMAX5 (MISCELLANEOUS)
APPLIER CLIP ROT 10 11.4 M/L (STAPLE)
BLADE EXTENDED COATED 6.5IN (ELECTRODE) ×3 IMPLANT
BLADE HEX COATED 2.75 (ELECTRODE) ×3 IMPLANT
BLADE SURG SZ10 CARB STEEL (BLADE) ×3 IMPLANT
CATH KIT ON-Q SILVERSOAK 5IN (CATHETERS) ×6 IMPLANT
CHLORAPREP W/TINT 26ML (MISCELLANEOUS) ×3 IMPLANT
CLIP APPLIE 5 13 M/L LIGAMAX5 (MISCELLANEOUS) IMPLANT
CLIP APPLIE ROT 10 11.4 M/L (STAPLE) IMPLANT
CLIP LIGATING HEM O LOK PURPLE (MISCELLANEOUS) ×3 IMPLANT
CLIP LIGATING HEMO O LOK GREEN (MISCELLANEOUS) ×6 IMPLANT
CLIP TI LARGE 6 (CLIP) IMPLANT
COVER SURGICAL LIGHT HANDLE (MISCELLANEOUS) ×3 IMPLANT
CUTTER FLEX LINEAR 45M (STAPLE) IMPLANT
DERMABOND ADVANCED (GAUZE/BANDAGES/DRESSINGS) ×2
DERMABOND ADVANCED .7 DNX12 (GAUZE/BANDAGES/DRESSINGS) ×1 IMPLANT
DISSECTOR ROUND CHERRY 3/8 STR (MISCELLANEOUS) IMPLANT
DRAIN CHANNEL RND F F (WOUND CARE) IMPLANT
DRAPE C-ARM 42X120 X-RAY (DRAPES) IMPLANT
DRAPE SHEET LG 3/4 BI-LAMINATE (DRAPES) IMPLANT
DRAPE WARM FLUID 44X44 (DRAPE) ×3 IMPLANT
DRESSING TELFA ISLAND 4X8 (GAUZE/BANDAGES/DRESSINGS) IMPLANT
DRSG PAD ABDOMINAL 8X10 ST (GAUZE/BANDAGES/DRESSINGS) IMPLANT
DRSG TELFA 4X10 ISLAND STR (GAUZE/BANDAGES/DRESSINGS) IMPLANT
DRSG TELFA PLUS 4X6 ADH ISLAND (GAUZE/BANDAGES/DRESSINGS) IMPLANT
ELECT PENCIL ROCKER SW 15FT (MISCELLANEOUS) ×3 IMPLANT
ENDOLOOP SUT PDS II  0 18 (SUTURE)
ENDOLOOP SUT PDS II 0 18 (SUTURE) IMPLANT
EVACUATOR SILICONE 100CC (DRAIN) IMPLANT
GAUZE SPONGE 4X4 12PLY STRL (GAUZE/BANDAGES/DRESSINGS) IMPLANT
GAUZE SPONGE 4X4 16PLY XRAY LF (GAUZE/BANDAGES/DRESSINGS) IMPLANT
GLOVE BIO SURGEON STRL SZ 6 (GLOVE) ×6 IMPLANT
GLOVE INDICATOR 6.5 STRL GRN (GLOVE) ×6 IMPLANT
GOWN STRL REUS TWL 2XL XL LVL4 (GOWN DISPOSABLE) ×3 IMPLANT
GOWN STRL REUS W/ TWL XL LVL3 (GOWN DISPOSABLE) ×3 IMPLANT
GOWN STRL REUS W/TWL XL LVL3 (GOWN DISPOSABLE) ×6
HANDLE SUCTION POOLE (INSTRUMENTS) IMPLANT
HEMOSTAT SURGICEL 4X8 (HEMOSTASIS) IMPLANT
KIT BASIN OR (CUSTOM PROCEDURE TRAY) ×3 IMPLANT
LOOP MINI RED (MISCELLANEOUS) IMPLANT
LOOP VESSEL MAXI BLUE (MISCELLANEOUS) ×3 IMPLANT
LUBRICANT JELLY K Y 4OZ (MISCELLANEOUS) IMPLANT
NS IRRIG 1000ML POUR BTL (IV SOLUTION) ×6 IMPLANT
PACK UNIVERSAL I (CUSTOM PROCEDURE TRAY) ×3 IMPLANT
PLUG CATH AND CAP STER (CATHETERS) IMPLANT
POUCH ENDO CATCH II 15MM (MISCELLANEOUS) IMPLANT
RELOAD 45 VASCULAR/THIN (ENDOMECHANICALS) IMPLANT
RELOAD STAPLE TA45 3.5 REG BLU (ENDOMECHANICALS) IMPLANT
RELOAD STAPLER BLUE 60MM (STAPLE) IMPLANT
RELOAD STAPLER GOLD 60MM (STAPLE) IMPLANT
RELOAD STAPLER WHITE 60MM (STAPLE) ×2 IMPLANT
SET IRRIG TUBING LAPAROSCOPIC (IRRIGATION / IRRIGATOR) IMPLANT
SET TUBE IRRIG SUCTION NO TIP (IRRIGATION / IRRIGATOR) ×3 IMPLANT
SHEARS FOC LG CVD HARMONIC 17C (MISCELLANEOUS) IMPLANT
SHEARS HARMONIC ACE PLUS 36CM (ENDOMECHANICALS) IMPLANT
SLEEVE SURGEON STRL (DRAPES) IMPLANT
SLEEVE XCEL OPT CAN 5 100 (ENDOMECHANICALS) ×3 IMPLANT
SPONGE LAP 18X18 X RAY DECT (DISPOSABLE) ×3 IMPLANT
SPONGE SURGIFOAM ABS GEL 100 (HEMOSTASIS) IMPLANT
STAPLE ECHEON FLEX 60 POW ENDO (STAPLE) IMPLANT
STAPLER PROXIMATE 75MM BLUE (STAPLE) IMPLANT
STAPLER RELOAD BLUE 60MM (STAPLE)
STAPLER RELOAD GOLD 60MM (STAPLE)
STAPLER RELOAD WHITE 60MM (STAPLE) ×6
STAPLER VISISTAT 35W (STAPLE) ×3 IMPLANT
SUCTION POOLE HANDLE (INSTRUMENTS)
SUT CHROMIC 3 0 SH 27 (SUTURE) IMPLANT
SUT CHROMIC 4 0 RB 1X27 (SUTURE) IMPLANT
SUT ETHILON 2 0 PS N (SUTURE) IMPLANT
SUT MNCRL AB 4-0 PS2 18 (SUTURE) ×6 IMPLANT
SUT PDS AB 1 TP1 54 (SUTURE) IMPLANT
SUT PROLENE 3 0 SH 48 (SUTURE) IMPLANT
SUT PROLENE 3 0 SH1 36 (SUTURE) IMPLANT
SUT PROLENE 4 0 RB 1 (SUTURE)
SUT PROLENE 4-0 RB1 .5 CRCL 36 (SUTURE) IMPLANT
SUT PROLENE 5 0 CC 1 (SUTURE) IMPLANT
SUT SILK 0 FSL (SUTURE) ×3 IMPLANT
SUT SILK 2 0 (SUTURE) ×2
SUT SILK 2 0 SH CR/8 (SUTURE) ×3 IMPLANT
SUT SILK 2-0 18XBRD TIE 12 (SUTURE) ×1 IMPLANT
SUT SILK 3 0 (SUTURE)
SUT SILK 3 0 SH CR/8 (SUTURE) IMPLANT
SUT SILK 3-0 18XBRD TIE 12 (SUTURE) IMPLANT
SUT VIC AB 3-0 SH 18 (SUTURE) IMPLANT
SUT VIC AB 4-0 SH 18 (SUTURE) IMPLANT
SUT VICRYL 0 ENDOLOOP (SUTURE) IMPLANT
SUT VICRYL 2 0 18  UND BR (SUTURE) ×2
SUT VICRYL 2 0 18 UND BR (SUTURE) ×1 IMPLANT
SUT VICRYL 3 0 BR 18  UND (SUTURE) ×2
SUT VICRYL 3 0 BR 18 UND (SUTURE) ×1 IMPLANT
SYR BULB IRRIGATION 50ML (SYRINGE) ×3 IMPLANT
SYRINGE 10CC LL (SYRINGE) IMPLANT
SYS LAPSCP GELPORT 120MM (MISCELLANEOUS)
SYSTEM LAPSCP GELPORT 120MM (MISCELLANEOUS) IMPLANT
TAPE UMBILICAL COTTON 1/8X30 (MISCELLANEOUS) ×3 IMPLANT
TOWEL BLUE STERILE X RAY DET (MISCELLANEOUS) IMPLANT
TOWEL OR 17X26 10 PK STRL BLUE (TOWEL DISPOSABLE) ×3 IMPLANT
TRAY FOLEY W/METER SILVER 14FR (SET/KITS/TRAYS/PACK) ×3 IMPLANT
TRAY FOLEY W/METER SILVER 16FR (SET/KITS/TRAYS/PACK) IMPLANT
TRAY LAPAROSCOPIC (CUSTOM PROCEDURE TRAY) ×3 IMPLANT
TROCAR BLADELESS OPT 5 100 (ENDOMECHANICALS) ×3 IMPLANT
TROCAR BLADELESS OPT 5 75 (ENDOMECHANICALS) ×3 IMPLANT
TROCAR XCEL 12X100 BLDLESS (ENDOMECHANICALS) IMPLANT
TROCAR XCEL BLUNT TIP 100MML (ENDOMECHANICALS) ×3 IMPLANT
TROCAR XCEL NON-BLD 11X100MML (ENDOMECHANICALS) ×3 IMPLANT
TUBING FILTER THERMOFLATOR (ELECTROSURGICAL) ×3 IMPLANT
TUNNELER SHEATH ON-Q 16GX12 DP (PAIN MANAGEMENT) ×3 IMPLANT
YANKAUER SUCT BULB TIP 10FT TU (MISCELLANEOUS) ×3 IMPLANT
YANKAUER SUCT BULB TIP NO VENT (SUCTIONS) IMPLANT

## 2015-05-08 NOTE — Op Note (Signed)
PREOPERATIVE DIAGNOSIS:  Pancreatic tail mass     POSTOPERATIVE DIAGNOSIS:  Same      PROCEDURE:  Laparoscopic spleen sparing distal pancreatectomy.      SURGEON:  Stark Klein, MD      ASSISTANT:  Jackolyn Confer, M.D.      ANESTHESIA:  General and local.      FINDINGS:  Mass adherent to lower pole of spleen.      SPECIMEN:  Distal pancreas (minimal) with mass at extreme tail.      ESTIMATED BLOOD LOSS:  50 mL      COMPLICATIONS:  None known.      PROCEDURE:  Patient was identified in the holding area and taken to   operating room where she was placed supine on the operating room table.   General anesthesia was induced.  Foley catheter was placed.  She was   placed in the leaning spleen position.  Her abdomen was prepped and   draped in sterile fashion.  Time-out was performed according to surgical   safety check list.  When all was correct, we continued.    A 5 mm Optiview port was used to access the abdomen after administration of local.  Pneumoperitoneum was achieved.   Two additional 5 mm ports were placed in the midline and then 2 were placed in the left abdomen as well.   The patient had some adhesions to the abdominal wall.  They were taken down with the Harmonic.  The lienocolic ligament was   taken down with the Harmonic.  The transverse colon was pushed inferiorly.  The mass was very distal, adherent to the inferior pole of the spleen.  Several small vessels were clipped.     The pancreas was then isolated off  the vessels.  The epigastric port was enlarged for the stapler.  The pancreas was stapled with 2 fires of the [place] stapler.   The posterior attachments were then taken   off with the Harmonic scalpel and the spleen was disconnected from the   posterior attachments. The epigastric port was enlarged again vertically in order to remove the mass.    The abdomen was irrigated and there was   no sign of any additional bleeding.   The 19 Blake drain was then passed  into the abdomen through   the epigastric port and pulled out through one of the other 5 mm trocar sites in the LLQ.  The OnQ tunnelers were placed in the preperitoneal space.    This was placed in the appropriate location.    At this point, the other 5 mm trocar was removed.  The fascia was closed with running looped 0-0 PDS.  The skin of all the incisions was then closed with 4-0 Monocryl in a subcuticular fashion and then dressed   with benzoin, Steri-Strips, and soft dressings.  The patient tolerated   the procedure well.  She was extubated and taken to the PACU in stable   condition.  Needle, sponge, and instrument counts were correct x2               Stark Klein, MD

## 2015-05-08 NOTE — Transfer of Care (Signed)
Immediate Anesthesia Transfer of Care Note  Patient: Diana Torres  Procedure(s) Performed: Procedure(s): LAPAROSCOPIC SPLEEN SPARING AND DISTAL PANCRETECTOMY  (N/A)  Patient Location: PACU  Anesthesia Type:General  Level of Consciousness:  sedated, patient cooperative and responds to stimulation  Airway & Oxygen Therapy:Patient Spontanous Breathing and Patient connected to face mask oxgen  Post-op Assessment:  Report given to PACU RN and Post -op Vital signs reviewed and stable  Post vital signs:  Reviewed and stable  Last Vitals:  Filed Vitals:   05/08/15 0548  BP: 114/71  Pulse: 80  Temp: 37 C  Resp: 16    Complications: No apparent anesthesia complications

## 2015-05-08 NOTE — Progress Notes (Signed)
05/08/15 1815 Father reported to Probation officer and [place], NT, that "her gown is soaking wet".  NT and writer went immediately to change gown and assess why gown was wet. Asked pt father if pt spilled her drink. He stated "no" and indicated to writer that pt "would not spill her drink" and seemed insulted by the question based on the tone of his voice. When gown was changed by myself and Belinda, NT, there were 3 small lightly wet areas on the gown. ON Q insertion sites had scant amount of drainage; covered sites with ABD pads. Pt father told Marliss Coots that he had told writer "an hour ago" that the gown was wet, which was incorrect. Incident reported to charge nurse and verified by NT Belinda. Donne Hazel, RN

## 2015-05-08 NOTE — Anesthesia Preprocedure Evaluation (Signed)
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  Reproductive/Obstetrics                             Anesthesia Physical Anesthesia Plan  ASA: II  Anesthesia Plan: General   Post-op Pain Management:    Induction: Intravenous  Airway Management Planned: Oral ETT  Additional Equipment:   Intra-op Plan:   Post-operative Plan: Extubation in OR  Informed Consent: I have reviewed the patients History and Physical, chart, labs and discussed the procedure including the risks, benefits and alternatives for the proposed anesthesia with the patient or authorized representative who has indicated his/her understanding and acceptance.   Dental advisory given  Plan Discussed with: CRNA, Anesthesiologist and Surgeon  Anesthesia Plan Comments:         Anesthesia Quick Evaluation

## 2015-05-08 NOTE — Anesthesia Postprocedure Evaluation (Signed)
Anesthesia Post Note  Patient: Diana Torres  Procedure(s) Performed: Procedure(s) (LRB): LAPAROSCOPIC SPLEEN SPARING AND DISTAL PANCRETECTOMY  (N/A)  Patient location during evaluation: PACU Anesthesia Type: General Level of consciousness: awake and alert Pain management: pain level controlled Vital Signs Assessment: post-procedure vital signs reviewed and stable Respiratory status: spontaneous breathing, nonlabored ventilation, respiratory function stable and patient connected to nasal cannula oxygen Cardiovascular status: blood pressure returned to baseline and stable Postop Assessment: no signs of nausea or vomiting Anesthetic complications: no    Last Vitals:  Filed Vitals:   05/08/15 1043 05/08/15 1045  BP: 153/91 138/88  Pulse: 113 102  Temp: 36.4 C   Resp: 20 27    Last Pain:  Filed Vitals:   05/08/15 1054  PainSc: 8                  Diana Torres A

## 2015-05-08 NOTE — Interval H&P Note (Signed)
History and Physical Interval Note:  [id number] AM  Diana Torres  has presented today for surgery, with the diagnosis of CYSTIC MASS LEFT UPPER QUADRANT  The various methods of treatment have been discussed with the patient and family. After consideration of risks, benefits and other options for treatment, the patient has consented to  Procedure(s): LAPAROSCOPIC DISTAL PANCRETECTOMY AND SPLENECTOMY (N/A) as a surgical intervention .  The patient's history has been reviewed, patient examined, no change in status, stable for surgery.  I have reviewed the patient's chart and labs.  Questions were answered to the patient's satisfaction.     Hermione Havlicek

## 2015-05-08 NOTE — Progress Notes (Signed)
05/08/15 1235: Pt was medicated for pain (oxy 10 mg) at 1220 and had received Toradol and Tylenol as well. Family and interpreter seemed frustrated and were asking why she has not gotten any pain relief yet. Explained to them that pt had a significant surgery and just had pain medication and that it takes time to take effect.  Dawn, Camera operator, gave pt another dose of dilaudid at 1249, which seemed to appease family and interpreter.Donne Hazel, RN

## 2015-05-08 NOTE — Anesthesia Procedure Notes (Signed)
Procedure Name: Intubation Date/Time: 05/08/2015 7:44 AM Performed by: Anne Fu Pre-anesthesia Checklist: Patient identified, Emergency Drugs available, Suction available, Patient being monitored and Timeout performed Patient Re-evaluated:Patient Re-evaluated prior to inductionOxygen Delivery Method: Circle system utilized Preoxygenation: Pre-oxygenation with 100% oxygen Intubation Type: IV induction Ventilation: Mask ventilation without difficulty Laryngoscope Size: Mac and 4 Grade View: Grade I Tube type: Oral Tube size: 7.5 mm Number of attempts: 1 Airway Equipment and Method: Stylet Placement Confirmation: ETT inserted through vocal cords under direct vision,  positive ETCO2,  CO2 detector and breath sounds checked- equal and bilateral Secured at: 20 cm Tube secured with: Tape Dental Injury: Teeth and Oropharynx as per pre-operative assessment

## 2015-05-09 LAB — CBC
HCT: 31.5 % — ABNORMAL LOW (ref 36.0–46.0)
Hemoglobin: 10.1 g/dL — ABNORMAL LOW (ref 12.0–15.0)
MCH: 28.8 pg (ref 26.0–34.0)
MCHC: 32.1 g/dL (ref 30.0–36.0)
MCV: 89.7 fL (ref 78.0–100.0)
PLATELETS: 267 10*3/uL (ref 150–400)
RBC: 3.51 MIL/uL — AB (ref 3.87–5.11)
RDW: 12.6 % (ref 11.5–15.5)
WBC: 11.6 10*3/uL — ABNORMAL HIGH (ref 4.0–10.5)

## 2015-05-09 LAB — BASIC METABOLIC PANEL
Anion gap: 5 (ref 5–15)
BUN: 5 mg/dL — AB (ref 6–20)
CO2: 26 mmol/L (ref 22–32)
Calcium: 8.4 mg/dL — ABNORMAL LOW (ref 8.9–10.3)
Chloride: 106 mmol/L (ref 101–111)
Creatinine, Ser: 0.61 mg/dL (ref 0.44–1.00)
GFR calc Af Amer: >60 mL/min (ref >60)
GLUCOSE: 121 mg/dL — AB (ref 65–99)
POTASSIUM: 4.2 mmol/L (ref 3.5–5.1)
Sodium: 137 mmol/L (ref 135–145)

## 2015-05-09 NOTE — Progress Notes (Signed)
1 Day Post-Op  Subjective: Having some issues with pain.  No nausea this AM.    Objective: Vital signs in last 24 hours: Temp:  [97.6 F (36.4 C)-98.5 F (36.9 C)] 97.9 F (36.6 C) (02/03 0525) Pulse Rate:  [74-113] 84 (02/03 0525) Resp:  [11-27] 16 (02/03 0525) BP: (109-153)/(55-91) 109/64 mmHg (02/03 0525) SpO2:  [95 %-100 %] 95 % (02/03 0525)    Intake/Output from previous day: 02/02 0701 - 02/03 0700 In: 3950 [P.O.:300; I.V.:3600; IV Piggyback:50] Out: [id number] [Urine:2240; Drains:105; Blood:150] Intake/Output this shift:    General appearance: alert, cooperative and mild distress Resp: breathing comfortably Cardio: regular rate and rhythm GI: soft, sl distended.  approp tender.  JP serosang.  some leakage from OnQ catheters  Lab Results:   Recent Labs  05/06/15 0950 05/09/15 0458  WBC 6.1 11.6*  HGB 12.6 10.1*  HCT 38.7 31.5*  PLT 310 267   BMET  Recent Labs  05/06/15 0950 05/09/15 0458  NA 137 137  K 4.0 4.2  CL 102 106  CO2 29 26  GLUCOSE 83 121*  BUN 9 5*  CREATININE 0.73 0.61  CALCIUM 9.4 8.4*   PT/INR  Recent Labs  05/06/15 0950  LABPROT 14.1  INR 1.07   ABG No results for input(s): PHART, HCO3 in the last 72 hours.  Invalid input(s): PCO2, PO2  Studies/Results: No results found.  Anti-infectives: Anti-infectives    Start     Dose/Rate Route Frequency Ordered Stop   05/08/15 1600  ceFAZolin (ANCEF) IVPB 2 g/50 mL premix     2 g 100 mL/hr over 30 Minutes Intravenous 3 times per day 05/08/15 1159 05/08/15 1559   05/08/15 0643  ceFAZolin (ANCEF) IVPB 2 g/50 mL premix     2 g 100 mL/hr over 30 Minutes Intravenous On call to O.R. 05/08/15 [id number] 05/08/15 0814      Assessment/Plan: s/p Procedure(s): LAPAROSCOPIC SPLEEN SPARING AND DISTAL PANCRETECTOMY  (N/A) PAS Continue foley due to urinary output monitoring and fluid status leave on clear liquids for now since no bowel function yet.  Ambulate IS   LOS: 1 day     [facility] 05/09/2015

## 2015-05-10 LAB — BASIC METABOLIC PANEL
Anion gap: 3 — ABNORMAL LOW (ref 5–15)
CALCIUM: 8.5 mg/dL — AB (ref 8.9–10.3)
CO2: 26 mmol/L (ref 22–32)
CREATININE: 0.6 mg/dL (ref 0.44–1.00)
Chloride: 109 mmol/L (ref 101–111)
GFR calc non Af Amer: >60 mL/min (ref >60)
Glucose, Bld: 90 mg/dL (ref 65–99)
Potassium: 4.1 mmol/L (ref 3.5–5.1)
Sodium: 138 mmol/L (ref 135–145)

## 2015-05-10 LAB — CBC
HCT: 30.2 % — ABNORMAL LOW (ref 36.0–46.0)
HEMOGLOBIN: 9.7 g/dL — AB (ref 12.0–15.0)
MCH: 29.3 pg (ref 26.0–34.0)
MCHC: 32.1 g/dL (ref 30.0–36.0)
MCV: 91.2 fL (ref 78.0–100.0)
Platelets: 235 10*3/uL (ref 150–400)
RBC: 3.31 MIL/uL — AB (ref 3.87–5.11)
RDW: 13.1 % (ref 11.5–15.5)
WBC: 8.7 10*3/uL (ref 4.0–10.5)

## 2015-05-10 MED ORDER — OXYCODONE HCL 5 MG PO TABS
5.0000 mg | ORAL_TABLET | ORAL | Status: DC | PRN
  Administered 2015-05-11: 5 mg via ORAL
  Administered 2015-05-12: 10 mg via ORAL
  Filled 2015-05-10: qty 1
  Filled 2015-05-10: qty 2

## 2015-05-10 NOTE — Progress Notes (Signed)
2 Days Post-Op  Subjective: Does not feel like the PO meds are working for pain but IV meds seem ok.  No nausea this AM but has no appetite either.  Passing some flatus.    Objective: Vital signs in last 24 hours: Temp:  [97.5 F (36.4 C)-98.9 F (37.2 C)] 98.5 F (36.9 C) (02/04 0552) Pulse Rate:  [66-73] 73 (02/04 0552) Resp:  [18] 18 (02/04 0552) BP: (105-114)/(52-67) 108/67 mmHg (02/04 0552) SpO2:  [96 %-98 %] 96 % (02/04 0552) Last BM Date: 05/07/15  Intake/Output from previous day: 02/03 0701 - 02/04 0700 In: 3725 [P.O.:1320; I.V.:2400] Out: 70 [Urine:3500; Drains:50] Intake/Output this shift: Total I/O In: -  Out: 480 [Urine:450; Drains:30]  General appearance: alert, cooperative and mild distress Resp: breathing comfortably Cardio: regular rate and rhythm GI: soft, sl distended.  approp tender.  JP serosang.  some leakage from OnQ catheters  Lab Results:   Recent Labs  05/09/15 0458 05/10/15 0522  WBC 11.6* 8.7  HGB 10.1* 9.7*  HCT 31.5* 30.2*  PLT 267 235   BMET  Recent Labs  05/09/15 0458 05/10/15 0522  NA 137 138  K 4.2 4.1  CL 106 109  CO2 26 26  GLUCOSE 121* 90  BUN 5* <5*  CREATININE 0.61 0.60  CALCIUM 8.4* 8.5*   PT/INR No results for input(s): LABPROT, INR in the last 72 hours. ABG No results for input(s): PHART, HCO3 in the last 72 hours.  Invalid input(s): PCO2, PO2  Studies/Results: No results found.  Anti-infectives: Anti-infectives    Start     Dose/Rate Route Frequency Ordered Stop   05/08/15 1600  ceFAZolin (ANCEF) IVPB 2 g/50 mL premix     2 g 100 mL/hr over 30 Minutes Intravenous 3 times per day 05/08/15 1159 05/08/15 1559   05/08/15 0643  ceFAZolin (ANCEF) IVPB 2 g/50 mL premix     2 g 100 mL/hr over 30 Minutes Intravenous On call to O.R. 05/08/15 [id number] 05/08/15 0814      Assessment/Plan: s/p Procedure(s): LAPAROSCOPIC SPLEEN SPARING AND DISTAL PANCRETECTOMY  (N/A) PAS D/C foley, good uop leave on clear  liquids for now since no bowel function yet and rather distended. Will continue mainly IV pain meds for now until bowel function improves  Ambulate IS   LOS: 2 days    Diana Torres C. [id number]

## 2015-05-11 LAB — URINALYSIS, ROUTINE W REFLEX MICROSCOPIC
BILIRUBIN URINE: NEGATIVE
GLUCOSE, UA: NEGATIVE mg/dL
KETONES UR: NEGATIVE mg/dL
Nitrite: NEGATIVE
PH: 7 (ref 5.0–8.0)
Protein, ur: NEGATIVE mg/dL
Specific Gravity, Urine: 1.007 (ref 1.005–1.030)

## 2015-05-11 LAB — URINE MICROSCOPIC-ADD ON

## 2015-05-11 MED ORDER — SULFAMETHOXAZOLE-TRIMETHOPRIM 800-160 MG PO TABS
1.0000 | ORAL_TABLET | Freq: Two times a day (BID) | ORAL | Status: DC
Start: 2015-05-11 — End: 2015-05-13
  Administered 2015-05-11 – 2015-05-13 (×4): 1 via ORAL
  Filled 2015-05-11 (×5): qty 1

## 2015-05-11 MED ORDER — PANTOPRAZOLE SODIUM 40 MG PO TBEC
40.0000 mg | DELAYED_RELEASE_TABLET | Freq: Every day | ORAL | Status: DC
  Administered 2015-05-11 – 2015-05-12 (×2): 40 mg via ORAL
  Filled 2015-05-11 (×4): qty 1

## 2015-05-11 NOTE — Progress Notes (Signed)
Pt felt like bladder was still full after voiding a small amount of urine. Urine output adequate for shift. Bladder scanned patient. Showed only 19ccs. Asked pt if she felt a burning sensation while voiding, pt stated she did. Small amount of blood in urine. Pt voided small amounts x2 shortly after. Will continue to monitor.  Roselind Rily

## 2015-05-11 NOTE — Progress Notes (Signed)
3 Days Post-Op  Subjective: Nausea better.  Pain controlled.  Passing flatus.    Objective: Vital signs in last 24 hours: Temp:  [98.2 F (36.8 C)-98.5 F (36.9 C)] 98.2 F (36.8 C) (02/04 2200) Pulse Rate:  [69-84] 84 (02/04 2200) Resp:  [17-18] 17 (02/04 2200) BP: (110-119)/(61-71) 118/71 mmHg (02/04 2200) SpO2:  [99 %] 99 % (02/04 2200) Last BM Date: 05/07/15  Intake/Output from previous day: 02/04 0701 - 02/05 0700 In: 1721.7 [P.O.:120; I.V.:1601.7] Out: 2750 [Urine:2700; Drains:50] Intake/Output this shift:    General appearance: alert, cooperative and mild distress Resp: breathing comfortably Cardio: regular rate and rhythm GI: soft, sl distended.  approp tender.  JP serosang.  some leakage from OnQ catheters  Lab Results:   Recent Labs  05/09/15 0458 05/10/15 0522  WBC 11.6* 8.7  HGB 10.1* 9.7*  HCT 31.5* 30.2*  PLT 267 235   BMET  Recent Labs  05/09/15 0458 05/10/15 0522  NA 137 138  K 4.2 4.1  CL 106 109  CO2 26 26  GLUCOSE 121* 90  BUN 5* <5*  CREATININE 0.61 0.60  CALCIUM 8.4* 8.5*   PT/INR No results for input(s): LABPROT, INR in the last 72 hours. ABG No results for input(s): PHART, HCO3 in the last 72 hours.  Invalid input(s): PCO2, PO2  Studies/Results: No results found.  Anti-infectives: Anti-infectives    Start     Dose/Rate Route Frequency Ordered Stop   05/08/15 1600  ceFAZolin (ANCEF) IVPB 2 g/50 mL premix     2 g 100 mL/hr over 30 Minutes Intravenous 3 times per day 05/08/15 1159 05/08/15 1559   05/08/15 0643  ceFAZolin (ANCEF) IVPB 2 g/50 mL premix     2 g 100 mL/hr over 30 Minutes Intravenous On call to O.R. 05/08/15 [id number] 05/08/15 0814      Assessment/Plan: s/p Procedure(s): LAPAROSCOPIC SPLEEN SPARING DISTAL PANCRETECTOMY  (N/A) PAS Urinary urgency- will check UA Will try to advance diet today to soft foods Will continue IV pain meds but encourage pain pills today if she tolerates her diet  better Ambulate IS   LOS: 3 days    Estephan Gallardo C. [id number]

## 2015-05-11 NOTE — Progress Notes (Signed)
The patient is receiving Protonix by the intravenous route.  Based on criteria approved by the Pharmacy and [place], the medication is being converted to the equivalent oral dose form.  These criteria include: -No Active GI bleeding -Able to tolerate diet of full liquids (or better) or tube feeding -Able to tolerate other medications by the oral or enteral route  If you have any questions about this conversion, please contact the Pharmacy Department (phone [phone]).  Thank you.  Minda Ditto PharmD Pager [phone] 05/11/2015, 12:28 PM

## 2015-05-12 MED ORDER — BISACODYL 10 MG RE SUPP
10.0000 mg | Freq: Every day | RECTAL | Status: DC
  Administered 2015-05-12 – 2015-05-13 (×2): 10 mg via RECTAL
  Filled 2015-05-12 (×2): qty 1

## 2015-05-12 MED ORDER — OXYCODONE HCL 5 MG PO TABS
5.0000 mg | ORAL_TABLET | ORAL | Status: DC | PRN
  Administered 2015-05-12 – 2015-05-13 (×4): 10 mg via ORAL
  Filled 2015-05-12 (×4): qty 2

## 2015-05-12 MED ORDER — DOCUSATE SODIUM 100 MG PO CAPS
100.0000 mg | ORAL_CAPSULE | Freq: Two times a day (BID) | ORAL | Status: DC
  Administered 2015-05-12 – 2015-05-13 (×3): 100 mg via ORAL
  Filled 2015-05-12 (×4): qty 1

## 2015-05-12 MED ORDER — IBUPROFEN 600 MG PO TABS
600.0000 mg | ORAL_TABLET | Freq: Three times a day (TID) | ORAL | Status: DC
  Administered 2015-05-12 – 2015-05-13 (×4): 600 mg via ORAL
  Filled 2015-05-12: qty 1
  Filled 2015-05-12 (×2): qty 3
  Filled 2015-05-12 (×5): qty 1

## 2015-05-12 NOTE — Significant Event (Signed)
Rapid Response Event Note  Overview: Time Called: 2130 Event Type: Respiratory  Initial Focused Assessment: Pt is post op day four from a [place].  Pt lying in bed, with HOB approx. 30 degrees elevated, pt had a concerned look in her eyes, very tense.  Family at bedside, pt has history of being deaf, so family is signing for me.  Pt complaining of chest pain low substernal in nature, approx. 1/2 inch above incision.  Pt had been recently medicated for c/o pain with ordered pain meds, however, pt describes this pain as new and different.  Pt also had been working with IS prior to pain starting, taking deep breaths.  Pt does describe her pain as increasing with deep inspiration.  Lungs sound clear bilat., no coughing noted, O2 Saturation 100% on 2L [place], RR 16-22.  Interventions: Keep O2 @ 2L [place], for pt comfort, unless pt wants off. 12 Lead EKG, which showed NSR, with no ST elevation. Education done regarding use of IS and pain. Notify Dr. Zella Richer of above and ask for something to help her with anxiety.  (Dr. Zella Richer was notified by staff RN.  No new orders were received.)   Event Summary: Name of Physician Notified: Dr. Zella Richer at 2150  Outcome: Stayed in room and stabalized  Event End Time: 2155  [place] Coordinator [facility]

## 2015-05-12 NOTE — Progress Notes (Signed)
Patient ID: Diana Torres, female   DOB: 1990/11/30, 25 y.o.   MRN: [id number] 4 Days Post-Op  Subjective: Continues to have flatus.  No Bm yet.  No n/v.    Objective: Vital signs in last 24 hours: Temp:  [98 F (36.7 C)-98.4 F (36.9 C)] 98.4 F (36.9 C) (02/06 0600) Pulse Rate:  [72-84] 72 (02/06 0600) Resp:  [18] 18 (02/06 0600) BP: (90-110)/(59-69) 90/59 mmHg (02/06 0600) SpO2:  [96 %-99 %] 96 % (02/06 0600) Last BM Date: 05/07/15  Intake/Output from previous day: 02/05 0701 - 02/06 0700 In: 1804 [P.O.:720; I.V.:1084] Out: [id number] [Urine:3700; Drains:37] Intake/Output this shift:    General appearance: alert, cooperative and mild distress Resp: breathing comfortably Cardio: regular rate and rhythm GI: soft, nondistended.  approp tender.  JP serosang.    Lab Results:   Recent Labs  05/10/15 0522  WBC 8.7  HGB 9.7*  HCT 30.2*  PLT 235   BMET  Recent Labs  05/10/15 0522  NA 138  K 4.1  CL 109  CO2 26  GLUCOSE 90  BUN <5*  CREATININE 0.60  CALCIUM 8.5*   PT/INR No results for input(s): LABPROT, INR in the last 72 hours. ABG No results for input(s): PHART, HCO3 in the last 72 hours.  Invalid input(s): PCO2, PO2  Studies/Results: No results found.  Anti-infectives: Anti-infectives    Start     Dose/Rate Route Frequency Ordered Stop   05/11/15 1600  sulfamethoxazole-trimethoprim (BACTRIM DS,SEPTRA DS) 800-160 MG per tablet 1 tablet     1 tablet Oral Every 12 hours 05/11/15 1524 05/21/15 2159   05/08/15 1600  ceFAZolin (ANCEF) IVPB 2 g/50 mL premix     2 g 100 mL/hr over 30 Minutes Intravenous 3 times per day 05/08/15 1159 05/08/15 1559   05/08/15 0643  ceFAZolin (ANCEF) IVPB 2 g/50 mL premix     2 g 100 mL/hr over 30 Minutes Intravenous On call to O.R. 05/08/15 [id number] 05/08/15 0814      Assessment/Plan: s/p Procedure(s): LAPAROSCOPIC SPLEEN SPARING DISTAL PANCRETECTOMY  (N/A) PAS UTI - on bactrim Regular diet Add TID ibuprofen.  Continue  standing tylenol Suppository.  BID colace.   Ambulate IS   LOS: 4 days    Horacio Werth 05/12/2015

## 2015-05-12 NOTE — Progress Notes (Signed)
Around [id number].  Diana Torres, Diana Torres [id number], Benign Pancreatic mass removed 05/08/15.  Appear to  very anxious and tachy at 123, highest, oxygen at 100% on 1L. Telling family members and  nurse and family do something.   Rapid response in. EKG done, RRN feels its anxiety. She was assessed.   Notified Dr. Zella Richer,  He is aware and said to monitor her for changes.

## 2015-05-13 MED ORDER — OXYCODONE HCL 5 MG PO TABS: 5.0000 mg | ORAL_TABLET | ORAL | Status: DC | PRN

## 2015-05-13 MED ORDER — ONDANSETRON 4 MG PO TBDP: 4.0000 mg | ORAL_TABLET | Freq: Four times a day (QID) | ORAL | Status: DC | PRN

## 2015-05-13 MED ORDER — METHOCARBAMOL 500 MG PO TABS: 500.0000 mg | ORAL_TABLET | Freq: Four times a day (QID) | ORAL | Status: DC | PRN

## 2015-05-13 MED ORDER — SULFAMETHOXAZOLE-TRIMETHOPRIM 800-160 MG PO TABS: 1.0000 | ORAL_TABLET | Freq: Two times a day (BID) | ORAL | Status: DC

## 2015-05-13 MED ORDER — ACETAMINOPHEN 325 MG PO TABS: 650.0000 mg | ORAL_TABLET | Freq: Four times a day (QID) | ORAL | Status: DC | PRN

## 2015-05-13 MED ORDER — SORBITOL 70 % SOLN
960.0000 mL | TOPICAL_OIL | Freq: Once | ORAL | Status: AC
  Administered 2015-05-13: 960 mL via RECTAL
  Filled 2015-05-13: qty 240

## 2015-05-13 MED ORDER — IBUPROFEN 600 MG PO TABS: 600.0000 mg | ORAL_TABLET | Freq: Three times a day (TID) | ORAL | Status: DC

## 2015-05-13 NOTE — Progress Notes (Signed)
Assessment unchanged. Able to have bm after suppository earlier today then more after SMOG enema. Pt verbalized understanding of dc instructions through teach back including follow up care and when to call the doctor. [facility] person for the Deaf and Hard of Hearing) intrepreter, Juliann Pulse, present to assist with discharge as well as mother. Script x 1 given as provided by Md. No complaints at dc. In good spirits. Discharged via wc to front entrance to meet awaiting vehicle to carry home. Accompanied by NT and mother.

## 2015-05-13 NOTE — Discharge Instructions (Signed)
[facility], [place] [phone]  ABDOMINAL SURGERY: POST OP INSTRUCTIONS  Always review your discharge instruction sheet given to you by the facility where your surgery was performed.  IF YOU HAVE DISABILITY OR FAMILY LEAVE FORMS, YOU MUST BRING THEM TO THE OFFICE FOR PROCESSING.  PLEASE DO NOT GIVE THEM TO YOUR DOCTOR.  1. A prescription for pain medication may be given to you upon discharge.  Take your pain medication as prescribed, if needed.  If narcotic pain medicine is not needed, then you may take acetaminophen (Tylenol) or ibuprofen (Advil) as needed. 2. Take your usually prescribed medications unless otherwise directed. 3. If you need a refill on your pain medication, please contact your pharmacy. They will contact our office to request authorization.  Prescriptions will not be filled after 5pm or on week-ends. 4. You should follow a light diet the first few days after arrival home, such as soup and crackers, pudding, etc.unless your doctor has advised otherwise. A high-fiber, low fat diet can be resumed as tolerated.   Be sure to include lots of fluids daily. Most patients will experience some swelling and bruising on the chest and neck area.  Ice packs will help.  Swelling and bruising can take several days to resolve 5. Most patients will experience some swelling and bruising in the area of the incision. Ice pack will help. Swelling and bruising can take several days to resolve..  6. It is common to experience some constipation if taking pain medication after surgery.  Increasing fluid intake and taking a stool softener will usually help or prevent this problem from occurring.  A mild laxative (Milk of Magnesia or Miralax) should be taken according to package directions if there are no bowel movements after 48 hours. 7.  You may have steri-strips (small skin tapes) in place directly over the incision.  These strips should be left on the skin for 10-14 days.  If your  surgeon used skin glue on the incision, you may shower in 48 hours.  The glue will flake off over the next 2-3 weeks.  Any sutures or staples will be removed at the office during your follow-up visit. You may find that a light gauze bandage over your incision may keep your staples from being rubbed or pulled. You may shower and replace the bandage daily. 8. ACTIVITIES:  You may resume regular (light) daily activities beginning the next day--such as daily self-care, walking, climbing stairs--gradually increasing activities as tolerated.  You may have sexual intercourse when it is comfortable.  Refrain from any heavy lifting or straining until approved by your doctor. a. You may drive when you no longer are taking prescription pain medication, you can comfortably wear a seatbelt, and you can safely maneuver your car and apply brakes b. Return to Work: 123456 weeks if applicable_________________________ 9. You should see your doctor in the office for a follow-up appointment approximately two weeks after your surgery.  Make sure that you call for this appointment within a day or two after you arrive home to insure a convenient appointment time. OTHER INSTRUCTIONS:  _____________________________________________________________ _____________________________________________________________  WHEN TO CALL YOUR DOCTOR: 1. Fever over 101.0 2. Inability to urinate 3. Nausea and/or vomiting 4. Extreme swelling or bruising 5. Continued bleeding from incision. 6. Increased pain, redness, or drainage from the incision. 7. Difficulty swallowing or breathing 8. Muscle cramping or spasms. 9. Numbness or tingling in hands or feet or around lips.  The clinic staff is  available to answer your questions during regular business hours.  Please don’t hesitate to call and ask to speak to one of the nurses if you have concerns. ° °For further questions, please visit [email or web address] ° ° ° °

## 2015-05-13 NOTE — Discharge Summary (Signed)
Physician Discharge Summary  Patient ID: Diana Torres MRN: [id number] DOB/AGE: 10/17/1990 25 y.o.  Admit date: 05/08/2015 Discharge date: 05/13/2015  Admission Diagnoses: Pancreatic mass Hearing impaired  Discharge Diagnoses:  Active Problems:   Pancreatic mass hearing impaired  Discharged Condition: stable  Hospital Course:  Patient was admitted to the floor following her laparoscopic distal pancreatectomy, spleen sparing. Patient had significant issues with pain overnight despite OnQ catheter placement. Her IV pain medication was made closer together. She was placed on Tylenol and Toradol as well. Both of these seem to help. She tried a muscle relaxant which did not help as much. She did not require supplemental oxygen. She did work diligently on her incentive spirometer. Rounds were made daily with an ASL interpreter.  She was able to void independently with her Foley removed. However she complained of dysuria. A urinalysis was positive for a UTI. She was started on oral Bactrim. She had an anticipated postoperative ileus initially after surgery. This began to resolve on postop day 3. Her diet was advanced to full liquids. Once she was on soft diet, oral narcotics were started. Her drain remained low output with serosanguinous character.  This was removed.  She did not have significant nausea at that point but had not yet had a bowel movement and was still experiencing some bloating. She was given a suppository with no effect. She had an enema on the day of discharge with good results. She is discharged home in stable condition.  Consults: None  Significant Diagnostic Studies: labs: HCT 30.2 prior to D/C.  Cr 0.60  Treatments: surgery: see above  Discharge Exam: Blood pressure 119/73, pulse 78, temperature 98.4 F (36.9 C), temperature source Oral, resp. rate 17, height 5' 3.5" (1.613 m), weight 69.854 kg (154 lb), SpO2 100 %. General appearance: alert, cooperative and no  distress Resp: breathing comfortably Cardio: regular rate and rhythm GI: soft, non tender, non distended.  incisions without erythema or drainage.    Disposition: 01-Home or Self Care      Discharge Instructions    Call MD for:  persistant nausea and vomiting    Complete by:  As directed      Call MD for:  redness, tenderness, or signs of infection (pain, swelling, redness, odor or green/yellow discharge around incision site)    Complete by:  As directed      Call MD for:  severe uncontrolled pain    Complete by:  As directed      Call MD for:  temperature >100.4    Complete by:  As directed      Diet - low sodium heart healthy    Complete by:  As directed      Increase activity slowly    Complete by:  As directed             Medication List    TAKE these medications        acetaminophen 325 MG tablet  Commonly known as:  TYLENOL  Take 2 tablets (650 mg total) by mouth every 6 (six) hours as needed for mild pain (or temp > 100).     docusate sodium 100 MG capsule  Commonly known as:  COLACE  Take 1 capsule (100 mg total) by mouth every 12 (twelve) hours.     ibuprofen 600 MG tablet  Commonly known as:  ADVIL,MOTRIN  Take 1 tablet (600 mg total) by mouth 3 (three) times daily.     methocarbamol 500 MG tablet  Commonly  known as:  ROBAXIN  Take 1 tablet (500 mg total) by mouth every 6 (six) hours as needed for muscle spasms.     ondansetron 4 MG disintegrating tablet  Commonly known as:  ZOFRAN-ODT  Take 4 mg by mouth every 6 (six) hours as needed for nausea or vomiting.     ondansetron 4 MG disintegrating tablet  Commonly known as:  ZOFRAN-ODT  Take 1 tablet (4 mg total) by mouth every 6 (six) hours as needed for nausea.     oxyCODONE 5 MG immediate release tablet  Commonly known as:  Oxy IR/ROXICODONE  Take 1-3 tablets (5-15 mg total) by mouth every 3 (three) hours as needed (pain).     sulfamethoxazole-trimethoprim 800-160 MG tablet  Commonly known as:   BACTRIM DS,SEPTRA DS  Take 1 tablet by mouth every 12 (twelve) hours.         SignedStark Klein 05/13/2015, 10:31 PM

## 2015-05-13 NOTE — Care Management Note (Signed)
Case Management Note  Patient Details  Name: Diana Torres MRN: [id number] Date of Birth: [birth year]  Subjective/Objective:    S/p Laparoscopic spleen sparing distal pancreatectomy                Action/Plan: Discharge planning, no HH needs identified  Expected Discharge Date:  05/10/15               Expected Discharge Plan:  Home/Self Care  In-House Referral:  NA  Discharge planning Services  CM Consult  Post Acute Care Choice:  NA Choice offered to:  NA  DME Arranged:  N/A DME Agency:  NA  HH Arranged:  NA HH Agency:  NA  Status of Service:  Completed, signed off  Medicare Important Message Given:    Date Medicare IM Given:    Medicare IM give by:    Date Additional Medicare IM Given:    Additional Medicare Important Message give by:     If discussed at [place] of Stay Meetings, dates discussed:    Additional Comments:  Guadalupe Maple, RN 05/13/2015, 10:34 AM [phone]

## 2015-06-05 ENCOUNTER — Other Ambulatory Visit (HOSPITAL_COMMUNITY): Payer: Self-pay | Admitting: General Surgery

## 2015-06-05 ENCOUNTER — Ambulatory Visit (HOSPITAL_COMMUNITY)
Admission: RE | Admit: 2015-06-05 | Discharge: 2015-06-05 | Disposition: A | Discharge status: Home / Self Care | Payer: Medicaid Other | Source: Ambulatory Visit | Attending: General Surgery | Admitting: General Surgery

## 2015-06-05 ENCOUNTER — Encounter (HOSPITAL_COMMUNITY): Payer: Self-pay

## 2015-06-05 DIAGNOSIS — Z90411 Acquired partial absence of pancreas: Secondary | ICD-10-CM | POA: Diagnosis not present

## 2015-06-05 DIAGNOSIS — R1032 Left lower quadrant pain: Secondary | ICD-10-CM | POA: Insufficient documentation

## 2015-06-05 MED ORDER — IOHEXOL 300 MG/ML  SOLN
100.0000 mL | Freq: Once | INTRAMUSCULAR | Status: AC | PRN
  Administered 2015-06-05: 100 mL via INTRAVENOUS

## 2015-08-12 ENCOUNTER — Other Ambulatory Visit: Payer: Self-pay | Admitting: General Surgery

## 2015-08-12 DIAGNOSIS — G8918 Other acute postprocedural pain: Secondary | ICD-10-CM

## 2015-08-14 ENCOUNTER — Ambulatory Visit
Admission: RE | Admit: 2015-08-14 | Discharge: 2015-08-14 | Disposition: A | Discharge status: Home / Self Care | Payer: Medicaid Other | Source: Ambulatory Visit | Attending: General Surgery | Admitting: General Surgery

## 2015-08-14 DIAGNOSIS — G8918 Other acute postprocedural pain: Secondary | ICD-10-CM

## 2015-08-14 MED ORDER — IOPAMIDOL (ISOVUE-300) INJECTION 61%
100.0000 mL | Freq: Once | INTRAVENOUS | Status: AC | PRN
  Administered 2015-08-14: 100 mL via INTRAVENOUS

## 2015-08-14 NOTE — Progress Notes (Signed)
Quick Note:  Please let patient family know Ct looks OK. No evidence of inflammation of the pancreas. There is a fair amount of stool. I recommend being aggressive with stool softeners and/or miralax daily. If nausea continues, we may need to refer her to GI for an endoscopy. ______

## 2015-09-22 ENCOUNTER — Inpatient Hospital Stay (HOSPITAL_COMMUNITY)
Admission: AD | Admit: 2015-09-22 | Discharge: 2015-09-22 | Disposition: A | Discharge status: Home / Self Care | Payer: Medicaid Other | Source: Ambulatory Visit | Attending: Obstetrics & Gynecology | Admitting: Obstetrics & Gynecology

## 2015-09-22 ENCOUNTER — Encounter: Payer: Self-pay | Admitting: Obstetrics & Gynecology

## 2015-09-22 ENCOUNTER — Ambulatory Visit (INDEPENDENT_AMBULATORY_CARE_PROVIDER_SITE_OTHER): Payer: Medicaid Other

## 2015-09-22 DIAGNOSIS — N912 Amenorrhea, unspecified: Secondary | ICD-10-CM | POA: Diagnosis not present

## 2015-09-22 DIAGNOSIS — Z3201 Encounter for pregnancy test, result positive: Secondary | ICD-10-CM | POA: Diagnosis present

## 2015-09-22 DIAGNOSIS — Z3200 Encounter for pregnancy test, result unknown: Secondary | ICD-10-CM

## 2015-09-22 NOTE — MAU Note (Signed)
+  HPT yesterday..  Wants confirmation.

## 2015-09-22 NOTE — MAU Provider Note (Signed)
[name] is a 25 y.o. G2P1011 at Unknown who presents to MAU today for pregnancy verification. The patient denies abdominal pain or vaginal bleeding today.   BP 137/79 mmHg  Pulse 88  Temp(Src) 98.3 F (36.8 C) (Oral)  Resp 16  Wt 148 lb (67.132 kg)  LMP 08/18/2015  CONSTITUTIONAL: Well-developed, well-nourished female in no acute distress.  CARDIOVASCULAR: Regular heart rate RESPIRATORY: Normal effort NEUROLOGICAL: Alert and oriented to person, place, and time.  SKIN: Skin is warm and dry. No rash noted. Not diaphoretic. No erythema. No pallor. PSYCH: Normal mood and affect. Normal behavior. Normal judgment and thought content.  MDM Medical Screen Exam Complete  A: Amenorrhea  P: Discharge from MAU Patient advised to follow-up with [place] for pregnancy confirmation  Monday-Thursday 8am-4pm or Friday 8am-11am Patient may return to MAU as needed or if her condition were to change or worsen   Lezlie Lye, NP  09/22/2015 3:30 PM

## 2015-09-22 NOTE — Progress Notes (Signed)
Pt presented today for a pregnancy test. Test confirms patient is currently pregnant around 5 weeks. Pt would like to start care with our office. Pt advised taking prenatal  Vitamins.

## 2015-10-04 ENCOUNTER — Inpatient Hospital Stay (HOSPITAL_COMMUNITY): Payer: Medicaid Other

## 2015-10-04 ENCOUNTER — Encounter (HOSPITAL_COMMUNITY): Payer: Self-pay | Admitting: Medical

## 2015-10-04 ENCOUNTER — Inpatient Hospital Stay (HOSPITAL_COMMUNITY)
Admission: AD | Admit: 2015-10-04 | Discharge: 2015-10-04 | Disposition: A | Discharge status: Home / Self Care | Payer: Medicaid Other | Source: Ambulatory Visit | Attending: Family Medicine | Admitting: Family Medicine

## 2015-10-04 DIAGNOSIS — O209 Hemorrhage in early pregnancy, unspecified: Secondary | ICD-10-CM | POA: Diagnosis not present

## 2015-10-04 DIAGNOSIS — O43891 Other placental disorders, first trimester: Secondary | ICD-10-CM

## 2015-10-04 DIAGNOSIS — R109 Unspecified abdominal pain: Secondary | ICD-10-CM | POA: Diagnosis present

## 2015-10-04 DIAGNOSIS — O468X1 Other antepartum hemorrhage, first trimester: Secondary | ICD-10-CM

## 2015-10-04 DIAGNOSIS — O418X10 Other specified disorders of amniotic fluid and membranes, first trimester, not applicable or unspecified: Secondary | ICD-10-CM

## 2015-10-04 DIAGNOSIS — Z3A01 Less than 8 weeks gestation of pregnancy: Secondary | ICD-10-CM | POA: Insufficient documentation

## 2015-10-04 DIAGNOSIS — Z885 Allergy status to narcotic agent status: Secondary | ICD-10-CM | POA: Insufficient documentation

## 2015-10-04 DIAGNOSIS — O26899 Other specified pregnancy related conditions, unspecified trimester: Secondary | ICD-10-CM

## 2015-10-04 LAB — URINALYSIS, ROUTINE W REFLEX MICROSCOPIC
Bilirubin Urine: NEGATIVE
GLUCOSE, UA: NEGATIVE mg/dL
Hgb urine dipstick: NEGATIVE
KETONES UR: NEGATIVE mg/dL
LEUKOCYTES UA: NEGATIVE
NITRITE: NEGATIVE
PROTEIN: NEGATIVE mg/dL
Specific Gravity, Urine: 1.02 (ref 1.005–1.030)
pH: 6 (ref 5.0–8.0)

## 2015-10-04 LAB — CBC WITH DIFFERENTIAL/PLATELET
BASOS ABS: 0 10*3/uL (ref 0.0–0.1)
BASOS PCT: 0 %
EOS ABS: 0.2 10*3/uL (ref 0.0–0.7)
Eosinophils Relative: 2 %
HCT: 35.7 % — ABNORMAL LOW (ref 36.0–46.0)
HEMOGLOBIN: 12.1 g/dL (ref 12.0–15.0)
Lymphocytes Relative: 28 %
Lymphs Abs: 3.1 10*3/uL (ref 0.7–4.0)
MCH: 28.7 pg (ref 26.0–34.0)
MCHC: 33.9 g/dL (ref 30.0–36.0)
MCV: 84.8 fL (ref 78.0–100.0)
MONOS PCT: 3 %
Monocytes Absolute: 0.4 10*3/uL (ref 0.1–1.0)
NEUTROS PCT: 67 %
Neutro Abs: 7.3 10*3/uL (ref 1.7–7.7)
Platelets: 273 10*3/uL (ref 150–400)
RBC: 4.21 MIL/uL (ref 3.87–5.11)
RDW: 13.3 % (ref 11.5–15.5)
WBC: 11.1 10*3/uL — ABNORMAL HIGH (ref 4.0–10.5)

## 2015-10-04 LAB — WET PREP, GENITAL
Clue Cells Wet Prep HPF POC: NONE SEEN
Sperm: NONE SEEN
Trich, Wet Prep: NONE SEEN
YEAST WET PREP: NONE SEEN

## 2015-10-04 LAB — POCT PREGNANCY, URINE: PREG TEST UR: POSITIVE — AB

## 2015-10-04 LAB — HCG, QUANTITATIVE, PREGNANCY: HCG, BETA CHAIN, QUANT, S: 54806 m[IU]/mL — AB (ref <5)

## 2015-10-04 MED ORDER — PROMETHAZINE HCL 12.5 MG PO TABS: 12.5000 mg | ORAL_TABLET | Freq: Four times a day (QID) | ORAL | Status: DC | PRN

## 2015-10-04 NOTE — Discharge Instructions (Signed)
Pelvic Rest Pelvic rest is sometimes recommended for women when:   The placenta is partially or completely covering the opening of the cervix (placenta previa).  There is bleeding between the uterine wall and the amniotic sac in the first trimester (subchorionic hemorrhage).  The cervix begins to open without labor starting (incompetent cervix, cervical insufficiency).  The labor is too early (preterm labor). HOME CARE INSTRUCTIONS  Do not have sexual intercourse, stimulation, or an orgasm.  Do not use tampons, douche, or put anything in the vagina.  Do not lift anything over 10 pounds (4.5 kg).  Avoid strenuous activity or straining your pelvic muscles. SEEK MEDICAL CARE IF:  You have any vaginal bleeding during pregnancy. Treat this as a potential emergency.  You have cramping pain felt low in the stomach (stronger than menstrual cramps).  You notice vaginal discharge (watery, mucus, or bloody).  You have a low, dull backache.  There are regular contractions or uterine tightening. SEEK IMMEDIATE MEDICAL CARE IF: You have vaginal bleeding and have placenta previa.    This information is not intended to replace advice given to you by your health care provider. Make sure you discuss any questions you have with your health care provider.   Document Released: 07/17/2010 Document Revised: 06/14/2011 Document Reviewed: 09/23/2014 Elsevier Interactive Patient Education 2016 [place] Hematoma A subchorionic hematoma is a gathering of blood between the outer wall of the placenta and the inner wall of the womb (uterus). The placenta is the organ that connects the fetus to the wall of the uterus. The placenta performs the feeding, breathing (oxygen to the fetus), and waste removal (excretory work) of the fetus.  Subchorionic hematoma is the most common abnormality found on a result from ultrasonography done during the first trimester or early second trimester of  pregnancy. If there has been little or no vaginal bleeding, early small hematomas usually shrink on their own and do not affect your baby or pregnancy. The blood is gradually absorbed over 1-2 weeks. When bleeding starts later in pregnancy or the hematoma is larger or occurs in an older pregnant woman, the outcome may not be as good. Larger hematomas may get bigger, which increases the chances for miscarriage. Subchorionic hematoma also increases the risk of premature detachment of the placenta from the uterus, preterm (premature) labor, and stillbirth. HOME CARE INSTRUCTIONS  Stay on bed rest if your health care provider recommends this. Although bed rest will not prevent more bleeding or prevent a miscarriage, your health care provider may recommend bed rest until you are advised otherwise.  Avoid heavy lifting (more than 10 lb [4.5 kg]), exercise, sexual intercourse, or douching as directed by your health care provider.  Keep track of the number of pads you use each day and how soaked (saturated) they are. Write down this information.  Do not use tampons.  Keep all follow-up appointments as directed by your health care provider. Your health care provider may ask you to have follow-up blood tests or ultrasound tests or both. SEEK IMMEDIATE MEDICAL CARE IF:  You have severe cramps in your stomach, back, abdomen, or pelvis.  You have a fever.  You pass large clots or tissue. Save any tissue for your health care provider to look at.  Your bleeding increases or you become lightheaded, feel weak, or have fainting episodes.   This information is not intended to replace advice given to you by your health care provider. Make sure you discuss any questions you have with  your health care provider.   Document Released: 07/07/2006 Document Revised: 04/12/2014 Document Reviewed: 10/19/2012 Elsevier Interactive Patient Education 2016 Elsevier Inc. Morning Sickness Morning sickness is when you feel  sick to your stomach (nauseous) during pregnancy. You may feel sick to your stomach and throw up (vomit). You may feel sick in the morning, but you can feel this way any time of day. Some women feel very sick to their stomach and cannot stop throwing up (hyperemesis gravidarum). HOME CARE  Only take medicines as told by your doctor.  Take multivitamins as told by your doctor. Taking multivitamins before getting pregnant can stop or lessen the harshness of morning sickness.  Eat dry toast or unsalted crackers before getting out of bed.  Eat 5 to 6 small meals a day.  Eat dry and bland foods like rice and baked potatoes.  Do not drink liquids with meals. Drink between meals.  Do not eat greasy, fatty, or spicy foods.  Have someone cook for you if the smell of food causes you to feel sick or throw up.  If you feel sick to your stomach after taking prenatal vitamins, take them at night or with a snack.  Eat protein when you need a snack (nuts, yogurt, cheese).  Eat unsweetened gelatins for dessert.  Wear a bracelet used for sea sickness (acupressure wristband).  Go to a doctor that puts thin needles into certain body points (acupuncture) to improve how you feel.  Do not smoke.  Use a humidifier to keep the air in your house free of odors.  Get lots of fresh air. GET HELP IF:  You need medicine to feel better.  You feel dizzy or lightheaded.  You are losing weight. GET HELP RIGHT AWAY IF:   You feel very sick to your stomach and cannot stop throwing up.  You pass out (faint). MAKE SURE YOU:  Understand these instructions.  Will watch your condition.  Will get help right away if you are not doing well or get worse.   This information is not intended to replace advice given to you by your health care provider. Make sure you discuss any questions you have with your health care provider.   Document Released: 04/29/2004 Document Revised: 04/12/2014 Document Reviewed:  09/06/2012 Elsevier Interactive Patient Education [employer].

## 2015-10-04 NOTE — MAU Provider Note (Signed)
History     CSN: [id number]  Arrival date and time: 10/04/15 2027   First Provider Initiated Contact with Patient 10/04/15 2125      Chief Complaint  Patient presents with  . Morning Sickness  . Abdominal Cramping   HPI [name] is a 25 y.o. G3P1011 at [redacted]w[redacted]d who presents to MAU today with complaint of lower abdominal cramping since earlier tonight. The patient rates her pain at 2/10 now. She took Tylenol without relief. She denies vaginal bleeding, discharge or UTI symptoms. She has had frequent nausea without vomiting. She is not taking any anti-emetics. She states unsure LMP.   OB History    Gravida Para Term Preterm AB TAB SAB Ectopic Multiple Living   3 1 1  1  1   1       Past Medical History  Diagnosis Date  . Deaf   . UTI (lower urinary tract infection)   . Miscarriage     pt states had difficulty with high BP prior to miscarriage and afterwards till infection was healed  . Mass     cyst/left upper abd quad  . History of broken nose   . PONV (postoperative nausea and vomiting)     Past Surgical History  Procedure Laterality Date  . Tonsillectomy    . Cochlear implant    . Rhinoplasty    . Right foot  2016    cyst removed  . Laparoscopic splenectomy N/A 05/08/2015    Procedure: LAPAROSCOPIC SPLEEN SPARING AND DISTAL PANCRETECTOMY ;  Surgeon: Stark Klein, MD;  Location: [facility];  Service: General;  Laterality: N/A;    History reviewed. No pertinent family history.  Social History  Substance Use Topics  . Smoking status: Never Smoker   . Smokeless tobacco: Never Used  . Alcohol Use: No    Allergies:  Allergies  Allergen Reactions  . Codeine Itching and Rash    Prescriptions prior to admission  Medication Sig Dispense Refill Last Dose  . acetaminophen (TYLENOL) 325 MG tablet Take 2 tablets (650 mg total) by mouth every 6 (six) hours as needed for mild pain (or temp > 100). 100 tablet 0 10/04/2015 at Unknown time  . docusate sodium (COLACE) 100 MG  capsule Take 1 capsule (100 mg total) by mouth every 12 (twelve) hours. (Patient not taking: Reported on 09/22/2015) 60 capsule 0 Not Taking  . ibuprofen (ADVIL,MOTRIN) 600 MG tablet Take 1 tablet (600 mg total) by mouth 3 (three) times daily. (Patient not taking: Reported on 09/22/2015) 90 tablet 0 Not Taking  . methocarbamol (ROBAXIN) 500 MG tablet Take 1 tablet (500 mg total) by mouth every 6 (six) hours as needed for muscle spasms. (Patient not taking: Reported on 09/22/2015) 30 tablet 0 Not Taking  . ondansetron (ZOFRAN-ODT) 4 MG disintegrating tablet Take 4 mg by mouth every 6 (six) hours as needed for nausea or vomiting. Reported on 09/22/2015   Not Taking  . ondansetron (ZOFRAN-ODT) 4 MG disintegrating tablet Take 1 tablet (4 mg total) by mouth every 6 (six) hours as needed for nausea. (Patient not taking: Reported on 09/22/2015) 20 tablet 0 Not Taking  . oxyCODONE (OXY IR/ROXICODONE) 5 MG immediate release tablet Take 1-3 tablets (5-15 mg total) by mouth every 3 (three) hours as needed (pain). (Patient not taking: Reported on 09/22/2015) 80 tablet 0 Not Taking  . sulfamethoxazole-trimethoprim (BACTRIM DS,SEPTRA DS) 800-160 MG tablet Take 1 tablet by mouth every 12 (twelve) hours. (Patient not taking: Reported on 09/22/2015) 5 tablet  0 Not Taking    Review of Systems  Constitutional: Negative for fever and malaise/fatigue.  Gastrointestinal: Positive for nausea and abdominal pain. Negative for vomiting, diarrhea and constipation.  Genitourinary: Negative for dysuria, urgency and frequency.       Neg - vaginal bleeding, discharge   Physical Exam   Blood pressure 115/75, pulse 77, temperature 98.1 F (36.7 C), temperature source Oral, resp. rate 18, height 5\' 3"  (1.6 m), weight 150 lb 9.6 oz (68.312 kg), last menstrual period 08/18/2015.  Physical Exam  Nursing note and vitals reviewed. Constitutional: She is oriented to person, place, and time. She appears well-developed and well-nourished. No  distress.  HENT:  Head: Normocephalic and atraumatic.  Cardiovascular: Normal rate.   Respiratory: Effort normal.  GI: Soft. She exhibits no distension and no mass. There is no tenderness. There is no rebound and no guarding.  Neurological: She is alert and oriented to person, place, and time.  Skin: Skin is warm and dry. No erythema.  Psychiatric: She has a normal mood and affect.    Results for orders placed or performed during the hospital encounter of 10/04/15 (from the past 24 hour(s))  Urinalysis, Routine w reflex microscopic (not at [facility])     Status: None   Collection Time: 10/04/15  8:57 PM  Result Value Ref Range   Color, Urine YELLOW YELLOW   APPearance CLEAR CLEAR   Specific Gravity, Urine 1.020 1.005 - 1.030   pH 6.0 5.0 - 8.0   Glucose, UA NEGATIVE NEGATIVE mg/dL   Hgb urine dipstick NEGATIVE NEGATIVE   Bilirubin Urine NEGATIVE NEGATIVE   Ketones, ur NEGATIVE NEGATIVE mg/dL   Protein, ur NEGATIVE NEGATIVE mg/dL   Nitrite NEGATIVE NEGATIVE   Leukocytes, UA NEGATIVE NEGATIVE  Pregnancy, urine POC     Status: Abnormal   Collection Time: 10/04/15  9:07 PM  Result Value Ref Range   Preg Test, Ur POSITIVE (A) NEGATIVE  CBC with Differential/Platelet     Status: Abnormal   Collection Time: 10/04/15  9:30 PM  Result Value Ref Range   WBC 11.1 (H) 4.0 - 10.5 K/uL   RBC 4.21 3.87 - 5.11 MIL/uL   Hemoglobin 12.1 12.0 - 15.0 g/dL   HCT 35.7 (L) 36.0 - 46.0 %   MCV 84.8 78.0 - 100.0 fL   MCH 28.7 26.0 - 34.0 pg   MCHC 33.9 30.0 - 36.0 g/dL   RDW 13.3 11.5 - 15.5 %   Platelets 273 150 - 400 K/uL   Neutrophils Relative % 67 %   Neutro Abs 7.3 1.7 - 7.7 K/uL   Lymphocytes Relative 28 %   Lymphs Abs 3.1 0.7 - 4.0 K/uL   Monocytes Relative 3 %   Monocytes Absolute 0.4 0.1 - 1.0 K/uL   Eosinophils Relative 2 %   Eosinophils Absolute 0.2 0.0 - 0.7 K/uL   Basophils Relative 0 %   Basophils Absolute 0.0 0.0 - 0.1 K/uL  hCG, quantitative, pregnancy     Status: Abnormal    Collection Time: 10/04/15  9:30 PM  Result Value Ref Range   hCG, Beta Chain, Quant, S 54806 (H) <5 mIU/mL  Wet prep, genital     Status: Abnormal   Collection Time: 10/04/15  9:45 PM  Result Value Ref Range   Yeast Wet Prep HPF POC NONE SEEN NONE SEEN   Trich, Wet Prep NONE SEEN NONE SEEN   Clue Cells Wet Prep HPF POC NONE SEEN NONE SEEN   WBC, Wet Prep HPF  POC MODERATE (A) NONE SEEN   Sperm NONE SEEN    US Ob Comp Less 14 Wks  10/04/2015  CLINICAL DATA:  Cramping abdominal pain EXAM: OBSTETRIC <14 WK [place] AND TRANSVAGINAL OB US TECHNIQUE: Both transabdominal and transvaginal ultrasound examinations were performed for complete evaluation of the gestation as well as the maternal uterus, adnexal regions, and pelvic cul-de-sac. Transvaginal technique was performed to assess early pregnancy. COMPARISON:  None. FINDINGS: Intrauterine gestational sac: Single Yolk sac:  Yes Embryo:  Yes Cardiac Activity: Yes Heart Rate: 119  bpm MSD:   mm    w     d CRL: 6 point sec mm 6 w 4 d [place] EDC: 05/25/2016 Subchorionic hemorrhage: Small subchorionic hemorrhage measuring approximately 0.5 x 0.6 x 1.4 cm. Maternal uterus/adnexae: Normal.  No abnormal fluid collections. IMPRESSION: Single living intrauterine gestation measuring 6 weeks 4 days by crown-rump length. Small subchorionic hemorrhage. Electronically Signed   By: Andreas Newport M.D.   On: 10/04/2015 22:24   US Ob Transvaginal  10/04/2015  CLINICAL DATA:  Cramping abdominal pain EXAM: OBSTETRIC <14 WK [place] AND TRANSVAGINAL OB US TECHNIQUE: Both transabdominal and transvaginal ultrasound examinations were performed for complete evaluation of the gestation as well as the maternal uterus, adnexal regions, and pelvic cul-de-sac. Transvaginal technique was performed to assess early pregnancy. COMPARISON:  None. FINDINGS: Intrauterine gestational sac: Single Yolk sac:  Yes Embryo:  Yes Cardiac Activity: Yes Heart Rate: 119  bpm MSD:   mm    w     d CRL: 6 point sec mm 6  w 4 d [place] EDC: 05/25/2016 Subchorionic hemorrhage: Small subchorionic hemorrhage measuring approximately 0.5 x 0.6 x 1.4 cm. Maternal uterus/adnexae: Normal.  No abnormal fluid collections. IMPRESSION: Single living intrauterine gestation measuring 6 weeks 4 days by crown-rump length. Small subchorionic hemorrhage. Electronically Signed   By: Andreas Newport M.D.   On: 10/04/2015 22:24    MAU Course  Procedures None  MDM +UPT UA, wet prep, GC/chlamydia, CBC, quant hCG, HIV, RPR and [place] today to rule out ectopic pregnancy O+ blood type from previous visit in Epic   Assessment and Plan  A: SIUP at [redacted]w[redacted]d Small subchorionic hemorrhage, first trimester Abdominal pain in pregnancy Nausea without vomiting  P: Discharge home Rx for Phenergan given to patient Patient also requested instructions for Unisom/B6, discussed appropriate use First trimester/bleeding precautions and pelvic rest discussed Patient advised to follow-up with OB provider of choice to start prenatal care, patient plans to go to [facility] and has appointment 10/28/15 Pregnancy confirmation letter given with instructions for Pregnancy Medicaid Patient may return to MAU as needed or if her condition were to change or worsen   Luvenia Redden, PA-C  10/04/2015, 10:48 PM

## 2015-10-04 NOTE — MAU Note (Signed)
abd cramping for about 30 mins. Some nausea. No bleeding

## 2015-10-05 LAB — RPR: RPR: NONREACTIVE

## 2015-10-05 LAB — HIV ANTIBODY (ROUTINE TESTING W REFLEX): HIV SCREEN 4TH GENERATION: NONREACTIVE

## 2015-10-06 LAB — GC/CHLAMYDIA PROBE AMP (~~LOC~~) NOT AT ARMC
Chlamydia: NEGATIVE
Neisseria Gonorrhea: NEGATIVE

## 2015-10-27 ENCOUNTER — Emergency Department (HOSPITAL_COMMUNITY): Payer: Medicaid Other

## 2015-10-27 ENCOUNTER — Emergency Department (HOSPITAL_COMMUNITY)
Admission: EM | Admit: 2015-10-27 | Discharge: 2015-10-28 | Disposition: A | Discharge status: Home / Self Care | Payer: Medicaid Other | Attending: Emergency Medicine | Admitting: Emergency Medicine

## 2015-10-27 ENCOUNTER — Encounter (HOSPITAL_COMMUNITY): Payer: Self-pay | Admitting: Emergency Medicine

## 2015-10-27 DIAGNOSIS — R109 Unspecified abdominal pain: Secondary | ICD-10-CM

## 2015-10-27 DIAGNOSIS — Z79899 Other long term (current) drug therapy: Secondary | ICD-10-CM | POA: Diagnosis not present

## 2015-10-27 DIAGNOSIS — K5900 Constipation, unspecified: Secondary | ICD-10-CM | POA: Diagnosis not present

## 2015-10-27 DIAGNOSIS — Z3A10 10 weeks gestation of pregnancy: Secondary | ICD-10-CM | POA: Insufficient documentation

## 2015-10-27 DIAGNOSIS — R1011 Right upper quadrant pain: Secondary | ICD-10-CM | POA: Diagnosis present

## 2015-10-27 DIAGNOSIS — O26899 Other specified pregnancy related conditions, unspecified trimester: Secondary | ICD-10-CM

## 2015-10-27 DIAGNOSIS — O99611 Diseases of the digestive system complicating pregnancy, first trimester: Secondary | ICD-10-CM | POA: Insufficient documentation

## 2015-10-27 LAB — CBC WITH DIFFERENTIAL/PLATELET
BASOS ABS: 0 10*3/uL (ref 0.0–0.1)
BASOS PCT: 0 %
Eosinophils Absolute: 0.1 10*3/uL (ref 0.0–0.7)
Eosinophils Relative: 1 %
HEMATOCRIT: 30.4 % — AB (ref 36.0–46.0)
HEMOGLOBIN: 10.4 g/dL — AB (ref 12.0–15.0)
Lymphocytes Relative: 16 %
Lymphs Abs: 1.6 10*3/uL (ref 0.7–4.0)
MCH: 29.6 pg (ref 26.0–34.0)
MCHC: 34.2 g/dL (ref 30.0–36.0)
MCV: 86.6 fL (ref 78.0–100.0)
MONO ABS: 0.5 10*3/uL (ref 0.1–1.0)
Monocytes Relative: 5 %
NEUTROS ABS: 8.2 10*3/uL — AB (ref 1.7–7.7)
NEUTROS PCT: 78 %
Platelets: 199 10*3/uL (ref 150–400)
RBC: 3.51 MIL/uL — AB (ref 3.87–5.11)
RDW: 13.7 % (ref 11.5–15.5)
WBC: 10.4 10*3/uL (ref 4.0–10.5)

## 2015-10-27 LAB — COMPREHENSIVE METABOLIC PANEL
ALK PHOS: 32 U/L — AB (ref 38–126)
ALT: 9 U/L — ABNORMAL LOW (ref 14–54)
ANION GAP: 4 — AB (ref 5–15)
AST: 13 U/L — ABNORMAL LOW (ref 15–41)
Albumin: 3.1 g/dL — ABNORMAL LOW (ref 3.5–5.0)
BILIRUBIN TOTAL: 0.5 mg/dL (ref 0.3–1.2)
BUN: 6 mg/dL (ref 6–20)
CALCIUM: 7.4 mg/dL — AB (ref 8.9–10.3)
CO2: 21 mmol/L — ABNORMAL LOW (ref 22–32)
Chloride: 113 mmol/L — ABNORMAL HIGH (ref 101–111)
Creatinine, Ser: 0.38 mg/dL — ABNORMAL LOW (ref 0.44–1.00)
GFR calc non Af Amer: >60 mL/min (ref >60)
Glucose, Bld: 77 mg/dL (ref 65–99)
Potassium: 3.1 mmol/L — ABNORMAL LOW (ref 3.5–5.1)
SODIUM: 138 mmol/L (ref 135–145)
TOTAL PROTEIN: 5.9 g/dL — AB (ref 6.5–8.1)

## 2015-10-27 LAB — LIPASE, BLOOD: LIPASE: 19 U/L (ref 11–51)

## 2015-10-27 LAB — HCG, QUANTITATIVE, PREGNANCY: hCG, Beta Chain, Quant, S: 90672 m[IU]/mL — ABNORMAL HIGH (ref <5)

## 2015-10-27 LAB — CBG MONITORING, ED: GLUCOSE-CAPILLARY: 84 mg/dL (ref 65–99)

## 2015-10-27 MED ORDER — ACETAMINOPHEN 500 MG PO TABS
1000.0000 mg | ORAL_TABLET | Freq: Once | ORAL | Status: AC
  Administered 2015-10-27: 1000 mg via ORAL
  Filled 2015-10-27: qty 2

## 2015-10-27 NOTE — ED Notes (Signed)
Bed: WA09 Expected date:  Expected time:  Means of arrival:  Comments: Pancreatic cancer/abd pain

## 2015-10-27 NOTE — ED Triage Notes (Signed)
Pt comes to ed via ems c/o  Pain and syncope. Pt has a hx of pancreatitis/ with tumor removal. Pt is [redacted] weeks pregnant  Pt is at risk for gestational diabetes. Pt is deaf ( american sign language)  Pt was cleaning, started to feel lightheaded, dizzy, sharp pain in middle epigastric region, then spread. Pt had LOC ( pt was found on the floor ) at 6:00pm    Allergies codine ( itching)    pt has family in the room and sign language machine in room.

## 2015-10-27 NOTE — ED Provider Notes (Signed)
[facility] DEPT Provider Note   CSN: [id number] Arrival date & time: 10/27/15  2026  First Provider Contact:  None       History   Chief Complaint Chief Complaint  Patient presents with  . Abdominal Pain    HPI Diana Torres is a 25 y.o. female.  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 The patient's history was taken via sign language interpreter. The patient states that she was cleaning at home when she had the sudden onset of severe pain in her upper abdomen. She then says that she passed out and woke up on the floor. He per her mother the patient started complaining that she felt like she needed to have a bowel movement on the way to the hospital. On arrival to the hospital the patient passed a very large bowel movement and reported improvement in her symptoms. She reports that she had felt fine earlier in the day. She has had issues with constipation ever since having a pancreatic tumor removed in February. She denies chest pain or shortness of breath. No vaginal bleeding or complaints. Denies back pain. Denies nausea or vomiting. The patient is currently about [redacted] weeks pregnant. This is her third pregnancy. She had an ultrasound at about 6 weeks that confirmed an IUP.      Past Medical History:  Diagnosis Date  . Deaf   . History of broken nose   . Mass    cyst/left upper abd quad  . Miscarriage    pt states had difficulty with high BP prior to miscarriage and afterwards till infection was healed  . PONV (postoperative nausea and vomiting)   . UTI (lower urinary tract infection)     Patient Active Problem List   Diagnosis Date Noted  . Pancreatic mass 05/08/2015  . Postpartum endometritis 10/01/2012  . Borderline hypertension 09/20/2012  . Hearing impaired 08/14/2012    Past Surgical History:  Procedure Laterality Date  . COCHLEAR IMPLANT    . LAPAROSCOPIC SPLENECTOMY  N/A 05/08/2015   Procedure: LAPAROSCOPIC SPLEEN SPARING AND DISTAL PANCRETECTOMY ;  Surgeon: Stark Klein, MD;  Location: [facility];  Service: General;  Laterality: N/A;  . RHINOPLASTY    . right foot  2016   cyst removed  . TONSILLECTOMY      OB History    Gravida Para Term Preterm AB Living   3 1 1   1 1    SAB TAB Ectopic Multiple Live Births   1       1       Home Medications    Prior to Admission medications   Medication Sig Start Date End Date Taking? Authorizing Provider  acetaminophen (TYLENOL) 325 MG tablet Take 2 tablets (650 mg total) by mouth every 6 (six) hours as needed for mild pain (or temp > 100). 05/13/15   Stark Klein, MD  promethazine (PHENERGAN) 12.5 MG tablet Take 1 tablet (12.5 mg total) by mouth every 6 (six) hours as needed for nausea or vomiting. 10/04/15   Luvenia Redden, PA-C    Family History No family history on file.  Social History Social History  Substance Use Topics  . Smoking status: Never Smoker  . Smokeless tobacco: Never Used  . Alcohol use No     Allergies   Codeine   Review of Systems Review of Systems  Constitutional: Negative for appetite change, diaphoresis, fatigue and fever.  HENT: Negative  for congestion, postnasal drip, rhinorrhea and sinus pressure.   Eyes: Negative for visual disturbance.  Respiratory: Negative for cough, chest tightness and shortness of breath.   Cardiovascular: Negative for chest pain and palpitations.  Gastrointestinal: Positive for abdominal pain. Negative for diarrhea, nausea and vomiting.  Genitourinary: Negative for dysuria, hematuria and urgency.  Musculoskeletal: Negative for back pain, myalgias and neck pain.  Neurological: Positive for syncope. Negative for dizziness, seizures, facial asymmetry, light-headedness and numbness.  Hematological: Does not bruise/bleed easily.     Physical Exam Updated Vital Signs BP 100/66   Pulse 100   Temp 98.2 F (36.8 C) (Oral)   Resp 12   Ht 5\' 4"  (1.626  m)   Wt 148 lb (67.1 kg)   LMP 08/18/2015   SpO2 97%   BMI 25.40 kg/m   Physical Exam  Constitutional: She is oriented to person, place, and time. She appears well-developed and well-nourished. No distress.  HENT:  Head: Normocephalic and atraumatic.  Right Ear: External ear normal.  Left Ear: External ear normal.  Nose: Nose normal.  Mouth/Throat: Oropharynx is clear and moist. No oropharyngeal exudate.  Eyes: EOM are normal. Pupils are equal, round, and reactive to light.  Neck: Normal range of motion. Neck supple.  Cardiovascular: Normal rate, regular rhythm, normal heart sounds and intact distal pulses.   No murmur heard. Pulmonary/Chest: Effort normal. No respiratory distress. She has no wheezes. She has no rales.  Abdominal: Soft. She exhibits no distension. There is tenderness in the right upper quadrant and epigastric area. There is no rigidity, no rebound and no guarding.  Musculoskeletal: Normal range of motion. She exhibits no edema or tenderness.  Neurological: She is alert and oriented to person, place, and time.  Skin: Skin is warm and dry. No rash noted. She is not diaphoretic.  Vitals reviewed.    ED Treatments / Results  Labs (all labs ordered are listed, but only abnormal results are displayed) Labs Reviewed  CBC WITH DIFFERENTIAL/PLATELET - Abnormal; Notable for the following:       Result Value   RBC 3.51 (*)    Hemoglobin 10.4 (*)    HCT 30.4 (*)    Neutro Abs 8.2 (*)    All other components within normal limits  COMPREHENSIVE METABOLIC PANEL - Abnormal; Notable for the following:    Potassium 3.1 (*)    Chloride 113 (*)    CO2 21 (*)    Creatinine, Ser 0.38 (*)    Calcium 7.4 (*)    Total Protein 5.9 (*)    Albumin 3.1 (*)    AST 13 (*)    ALT 9 (*)    Alkaline Phosphatase 32 (*)    Anion gap 4 (*)    All other components within normal limits  URINALYSIS, ROUTINE W REFLEX MICROSCOPIC (NOT AT [facility]) - Abnormal; Notable for the following:     Leukocytes, UA TRACE (*)    All other components within normal limits  HCG, QUANTITATIVE, PREGNANCY - Abnormal; Notable for the following:    hCG, Beta Chain, Laqueta Carina 90,672 (*)    All other components within normal limits  URINE MICROSCOPIC-ADD ON - Abnormal; Notable for the following:    Squamous Epithelial / LPF 0-5 (*)    Bacteria, UA RARE (*)    All other components within normal limits  LIPASE, BLOOD  CBG MONITORING, ED    EKG  EKG Interpretation  Date/Time:  Monday October 27 2015 20:37:04 EDT Ventricular Rate:  75  PR Interval:    QRS Duration: 102 QT Interval:  379 QTC Calculation: 424 R Axis:   81 Text Interpretation:  Sinus rhythm No significant change since last tracing Confirmed by Charise Leinbach ([id number]) on 10/27/2015 9:11:50 PM       Radiology US Abdomen Limited Ruq  Result Date: 10/27/2015 CLINICAL DATA:  25 year old female with right upper quadrant abdominal pain EXAM: US ABDOMEN LIMITED - RIGHT UPPER QUADRANT COMPARISON:  CT dated 08/14/2015 FINDINGS: Gallbladder: No gallstones or wall thickening visualized. No sonographic Murphy sign noted by sonographer. Common bile duct: Diameter: 2 mm Liver: No focal lesion identified. Within normal limits in parenchymal echogenicity. IMPRESSION: Unremarkable right upper quadrant ultrasound. Electronically Signed   By: Anner Crete M.D.   On: 10/27/2015 23:44   Procedures Procedures (including critical care time)  Medications Ordered in ED Medications  potassium chloride SA (K-DUR,KLOR-CON) CR tablet 40 mEq (not administered)  acetaminophen (TYLENOL) tablet 1,000 mg (1,000 mg Oral Given 10/27/15 2217)  sodium chloride 0.9 % bolus 1,000 mL (1,000 mLs Intravenous New Bag/Given 10/28/15 0222)     Initial Impression / Assessment and Plan / ED Course  I have reviewed the triage vital signs and the nursing notes.  Pertinent labs & imaging results that were available during my care of the patient were reviewed by me and  considered in my medical decision making (see chart for details).  Clinical Course    Patient was seen and evaluated in stable condition. EKG unremarkable. Patient passed a large bowel movement just prior to my evaluation and reported feeling improved. Laboratory studies with mildly low bicarbonate and potassium but otherwise unremarkable. Potassium was repleted and patient was given IV fluids. Right upper quadrant abdominal ultrasound without acute process. Bedside ultrasound revealed an IUP with heart rate of 148. Discussed all results at length with patient and her arm mother and husband at bedside. They expressed understanding and agreement with plan for discharge and close outpatient follow-up. The patient has a follow-up appointment today with her OB/GYN which she said she will keep. She said she would discuss her symptoms and this episode from this evening with her OB/GYN. Strict return precautions given.  Final Clinical Impressions(s) / ED Diagnoses   Final diagnoses:  RUQ abdominal pain  Abdominal pain in pregnancy  Constipation, unspecified constipation type    New Prescriptions New Prescriptions   No medications on file     Harvel Quale, MD 10/28/15 [phone]

## 2015-10-27 NOTE — ED Notes (Signed)
Pt stated that she was unable to urinate at this time.

## 2015-10-28 ENCOUNTER — Ambulatory Visit (INDEPENDENT_AMBULATORY_CARE_PROVIDER_SITE_OTHER): Payer: Medicaid Other | Admitting: Medical

## 2015-10-28 ENCOUNTER — Other Ambulatory Visit (HOSPITAL_COMMUNITY)
Admission: RE | Admit: 2015-10-28 | Discharge: 2015-10-28 | Disposition: A | Discharge status: Home / Self Care | Payer: Medicaid Other | Source: Ambulatory Visit | Attending: Medical | Admitting: Medical

## 2015-10-28 ENCOUNTER — Encounter: Payer: Self-pay | Admitting: Medical

## 2015-10-28 VITALS — BP 117/66 | HR 92 | Wt 147.5 lb

## 2015-10-28 DIAGNOSIS — Z3490 Encounter for supervision of normal pregnancy, unspecified, unspecified trimester: Secondary | ICD-10-CM | POA: Insufficient documentation

## 2015-10-28 DIAGNOSIS — O219 Vomiting of pregnancy, unspecified: Secondary | ICD-10-CM

## 2015-10-28 DIAGNOSIS — Z01419 Encounter for gynecological examination (general) (routine) without abnormal findings: Secondary | ICD-10-CM | POA: Diagnosis not present

## 2015-10-28 DIAGNOSIS — Z3491 Encounter for supervision of normal pregnancy, unspecified, first trimester: Secondary | ICD-10-CM

## 2015-10-28 LAB — URINE MICROSCOPIC-ADD ON: RBC / HPF: NONE SEEN RBC/hpf (ref 0–5)

## 2015-10-28 LAB — URINALYSIS, ROUTINE W REFLEX MICROSCOPIC
BILIRUBIN URINE: NEGATIVE
GLUCOSE, UA: NEGATIVE mg/dL
HGB URINE DIPSTICK: NEGATIVE
KETONES UR: NEGATIVE mg/dL
Nitrite: NEGATIVE
PH: 6 (ref 5.0–8.0)
Protein, ur: NEGATIVE mg/dL
Specific Gravity, Urine: 1.022 (ref 1.005–1.030)

## 2015-10-28 LAB — POCT URINALYSIS DIP (DEVICE)
Bilirubin Urine: NEGATIVE
Glucose, UA: NEGATIVE mg/dL
KETONES UR: NEGATIVE mg/dL
Nitrite: NEGATIVE
PH: 6 (ref 5.0–8.0)
PROTEIN: NEGATIVE mg/dL
Specific Gravity, Urine: 1.02 (ref 1.005–1.030)
Urobilinogen, UA: 0.2 mg/dL (ref 0.0–1.0)

## 2015-10-28 MED ORDER — SODIUM CHLORIDE 0.9 % IV BOLUS (SEPSIS)
1000.0000 mL | Freq: Once | INTRAVENOUS | Status: AC
  Administered 2015-10-28: 1000 mL via INTRAVENOUS

## 2015-10-28 MED ORDER — METOCLOPRAMIDE HCL 5 MG PO TABS: 5.0000 mg | ORAL_TABLET | Freq: Three times a day (TID) | ORAL | Status: DC | PRN

## 2015-10-28 MED ORDER — POTASSIUM CHLORIDE CRYS ER 20 MEQ PO TBCR
40.0000 meq | EXTENDED_RELEASE_TABLET | Freq: Once | ORAL | Status: AC
  Administered 2015-10-28: 40 meq via ORAL
  Filled 2015-10-28: qty 2

## 2015-10-28 NOTE — Discharge Instructions (Signed)
You were seen and evaluated today for your abdominal pain and episode of passing out.  Likely the pain caused you to pass out and it appears the pain is likely related to constipation.  Please, discuss options for constipation treatment with your OBGYN today. Return with sudden return or worsening or symptoms.

## 2015-10-28 NOTE — Progress Notes (Signed)
  Subjective:  Diana Torres is a 25 y.o. G3P1011 at [redacted]w[redacted]d being seen today for initial prenatal care visit.  She is currently monitored for the following issues for this low-risk pregnancy and has Hearing impaired; Borderline hypertension; Postpartum endometritis; Pancreatic mass; and Supervision of low-risk pregnancy on her problem list.  Patient reports nausea.   . Vag. Bleeding: None.  Movement: Absent. Denies leaking of fluid.   The following portions of the patient's history were reviewed and updated as appropriate: allergies, current medications, past family history, past medical history, past social history, past surgical history and problem list. Problem list updated.  Objective:   Vitals:   10/28/15 0939  BP: 117/66  Pulse: 92  Weight: 147 lb 8 oz (66.9 kg)    Fetal Status:     Movement: Absent    unable to obtain FHTs with doppler due to early GA  General:  Alert, oriented and cooperative. Patient is in no acute distress.  Skin: Skin is warm and dry. No rash noted.   Cardiovascular: Normal heart rate noted  Respiratory: Normal respiratory effort, no problems with respiration noted  Abdomen: Soft, gravid, appropriate for gestational age. Pain/Pressure: Absent     Pelvic:  Cervical exam deferred       normal vaginal mucosa, normal discharge. Pap smear obtained. Small bleeding following pap only. Cervix - visually closed  Extremities: Normal range of motion.  Edema: None  Mental Status: Normal mood and affect. Normal behavior. Normal judgment and thought content.   Urinalysis:      Assessment and Plan:  Pregnancy: G3P1011 at [redacted]w[redacted]d  1. Supervision of low-risk pregnancy, first trimester - Culture, OB Urine - Cytology - PAP - Prenatal Profile - Hemoglobinopathy evaluation  2. Nausea and vomiting in pregnancy prior to [redacted] weeks gestation - metoCLOPramide (REGLAN) tablet 5 mg; Take 1 tablet (5 mg total) by mouth every 8 (eight) hours as needed for nausea.  first  trimester/bleeding precautions and general obstetric precautions including but not limited to vaginal bleeding, contractions, leaking of fluid and fetal movement were reviewed in detail with the patient. Please refer to After Visit Summary for other counseling recommendations.  Return in about 4 weeks (around 11/25/2015) for [place].   Luvenia Redden, PA-C

## 2015-10-28 NOTE — Patient Instructions (Signed)
Morning Sickness Morning sickness is when you feel sick to your stomach (nauseous) during pregnancy. You may feel sick to your stomach and throw up (vomit). You may feel sick in the morning, but you can feel this way any time of day. Some women feel very sick to their stomach and cannot stop throwing up (hyperemesis gravidarum). HOME CARE  Only take medicines as told by your doctor.  Take multivitamins as told by your doctor. Taking multivitamins before getting pregnant can stop or lessen the harshness of morning sickness.  Eat dry toast or unsalted crackers before getting out of bed.  Eat 5 to 6 small meals a day.  Eat dry and bland foods like rice and baked potatoes.  Do not drink liquids with meals. Drink between meals.  Do not eat greasy, fatty, or spicy foods.  Have someone cook for you if the smell of food causes you to feel sick or throw up.  If you feel sick to your stomach after taking prenatal vitamins, take them at night or with a snack.  Eat protein when you need a snack (nuts, yogurt, cheese).  Eat unsweetened gelatins for dessert.  Wear a bracelet used for sea sickness (acupressure wristband).  Go to a doctor that puts thin needles into certain body points (acupuncture) to improve how you feel.  Do not smoke.  Use a humidifier to keep the air in your house free of odors.  Get lots of fresh air. GET HELP IF:  You need medicine to feel better.  You feel dizzy or lightheaded.  You are losing weight. GET HELP RIGHT AWAY IF:   You feel very sick to your stomach and cannot stop throwing up.  You pass out (faint). MAKE SURE YOU:  Understand these instructions.  Will watch your condition.  Will get help right away if you are not doing well or get worse.   This information is not intended to replace advice given to you by your health care provider. Make sure you discuss any questions you have with your health care provider.   Document Released: 04/29/2004  Document Revised: 04/12/2014 Document Reviewed: 09/06/2012 Elsevier Interactive Patient Education 2016 [place] of Pregnancy The first trimester of pregnancy is from week 1 until the end of week 12 (months 1 through 3). During this time, your baby will begin to develop inside you. At 6-8 weeks, the eyes and face are formed, and the heartbeat can be seen on ultrasound. At the end of 12 weeks, all the baby's organs are formed. Prenatal care is all the medical care you receive before the birth of your baby. Make sure you get good prenatal care and follow all of your doctor's instructions. HOME CARE  Medicines  Take medicine only as told by your doctor. Some medicines are safe and some are not during pregnancy.  Take your prenatal vitamins as told by your doctor.  Take medicine that helps you poop (stool softener) as needed if your doctor says it is okay. Diet  Eat regular, healthy meals.  Your doctor will tell you the amount of weight gain that is right for you.  Avoid raw meat and uncooked cheese.  If you feel sick to your stomach (nauseous) or throw up (vomit):  Eat 4 or 5 small meals a day instead of 3 large meals.  Try eating a few soda crackers.  Drink liquids between meals instead of during meals.  If you have a hard time pooping (constipation):  Eat high-fiber  foods like fresh vegetables, fruit, and whole grains.  Drink enough fluids to keep your pee (urine) clear or pale yellow. Activity and Exercise  Exercise only as told by your doctor. Stop exercising if you have cramps or pain in your lower belly (abdomen) or low back.  Try to avoid standing for long periods of time. Move your legs often if you must stand in one place for a long time.  Avoid heavy lifting.  Wear low-heeled shoes. Sit and stand up straight.  You can have sex unless your doctor tells you not to. Relief of Pain or Discomfort  Wear a good support bra if your breasts are  sore.  Take warm water baths (sitz baths) to soothe pain or discomfort caused by hemorrhoids. Use hemorrhoid cream if your doctor says it is okay.  Rest with your legs raised if you have leg cramps or low back pain.  Wear support hose if you have puffy, bulging veins (varicose veins) in your legs. Raise (elevate) your feet for 15 minutes, 3-4 times a day. Limit salt in your diet. Prenatal Care  Schedule your prenatal visits by the twelfth week of pregnancy.  Write down your questions. Take them to your prenatal visits.  Keep all your prenatal visits as told by your doctor. Safety  Wear your seat belt at all times when driving.  Make a list of emergency phone numbers. The list should include numbers for family, friends, the hospital, and police and fire departments. General Tips  Ask your doctor for a referral to a local prenatal class. Begin classes no later than at the start of month 6 of your pregnancy.  Ask for help if you need counseling or help with nutrition. Your doctor can give you advice or tell you where to go for help.  Do not use hot tubs, steam rooms, or saunas.  Do not douche or use tampons or scented sanitary pads.  Do not cross your legs for long periods of time.  Avoid litter boxes and soil used by cats.  Avoid all smoking, herbs, and alcohol. Avoid drugs not approved by your doctor.  Do not use any tobacco products, including cigarettes, chewing tobacco, and electronic cigarettes. If you need help quitting, ask your doctor. You may get counseling or other support to help you quit.  Visit your dentist. At home, brush your teeth with a soft toothbrush. Be gentle when you floss. GET HELP IF:  You are dizzy.  You have mild cramps or pressure in your lower belly.  You have a nagging pain in your belly area.  You continue to feel sick to your stomach, throw up, or have watery poop (diarrhea).  You have a bad smelling fluid coming from your vagina.  You  have pain with peeing (urination).  You have increased puffiness (swelling) in your face, hands, legs, or ankles. GET HELP RIGHT AWAY IF:   You have a fever.  You are leaking fluid from your vagina.  You have spotting or bleeding from your vagina.  You have very bad belly cramping or pain.  You gain or lose weight rapidly.  You throw up blood. It may look like coffee grounds.  You are around people who have [place] measles, fifth disease, or chickenpox.  You have a very bad headache.  You have shortness of breath.  You have any kind of trauma, such as from a fall or a car accident.   This information is not intended to replace advice given to  you by your health care provider. Make sure you discuss any questions you have with your health care provider.   Document Released: 09/08/2007 Document Revised: 04/12/2014 Document Reviewed: 01/30/2013 Elsevier Interactive Patient Education [employer].

## 2015-10-29 ENCOUNTER — Emergency Department (HOSPITAL_COMMUNITY)
Admission: EM | Admit: 2015-10-29 | Discharge: 2015-10-29 | Disposition: A | Discharge status: Home / Self Care | Payer: Medicaid Other | Attending: Emergency Medicine | Admitting: Emergency Medicine

## 2015-10-29 ENCOUNTER — Encounter (HOSPITAL_COMMUNITY): Payer: Self-pay

## 2015-10-29 DIAGNOSIS — L0100 Impetigo, unspecified: Secondary | ICD-10-CM | POA: Insufficient documentation

## 2015-10-29 DIAGNOSIS — Z3A10 10 weeks gestation of pregnancy: Secondary | ICD-10-CM | POA: Insufficient documentation

## 2015-10-29 DIAGNOSIS — O99711 Diseases of the skin and subcutaneous tissue complicating pregnancy, first trimester: Secondary | ICD-10-CM | POA: Diagnosis not present

## 2015-10-29 DIAGNOSIS — O9989 Other specified diseases and conditions complicating pregnancy, childbirth and the puerperium: Secondary | ICD-10-CM | POA: Diagnosis present

## 2015-10-29 LAB — PRENATAL PROFILE (SOLSTAS)
ANTIBODY SCREEN: NEGATIVE
BASOS PCT: 0 %
Basophils Absolute: 0 cells/uL (ref 0–200)
EOS ABS: 174 {cells}/uL (ref 15–500)
Eosinophils Relative: 2 %
HCT: 34.1 % — ABNORMAL LOW (ref 35.0–45.0)
HIV: NONREACTIVE
Hemoglobin: 11.4 g/dL — ABNORMAL LOW (ref 11.7–15.5)
Hepatitis B Surface Ag: NEGATIVE
Lymphocytes Relative: 17 %
Lymphs Abs: 1479 cells/uL (ref 850–3900)
MCH: 28.6 pg (ref 27.0–33.0)
MCHC: 33.4 g/dL (ref 32.0–36.0)
MCV: 85.5 fL (ref 80.0–100.0)
MONO ABS: 609 {cells}/uL (ref 200–950)
MPV: 10.5 fL (ref 7.5–12.5)
Monocytes Relative: 7 %
NEUTROS ABS: 6438 {cells}/uL (ref 1500–7800)
Neutrophils Relative %: 74 %
PLATELETS: 219 10*3/uL (ref 140–400)
RBC: 3.99 MIL/uL (ref 3.80–5.10)
RDW: 13.8 % (ref 11.0–15.0)
Rh Type: POSITIVE
Rubella: 2.9 Index — ABNORMAL HIGH (ref <0.90)
WBC: 8.7 10*3/uL (ref 3.8–10.8)

## 2015-10-29 LAB — CULTURE, OB URINE

## 2015-10-29 LAB — CYTOLOGY - PAP

## 2015-10-29 MED ORDER — AMOXICILLIN-POT CLAVULANATE 875-125 MG PO TABS: 1.0000 | ORAL_TABLET | Freq: Two times a day (BID) | ORAL | 0 refills | Status: DC

## 2015-10-29 NOTE — ED Triage Notes (Signed)
Patient here with left upper lip swelling x 2 days, denies trauma, also complains of sore throat with same. No distress, alert and oriented. Per interpretor states that she is [redacted] weeks pregnant.

## 2015-10-29 NOTE — Discharge Instructions (Signed)
Take the prescribed medication as directed.  May wish to do warm compresses at home as well. Follow-up with your primary care doctor. Return to the ED for new or worsening symptoms.

## 2015-10-29 NOTE — ED Provider Notes (Signed)
[facility] DEPT Provider Note   CSN: [id number] Arrival date & time: 10/29/15  1049  First Provider Contact:  First MD Initiated Contact with Patient 10/29/15 1201        History   Chief Complaint No chief complaint on file.   HPI Diana Torres is a 25 y.o. female.  The history is provided by the patient and a significant other. The history is limited by a language barrier. A language interpreter was used.  history obtained via sign language interpreter.  25 y.o. F G3P1 approx [redacted] weeks gestation with known IUP hx of congenital deafness, presenting to the ED for left upper lip swelling.  States this started yesterday but got worse today.  She has a small yellow area on her left upper lip as well but denies drainage.  No new make-up, soaps, or other personal care products.  No chest pain or SOB.  No sensation of throat closing or difficulty swallowing.  States she has tried using some over the counter witch hazel, neosporin, and warm compresses without relief.  She denies fever, chills, or sweats.  States her lip feels "sore" and there is somewhat of a burning sensation in her lip.  She has no hx of MRSA, HIV, or DM.  Patient is [redacted] weeks pregnant.  She has a OB-GYN that she sees for her prenatal care.  No current abdominal pain, vaginal bleeding, or pelvic pain.  VSS.  Past Medical History:  Diagnosis Date  . Deaf   . History of broken nose   . Mass    cyst/left upper abd quad  . Miscarriage    pt states had difficulty with high BP prior to miscarriage and afterwards till infection was healed  . PONV (postoperative nausea and vomiting)   . UTI (lower urinary tract infection)     Patient Active Problem List   Diagnosis Date Noted  . Supervision of low-risk pregnancy 10/28/2015  . Pancreatic mass 05/08/2015  . Postpartum endometritis 10/01/2012  . Borderline hypertension 09/20/2012  . Hearing impaired 08/14/2012    Past Surgical History:  Procedure Laterality Date  .  COCHLEAR IMPLANT    . 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 RHINOPLASTY    . right foot  2016   cyst removed  . TONSILLECTOMY      OB History    Gravida Para Term Preterm AB Living   3 1 1   1 1    SAB TAB Ectopic Multiple Live Births   1       1       Home Medications    Prior to Admission medications   Medication Sig Start Date End Date Taking? Authorizing Provider  acetaminophen (TYLENOL) 325 MG tablet Take 2 tablets (650 mg total) by mouth every 6 (six) hours as needed for mild pain (or temp > 100). Patient not taking: Reported on 10/28/2015 05/13/15   Stark Klein, MD  doxylamine, Sleep, (UNISOM) 25 MG tablet Take 25 mg by mouth at bedtime as needed.    Historical Provider, MD  prenatal vitamin w/FE, FA (PRENATAL 1 + 1) 27-1 MG TABS tablet Take 1 tablet by mouth daily at 12 noon.    Historical Provider, MD  promethazine (PHENERGAN) 12.5 MG tablet Take 1 tablet (12.5 mg total) by mouth every 6 (six) hours as needed for nausea or vomiting. Patient not  taking: Reported on 10/28/2015 10/04/15   Luvenia Redden, PA-C    Family History Family History  Problem Relation Age of Onset  . Hearing loss Mother   . Hypertension Mother   . Alcohol abuse Father   . Hearing loss Father     Social History Social History  Substance Use Topics  . Smoking status: Never Smoker  . Smokeless tobacco: Never Used  . Alcohol use No     Allergies   Codeine   Review of Systems Review of Systems  HENT: Positive for facial swelling (upper lip).   All other systems reviewed and are negative.    Physical Exam Updated Vital Signs BP 125/92 (BP Location: Right Arm)   Pulse 96   Temp 98.4 F (36.9 C) (Oral)   Resp 19   Ht 5\' 4"  (1.626 m)   Wt 66.7 kg   LMP 08/18/2015   SpO2 99%   BMI 25.23 kg/m   Physical Exam  Constitutional:  She is oriented to person, place, and time. She appears well-developed and well-nourished.  HENT:  Head: Normocephalic and atraumatic.  Mouth/Throat: Oropharynx is clear and moist.  Left upper lip appears swollen with area of honey colored crusting and drainage noted; no fluctuance or abscess formation oropharynx is clear, no tongue or uvula swelling, handling secretions well No lymphadenopathy No swelling of the cheeks or neck  Eyes: Conjunctivae and EOM are normal. Pupils are equal, round, and reactive to light.  Neck: Normal range of motion.  Cardiovascular: Normal rate, regular rhythm and normal heart sounds.   Pulmonary/Chest: Effort normal and breath sounds normal. She has no wheezes. She has no rhonchi.  Respirations unlabored, no distress  Abdominal: Soft. Bowel sounds are normal.  Musculoskeletal: Normal range of motion.  Neurological: She is alert and oriented to person, place, and time.  Skin: Skin is warm and dry.  Psychiatric: She has a normal mood and affect.  Nursing note and vitals reviewed.    ED Treatments / Results  Labs (all labs ordered are listed, but only abnormal results are displayed) Labs Reviewed - No data to display  EKG  EKG Interpretation None       Radiology   Procedures Procedures (including critical care time)  Medications Ordered in ED Medications - No data to display   Initial Impression / Assessment and Plan / ED Course  I have reviewed the triage vital signs and the nursing notes.  Pertinent labs & imaging results that were available during my care of the patient were reviewed by me and considered in my medical decision making (see chart for details).  Clinical Course   25 year old female here with left upper lip swelling which began yesterday and worsened today. She is afebrile and nontoxic. She is in no acute respiratory distress. She denies any new soaps, makeup, or other personal care products. She does have swelling of the  left upper lip with some honey-colored crusting noted. There is some drainage on palpation. There is no fluctuance or definitive abscess at this time. Her airway is clear and she is handling her secretions well. Denies any chest pain or shortness of breath. This does not seem to be an allergic type reaction, more clinically consistent with impetigo. She will be started on Augmentin and instructed to follow-up with her primary care doctor.  Discussed plan with patient via sign language interpreter, she acknowledged understanding and agreed with plan of care.  Return precautions given for new or worsening symptoms.  Final  Clinical Impressions(s) / ED Diagnoses   Final diagnoses:  Impetigo    New Prescriptions New Prescriptions   AMOXICILLIN-CLAVULANATE (AUGMENTIN) 875-125 MG TABLET    Take 1 tablet by mouth every 12 (twelve) hours.     Larene Pickett, PA-C 10/29/15 [place], MD 10/29/15 [phone]

## 2015-11-26 ENCOUNTER — Encounter: Payer: Medicaid Other | Admitting: Obstetrics and Gynecology

## 2015-12-10 ENCOUNTER — Ambulatory Visit (INDEPENDENT_AMBULATORY_CARE_PROVIDER_SITE_OTHER): Payer: Medicaid Other | Admitting: Student

## 2015-12-10 VITALS — BP 115/78 | HR 93 | Wt 149.7 lb

## 2015-12-10 DIAGNOSIS — Z3492 Encounter for supervision of normal pregnancy, unspecified, second trimester: Secondary | ICD-10-CM

## 2015-12-10 LAB — POCT URINALYSIS DIP (DEVICE)
BILIRUBIN URINE: NEGATIVE
Glucose, UA: NEGATIVE mg/dL
HGB URINE DIPSTICK: NEGATIVE
Ketones, ur: NEGATIVE mg/dL
NITRITE: NEGATIVE
PH: 7 (ref 5.0–8.0)
Protein, ur: NEGATIVE mg/dL
SPECIFIC GRAVITY, URINE: 1.015 (ref 1.005–1.030)
UROBILINOGEN UA: 0.2 mg/dL (ref 0.0–1.0)

## 2015-12-10 NOTE — Patient Instructions (Signed)

## 2015-12-10 NOTE — Progress Notes (Signed)
   PRENATAL VISIT NOTE  Subjective:  Diana Torres is a 25 y.o. G3P1011 at [redacted]w[redacted]d being seen today for ongoing prenatal care.  She is currently monitored for the following issues for this low-risk pregnancy and has Hearing impaired; Borderline hypertension; Postpartum endometritis; Pancreatic mass; and Supervision of low-risk pregnancy on her problem list.  Patient reports no complaints.  Contractions: Not present. Vag. Bleeding: None.  Movement: Present. Denies leaking of fluid.  ASL interpreter at bedside.  The following portions of the patient's history were reviewed and updated as appropriate: allergies, current medications, past family history, past medical history, past social history, past surgical history and problem list. Problem list updated.  Objective:   Vitals:   12/10/15 0836  BP: 115/78  Pulse: 93  Weight: 149 lb 11.2 oz (67.9 kg)    Fetal Status: Fetal Heart Rate (bpm): 145   Movement: Present     General:  Alert, oriented and cooperative. Patient is in no acute distress.  Skin: Skin is warm and dry. No rash noted.   Cardiovascular: Normal heart rate noted  Respiratory: Normal respiratory effort, no problems with respiration noted  Abdomen: Soft, gravid, appropriate for gestational age. Pain/Pressure: Present     Pelvic:  Cervical exam deferred        Extremities: Normal range of motion.  Edema: None  Mental Status: Normal mood and affect. Normal behavior. Normal judgment and thought content.   Urinalysis:      Assessment and Plan:  Pregnancy: G3P1011 at 108w2d  1. Supervision of low-risk pregnancy, second trimester  - [place] MFM OB COMP + 14 WK; Future - Hemoglobinopathy Evaluation  Preterm labor symptoms and general obstetric precautions including but not limited to vaginal bleeding, contractions, leaking of fluid and fetal movement were reviewed in detail with the patient. Please refer to After Visit Summary for other counseling recommendations.  Return in  about 4 weeks (around 01/07/2016) for Routine OB.  Jorje Guild, NP

## 2015-12-12 LAB — HEMOGLOBINOPATHY EVALUATION
HEMATOCRIT: 35.9 % (ref 35.0–45.0)
HGB A: 96.7 % (ref >96.0)
Hemoglobin: 11.7 g/dL (ref 11.7–15.5)
Hgb A2 Quant: 2.3 % (ref 1.8–3.5)
MCH: 28.6 pg (ref 27.0–33.0)
MCV: 87.8 fL (ref 80.0–100.0)
RBC: 4.09 MIL/uL (ref 3.80–5.10)
RDW: 14 % (ref 11.0–15.0)

## 2015-12-23 ENCOUNTER — Encounter (HOSPITAL_COMMUNITY): Payer: Self-pay | Admitting: Student

## 2015-12-30 ENCOUNTER — Ambulatory Visit (HOSPITAL_COMMUNITY)
Admission: RE | Admit: 2015-12-30 | Discharge: 2015-12-30 | Disposition: A | Discharge status: Home / Self Care | Payer: Medicaid Other | Source: Ambulatory Visit | Attending: Student | Admitting: Student

## 2015-12-30 ENCOUNTER — Other Ambulatory Visit: Payer: Self-pay | Admitting: Student

## 2015-12-30 DIAGNOSIS — Z36 Encounter for antenatal screening of mother: Secondary | ICD-10-CM | POA: Diagnosis present

## 2015-12-30 DIAGNOSIS — Z3A19 19 weeks gestation of pregnancy: Secondary | ICD-10-CM | POA: Insufficient documentation

## 2015-12-30 DIAGNOSIS — Z3492 Encounter for supervision of normal pregnancy, unspecified, second trimester: Secondary | ICD-10-CM

## 2016-01-07 ENCOUNTER — Ambulatory Visit (INDEPENDENT_AMBULATORY_CARE_PROVIDER_SITE_OTHER): Payer: Medicaid Other | Admitting: Certified Nurse Midwife

## 2016-01-07 VITALS — BP 130/76 | HR 97 | Wt 159.0 lb

## 2016-01-07 DIAGNOSIS — Z3492 Encounter for supervision of normal pregnancy, unspecified, second trimester: Secondary | ICD-10-CM

## 2016-01-07 DIAGNOSIS — Z23 Encounter for immunization: Secondary | ICD-10-CM

## 2016-01-07 NOTE — Progress Notes (Signed)
Diana Torres ASL interpreter used for today's visit

## 2016-01-07 NOTE — Progress Notes (Signed)
Subjective:  Diana Torres is a 25 y.o. G3P1011 at [redacted]w[redacted]d being seen today for ongoing prenatal care.  She is currently monitored for the following issues for this low-risk pregnancy and has Hearing impaired; Pancreatic mass; and Supervision of low-risk pregnancy on her problem list.  Patient reports sharp lower abd pain with standing and quick movements.  Contractions: Not present. Vag. Bleeding: None.  Movement: Present. Denies leaking of fluid.   The following portions of the patient's history were reviewed and updated as appropriate: allergies, current medications, past family history, past medical history, past social history, past surgical history and problem list. Problem list updated.  Objective:   Vitals:   01/07/16 0855  BP: 130/76  Pulse: 97  Weight: 159 lb (72.1 kg)    Fetal Status: Fetal Heart Rate (bpm): 141   Movement: Present     General:  Alert, oriented and cooperative. Patient is in no acute distress.  Skin: Skin is warm and dry. No rash noted.   Cardiovascular: Normal heart rate noted  Respiratory: Normal respiratory effort, no problems with respiration noted  Abdomen: Soft, gravid, appropriate for gestational age. Pain/Pressure: Present     Pelvic: Vag. Bleeding: None     Cervical exam deferred        Extremities: Normal range of motion.  Edema: None  Mental Status: Normal mood and affect. Normal behavior. Normal judgment and thought content.   Urinalysis:      Assessment and Plan:  Pregnancy: G3P1011 at [redacted]w[redacted]d  1. Encounter for supervision of low-risk pregnancy in second trimester  - [place] MFM OB FOLLOW UP; Future (incomplete anatomy) - Flu Vaccine QUAD 36+ mos IM  2.  Round ligament pain - position change slower - reassurance nml in pregnancy  Preterm labor symptoms and general obstetric precautions including but not limited to vaginal bleeding, contractions, leaking of fluid and fetal movement were reviewed in detail with the patient. Please refer to  After Visit Summary for other counseling recommendations.  Return in about 4 weeks (around 02/04/2016).   Julianne Handler, CNM

## 2016-01-20 ENCOUNTER — Inpatient Hospital Stay (HOSPITAL_COMMUNITY)
Admission: AD | Admit: 2016-01-20 | Discharge: 2016-01-20 | Disposition: A | Discharge status: Home / Self Care | Payer: Medicaid Other | Source: Ambulatory Visit | Attending: Obstetrics & Gynecology | Admitting: Obstetrics & Gynecology

## 2016-01-20 ENCOUNTER — Encounter (HOSPITAL_COMMUNITY): Payer: Self-pay

## 2016-01-20 DIAGNOSIS — M791 Myalgia: Secondary | ICD-10-CM

## 2016-01-20 DIAGNOSIS — O9989 Other specified diseases and conditions complicating pregnancy, childbirth and the puerperium: Secondary | ICD-10-CM | POA: Diagnosis not present

## 2016-01-20 DIAGNOSIS — M5441 Lumbago with sciatica, right side: Secondary | ICD-10-CM | POA: Diagnosis not present

## 2016-01-20 DIAGNOSIS — M7918 Myalgia, other site: Secondary | ICD-10-CM

## 2016-01-20 DIAGNOSIS — Z3A22 22 weeks gestation of pregnancy: Secondary | ICD-10-CM | POA: Diagnosis not present

## 2016-01-20 DIAGNOSIS — N898 Other specified noninflammatory disorders of vagina: Secondary | ICD-10-CM | POA: Insufficient documentation

## 2016-01-20 DIAGNOSIS — M5431 Sciatica, right side: Secondary | ICD-10-CM

## 2016-01-20 DIAGNOSIS — O26892 Other specified pregnancy related conditions, second trimester: Secondary | ICD-10-CM | POA: Insufficient documentation

## 2016-01-20 DIAGNOSIS — Z3492 Encounter for supervision of normal pregnancy, unspecified, second trimester: Secondary | ICD-10-CM

## 2016-01-20 DIAGNOSIS — R109 Unspecified abdominal pain: Secondary | ICD-10-CM | POA: Diagnosis present

## 2016-01-20 LAB — URINALYSIS, ROUTINE W REFLEX MICROSCOPIC
Bilirubin Urine: NEGATIVE
GLUCOSE, UA: NEGATIVE mg/dL
Hgb urine dipstick: NEGATIVE
KETONES UR: NEGATIVE mg/dL
LEUKOCYTES UA: NEGATIVE
NITRITE: NEGATIVE
Protein, ur: NEGATIVE mg/dL
Specific Gravity, Urine: 1.01 (ref 1.005–1.030)
pH: 6 (ref 5.0–8.0)

## 2016-01-20 LAB — WET PREP, GENITAL
CLUE CELLS WET PREP: NONE SEEN
Sperm: NONE SEEN
TRICH WET PREP: NONE SEEN
Yeast Wet Prep HPF POC: NONE SEEN

## 2016-01-20 NOTE — MAU Provider Note (Signed)
History     CSN: [id number]  Arrival date and time: 01/20/16 1825   None     No chief complaint on file.  G3P1011 @22 .1 weeks here with c/o abdominal tightening and pelvic pressure x2 days. She also c/o bilateral lower back pains. She also c/o intermittent sharp shooting pain in her RLQ that radiates down her right leg. She denies VB and ctx. She also reports a thin watery discharge over the last few days. No urinary sx. She states heat improves the pain.    OB History    Gravida Para Term Preterm AB Living   3 1 1   1 1    SAB TAB Ectopic Multiple Live Births   1       1      Past Medical History:  Diagnosis Date  . Deaf   . History of broken nose   . Mass    cyst/left upper abd quad  . Miscarriage    pt states had difficulty with high BP prior to miscarriage and afterwards till infection was healed  . PONV (postoperative nausea and vomiting)   . UTI (lower urinary tract infection)     Past Surgical History:  Procedure Laterality Date  . COCHLEAR IMPLANT    . 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 RHINOPLASTY    . right foot  2016   cyst removed  . TONSILLECTOMY      Family History  Problem Relation Age of Onset  . Hearing loss Mother   . Hypertension Mother   . Alcohol abuse Father   . Hearing loss Father     Social History  Substance Use Topics  . Smoking status: Never Smoker  . Smokeless tobacco: Never Used  . Alcohol use No    Allergies:  Allergies  Allergen Reactions  . Codeine Itching and Rash    Facility-Administered Medications Prior to Admission  Medication Dose Route Frequency Provider Last Rate Last Dose  . metoCLOPramide (REGLAN) tablet 5 mg  5 mg Oral Q8H PRN Luvenia Redden, PA-C       Prescriptions Prior to Admission  Medication Sig Dispense Refill Last Dose  . prenatal vitamin  w/FE, FA (PRENATAL 1 + 1) 27-1 MG TABS tablet Take 1 tablet by mouth daily at 12 noon.   Taking    Review of Systems  Constitutional: Negative.   Gastrointestinal: Positive for abdominal pain. Negative for constipation, diarrhea, nausea and vomiting.  Genitourinary: Negative.   Musculoskeletal: Positive for back pain.   Physical Exam   Blood pressure 103/66, pulse 70, temperature 98.2 F (36.8 C), temperature source Oral, resp. rate 16, last menstrual period 08/18/2015.  Physical Exam  Constitutional: She is oriented to person, place, and time. She appears well-developed and well-nourished.  HENT:  Head: Normocephalic and atraumatic.  Neck: Normal range of motion. Neck supple.  Cardiovascular: Normal rate.   Respiratory: Effort normal.  GI: Soft. She exhibits no distension. There is no tenderness. There is no CVA tenderness.  gravid  Genitourinary:  Genitourinary Comments: External: no lesions Vagina: rugated, parous, thick white discharge, no pool, fern neg SVE: closed, thick   Musculoskeletal: Normal range of motion.       Lumbar back: She exhibits tenderness. She exhibits normal range of motion, no bony tenderness, no swelling and no deformity.  Neurological: She is alert and oriented  to person, place, and time.  Skin: Skin is warm and dry.  Psychiatric: She has a normal mood and affect.   FHT: 149 bpm Results for orders placed or performed during the hospital encounter of 01/20/16 (from the past 24 hour(s))  Urinalysis, Routine w reflex microscopic (not at [facility])     Status: None   Collection Time: 01/20/16  6:40 PM  Result Value Ref Range   Color, Urine YELLOW YELLOW   APPearance CLEAR CLEAR   Specific Gravity, Urine 1.010 1.005 - 1.030   pH 6.0 5.0 - 8.0   Glucose, UA NEGATIVE NEGATIVE mg/dL   Hgb urine dipstick NEGATIVE NEGATIVE   Bilirubin Urine NEGATIVE NEGATIVE   Ketones, ur NEGATIVE NEGATIVE mg/dL   Protein, ur NEGATIVE NEGATIVE mg/dL   Nitrite NEGATIVE  NEGATIVE   Leukocytes, UA NEGATIVE NEGATIVE  Wet prep, genital     Status: Abnormal   Collection Time: 01/20/16  7:35 PM  Result Value Ref Range   Yeast Wet Prep HPF POC NONE SEEN NONE SEEN   Trich, Wet Prep NONE SEEN NONE SEEN   Clue Cells Wet Prep HPF POC NONE SEEN NONE SEEN   WBC, Wet Prep HPF POC MANY (A) NONE SEEN   Sperm NONE SEEN     MAU Course  Procedures  MDM Labs ordered and reviewed. No evidence of UTI, infection, PROM, or PTL. Pain likely physiologic to advancing pregnancy. Stable for discharge home.  Assessment and Plan   1. Musculoskeletal pain   2. Second trimester pregnancy   3. Sciatica of right side   4. Leukorrhea    Heating pad Warm baths Tylenol Maternity support belt  Julianne Handler, CNM 01/20/2016, 7:58 PM

## 2016-01-20 NOTE — Discharge Instructions (Signed)
Round Ligament Pain The round ligament is a cord of muscle and tissue that helps to support the uterus. It can become a source of pain during pregnancy if it becomes stretched or twisted as the baby grows. The pain usually begins in the second trimester of pregnancy, and it can come and go until the baby is delivered. It is not a serious problem, and it does not cause harm to the baby. Round ligament pain is usually a short, sharp, and pinching pain, but it can also be a dull, lingering, and aching pain. The pain is felt in the lower side of the abdomen or in the groin. It usually starts deep in the groin and moves up to the outside of the hip area. Pain can occur with:  A sudden change in position.  Rolling over in bed.  Coughing or sneezing.  Physical activity. HOME CARE INSTRUCTIONS Watch your condition for any changes. Take these steps to help with your pain:  When the pain starts, relax. Then try:  Sitting down.  Flexing your knees up to your abdomen.  Lying on your side with one pillow under your abdomen and another pillow between your legs.  Sitting in a warm bath for 15-20 minutes or until the pain goes away.  Take over-the-counter and prescription medicines only as told by your health care provider.  Move slowly when you sit and stand.  Avoid long walks if they cause pain.  Stop or lessen your physical activities if they cause pain. SEEK MEDICAL CARE IF:  Your pain does not go away with treatment.  You feel pain in your back that you did not have before.  Your medicine is not helping. SEEK IMMEDIATE MEDICAL CARE IF:  You develop a fever or chills.  You develop uterine contractions.  You develop vaginal bleeding.  You develop nausea or vomiting.  You develop diarrhea.  You have pain when you urinate.   This information is not intended to replace advice given to you by your health care provider. Make sure you discuss any questions you have with your health  care provider.   Document Released: 12/30/2007 Document Revised: 06/14/2011 Document Reviewed: 05/29/2014 Elsevier Interactive Patient Education 2016 Elsevier Inc. Sciatica Sciatica is pain, weakness, numbness, or tingling along the path of the sciatic nerve. The nerve starts in the lower back and runs down the back of each leg. The nerve controls the muscles in the lower leg and in the back of the knee, while also providing sensation to the back of the thigh, lower leg, and the sole of your foot. Sciatica is a symptom of another medical condition. For instance, nerve damage or certain conditions, such as a herniated disk or bone spur on the spine, pinch or put pressure on the sciatic nerve. This causes the pain, weakness, or other sensations normally associated with sciatica. Generally, sciatica only affects one side of the body. CAUSES   Herniated or slipped disc.  Degenerative disk disease.  A pain disorder involving the narrow muscle in the buttocks (piriformis syndrome).  Pelvic injury or fracture.  Pregnancy.  Tumor (rare). SYMPTOMS  Symptoms can vary from mild to very severe. The symptoms usually travel from the low back to the buttocks and down the back of the leg. Symptoms can include:  Mild tingling or dull aches in the lower back, leg, or hip.  Numbness in the back of the calf or sole of the foot.  Burning sensations in the lower back, leg, or hip.  Sharp pains in the lower back, leg, or hip.  Leg weakness.  Severe back pain inhibiting movement. These symptoms may get worse with coughing, sneezing, laughing, or prolonged sitting or standing. Also, being overweight may worsen symptoms. DIAGNOSIS  Your caregiver will perform a physical exam to look for common symptoms of sciatica. He or she may ask you to do certain movements or activities that would trigger sciatic nerve pain. Other tests may be performed to find the cause of the sciatica. These may include:  Blood  tests.  X-rays.  Imaging tests, such as an MRI or CT scan. TREATMENT  Treatment is directed at the cause of the sciatic pain. Sometimes, treatment is not necessary and the pain and discomfort goes away on its own. If treatment is needed, your caregiver may suggest:  Over-the-counter medicines to relieve pain.  Prescription medicines, such as anti-inflammatory medicine, muscle relaxants, or narcotics.  Applying heat or ice to the painful area.  Steroid injections to lessen pain, irritation, and inflammation around the nerve.  Reducing activity during periods of pain.  Exercising and stretching to strengthen your abdomen and improve flexibility of your spine. Your caregiver may suggest losing weight if the extra weight makes the back pain worse.  Physical therapy.  Surgery to eliminate what is pressing or pinching the nerve, such as a bone spur or part of a herniated disk. HOME CARE INSTRUCTIONS   Only take over-the-counter or prescription medicines for pain or discomfort as directed by your caregiver.  Apply ice to the affected area for 20 minutes, 3-4 times a day for the first 48-72 hours. Then try heat in the same way.  Exercise, stretch, or perform your usual activities if these do not aggravate your pain.  Attend physical therapy sessions as directed by your caregiver.  Keep all follow-up appointments as directed by your caregiver.  Do not wear high heels or shoes that do not provide proper support.  Check your mattress to see if it is too soft. A firm mattress may lessen your pain and discomfort. SEEK IMMEDIATE MEDICAL CARE IF:   You lose control of your bowel or bladder (incontinence).  You have increasing weakness in the lower back, pelvis, buttocks, or legs.  You have redness or swelling of your back.  You have a burning sensation when you urinate.  You have pain that gets worse when you lie down or awakens you at night.  Your pain is worse than you have  experienced in the past.  Your pain is lasting longer than 4 weeks.  You are suddenly losing weight without reason. MAKE SURE YOU:  Understand these instructions.  Will watch your condition.  Will get help right away if you are not doing well or get worse.   This information is not intended to replace advice given to you by your health care provider. Make sure you discuss any questions you have with your health care provider.   Document Released: 03/16/2001 Document Revised: 12/11/2014 Document Reviewed: 08/01/2011 Elsevier Interactive Patient Education 2016 Elsevier Inc. Back Pain in Pregnancy Back pain during pregnancy is common. It happens in about half of all pregnancies. It is important for you and your baby that you remain active during your pregnancy.If you feel that back pain is not allowing you to remain active or sleep well, it is time to see your caregiver. Back pain may be caused by several factors related to changes during your pregnancy.Fortunately, unless you had trouble with your back before your pregnancy,  the pain is likely to get better after you deliver. Low back pain usually occurs between the fifth and seventh months of pregnancy. It can, however, happen in the first couple months. Factors that increase the risk of back problems include:   Previous back problems.  Injury to your back.  Having twins or multiple births.  A chronic cough.  Stress.  Job-related repetitive motions.  Muscle or spinal disease in the back.  Family history of back problems, ruptured (herniated) discs, or osteoporosis.  Depression, anxiety, and panic attacks. CAUSES   When you are pregnant, your body produces a hormone called relaxin. This hormonemakes the ligaments connecting the low back and pubic bones more flexible. This flexibility allows the baby to be delivered more easily. When your ligaments are loose, your muscles need to work harder to support your back. Soreness in  your back can come from tired muscles. Soreness can also come from back tissues that are irritated since they are receiving less support.  As the baby grows, it puts pressure on the nerves and blood vessels in your pelvis. This can cause back pain.  As the baby grows and gets heavier during pregnancy, the uterus pushes the stomach muscles forward and changes your center of gravity. This makes your back muscles work harder to maintain good posture. SYMPTOMS  Lumbar pain during pregnancy Lumbar pain during pregnancy usually occurs at or above the waist in the center of the back. There may be pain and numbness that radiates into your leg or foot. This is similar to low back pain experienced by non-pregnant women. It usually increases with sitting for long periods of time, standing, or repetitive lifting. Tenderness may also be present in the muscles along your upper back. Posterior pelvic pain during pregnancy Pain in the back of the pelvis is more common than lumbar pain in pregnancy. It is a deep pain felt in your side at the waistline, or across the tailbone (sacrum), or in both places. You may have pain on one or both sides. This pain can also go into the buttocks and backs of the upper thighs. Pubic and groin pain may also be present. The pain does not quickly resolve with rest, and morning stiffness may also be present. Pelvic pain during pregnancy can be brought on by most activities. A high level of fitness before and during pregnancy may or may not prevent this problem. Labor pain is usually 1 to 2 minutes apart, lasts for about 1 minute, and involves a bearing down feeling or pressure in your pelvis. However, if you are at term with the pregnancy, constant low back pain can be the beginning of early labor, and you should be aware of this. DIAGNOSIS  X-rays of the back should not be done during the first 12 to 14 weeks of the pregnancy and only when absolutely necessary during the rest of the  pregnancy. MRIs do not give off radiation and are safe during pregnancy. MRIs also should only be done when absolutely necessary. HOME CARE INSTRUCTIONS  Exercise as directed by your caregiver. Exercise is the most effective way to prevent or manage back pain. If you have a back problem, it is especially important to avoid sports that require sudden body movements. Swimming and walking are great activities.  Do not stand in one place for long periods of time.  Do not wear high heels.  Sit in chairs with good posture. Use a pillow on your lower back if necessary. Make sure your  head rests over your shoulders and is not hanging forward.  Try sleeping on your side, preferably the left side, with a pillow or two between your legs. If you are sore after a night's rest, your bedmay betoo soft.Try placing a board between your mattress and box spring.  Listen to your body when lifting.If you are experiencing pain, ask for help or try bending yourknees more so you can use your leg muscles rather than your back muscles. Squat down when picking up something from the floor. Do not bend over.  Eat a healthy diet. Try to gain weight within your caregiver's recommendations.  Use heat or cold packs 3 to 4 times a day for 15 minutes to help with the pain.  Only take over-the-counter or prescription medicines for pain, discomfort, or fever as directed by your caregiver. Sudden (acute) back pain  Use bed rest for only the most extreme, acute episodes of back pain. Prolonged bed rest over 48 hours will aggravate your condition.  Ice is very effective for acute conditions.  Put ice in a plastic bag.  Place a towel between your skin and the bag.  Leave the ice on for 10 to 20 minutes every 2 hours, or as needed.  Using heat packs for 30 minutes prior to activities is also helpful. Continued back pain See your caregiver if you have continued problems. Your caregiver can help or refer you for  appropriate physical therapy. With conditioning, most back problems can be avoided. Sometimes, a more serious issue may be the cause of back pain. You should be seen right away if new problems seem to be developing. Your caregiver may recommend:  A maternity girdle.  An elastic sling.  A back brace.  A massage therapist or acupuncture. SEEK MEDICAL CARE IF:   You are not able to do most of your daily activities, even when taking the pain medicine you were given.  You need a referral to a physical therapist or chiropractor.  You want to try acupuncture. SEEK IMMEDIATE MEDICAL CARE IF:  You develop numbness, tingling, weakness, or problems with the use of your arms or legs.  You develop severe back pain that is no longer relieved with medicines.  You have a sudden change in bowel or bladder control.  You have increasing pain in other areas of the body.  You develop shortness of breath, dizziness, or fainting.  You develop nausea, vomiting, or sweating.  You have back pain which is similar to labor pains.  You have back pain along with your water breaking or vaginal bleeding.  You have back pain or numbness that travels down your leg.  Your back pain developed after you fell.  You develop pain on one side of your back. You may have a kidney stone.  You see blood in your urine. You may have a bladder infection or kidney stone.  You have back pain with blisters. You may have shingles. Back pain is fairly common during pregnancy but should not be accepted as just part of the process. Back pain should always be treated as soon as possible. This will make your pregnancy as pleasant as possible.   This information is not intended to replace advice given to you by your health care provider. Make sure you discuss any questions you have with your health care provider.   Document Released: 06/30/2005 Document Revised: 06/14/2011 Document Reviewed: 08/11/2010 Elsevier Interactive  Patient Education [employer].

## 2016-01-20 NOTE — MAU Note (Signed)
Patient presents with c/o constant tightness and pressure. Patient states that this pain sharp pain and pressure started 2 days ago but has gotten worse today. Patient also states that she is having back pain.

## 2016-01-21 LAB — GC/CHLAMYDIA PROBE AMP (~~LOC~~) NOT AT ARMC
Chlamydia: NEGATIVE
NEISSERIA GONORRHEA: NEGATIVE

## 2016-02-04 ENCOUNTER — Ambulatory Visit (INDEPENDENT_AMBULATORY_CARE_PROVIDER_SITE_OTHER): Payer: Medicaid Other | Admitting: Obstetrics and Gynecology

## 2016-02-04 VITALS — BP 121/68 | HR 81 | Wt 166.0 lb

## 2016-02-04 DIAGNOSIS — K8689 Other specified diseases of pancreas: Secondary | ICD-10-CM

## 2016-02-04 DIAGNOSIS — J069 Acute upper respiratory infection, unspecified: Secondary | ICD-10-CM

## 2016-02-04 DIAGNOSIS — B9789 Other viral agents as the cause of diseases classified elsewhere: Secondary | ICD-10-CM

## 2016-02-04 DIAGNOSIS — Z3492 Encounter for supervision of normal pregnancy, unspecified, second trimester: Secondary | ICD-10-CM

## 2016-02-04 NOTE — Progress Notes (Signed)
   PRENATAL VISIT NOTE  Subjective:  Diana Torres is a 25 y.o. G3P1011 at [redacted]w[redacted]d being seen today for ongoing prenatal care.  She is currently monitored for the following issues for this low-risk pregnancy and has Hearing impaired; Pancreatic mass; and Supervision of low-risk pregnancy on her problem list.  Patient reports Head congestion.  Contractions: Not present. Vag. Bleeding: None.  Movement: Present. Denies leaking of fluid.   The following portions of the patient's history were reviewed and updated as appropriate: allergies, current medications, past family history, past medical history, past social history, past surgical history and problem list. Problem list updated.  Objective:   Vitals:   02/04/16 0844  BP: 121/68  Pulse: 81  Weight: 166 lb (75.3 kg)    Fetal Status: Fetal Heart Rate (bpm): 142   Movement: Present     General:  Alert, oriented and cooperative. Patient is in no acute distress.  Skin: Skin is warm and dry. No rash noted.   Cardiovascular: Normal heart rate noted  Respiratory: Normal respiratory effort, no problems with respiration noted. Lungs clear   Abdomen: Soft, gravid, appropriate for gestational age. Pain/Pressure: Present     Pelvic:  Cervical exam deferred        Extremities: Normal range of motion.  Edema: None  Mental Status: Normal mood and affect. Normal behavior. Normal judgment and thought content.   Assessment and Plan:  Pregnancy: G3P1011 at [redacted]w[redacted]d  1. Encounter for supervision of low-risk pregnancy in second trimester  - Reviewed previous labs  - TDAP next visit.  - repeat anatomy scan scheduled for 11/8  2. Upper respiratory virus - Cool mist humidifier - Ok to use over the counter zyrtec and sudafed as needed, as directed on the bottle  - Ibuprofen ok in the second trimester with limited use, as directed on the bottle  - If symptoms persist past 2 weeks patient instructed to call or go to Urgent care.   Preterm labor symptoms  and general obstetric precautions including but not limited to vaginal bleeding, contractions, leaking of fluid and fetal movement were reviewed in detail with the patient. Please refer to After Visit Summary for other counseling recommendations.  Return in about 2 weeks (around 02/18/2016).  Lezlie Lye, NP

## 2016-02-04 NOTE — Progress Notes (Signed)
ASL interpreter # Adonis Brook

## 2016-02-11 ENCOUNTER — Ambulatory Visit (HOSPITAL_COMMUNITY)
Admission: RE | Admit: 2016-02-11 | Discharge: 2016-02-11 | Disposition: A | Discharge status: Home / Self Care | Payer: Medicaid Other | Source: Ambulatory Visit | Attending: Family | Admitting: Family

## 2016-02-11 DIAGNOSIS — Z3A25 25 weeks gestation of pregnancy: Secondary | ICD-10-CM | POA: Insufficient documentation

## 2016-02-11 DIAGNOSIS — Z3492 Encounter for supervision of normal pregnancy, unspecified, second trimester: Secondary | ICD-10-CM

## 2016-02-11 DIAGNOSIS — Z362 Encounter for other antenatal screening follow-up: Secondary | ICD-10-CM | POA: Insufficient documentation

## 2016-03-03 ENCOUNTER — Ambulatory Visit (INDEPENDENT_AMBULATORY_CARE_PROVIDER_SITE_OTHER): Payer: Medicaid Other | Admitting: Advanced Practice Midwife

## 2016-03-03 ENCOUNTER — Encounter: Payer: Medicaid Other | Admitting: Obstetrics and Gynecology

## 2016-03-03 VITALS — BP 124/88 | HR 109 | Wt 174.0 lb

## 2016-03-03 DIAGNOSIS — Z3493 Encounter for supervision of normal pregnancy, unspecified, third trimester: Secondary | ICD-10-CM

## 2016-03-03 DIAGNOSIS — O133 Gestational [pregnancy-induced] hypertension without significant proteinuria, third trimester: Secondary | ICD-10-CM

## 2016-03-03 DIAGNOSIS — O4700 False labor before 37 completed weeks of gestation, unspecified trimester: Secondary | ICD-10-CM

## 2016-03-03 DIAGNOSIS — Z23 Encounter for immunization: Secondary | ICD-10-CM

## 2016-03-03 DIAGNOSIS — O4703 False labor before 37 completed weeks of gestation, third trimester: Secondary | ICD-10-CM

## 2016-03-03 DIAGNOSIS — R0989 Other specified symptoms and signs involving the circulatory and respiratory systems: Secondary | ICD-10-CM

## 2016-03-03 DIAGNOSIS — O479 False labor, unspecified: Secondary | ICD-10-CM

## 2016-03-03 LAB — POCT URINALYSIS DIP (DEVICE)
BILIRUBIN URINE: NEGATIVE
GLUCOSE, UA: NEGATIVE mg/dL
HGB URINE DIPSTICK: NEGATIVE
Ketones, ur: NEGATIVE mg/dL
NITRITE: NEGATIVE
Protein, ur: NEGATIVE mg/dL
Specific Gravity, Urine: 1.02 (ref 1.005–1.030)
Urobilinogen, UA: 0.2 mg/dL (ref 0.0–1.0)
pH: 7 (ref 5.0–8.0)

## 2016-03-03 LAB — FETAL FIBRONECTIN: Fetal Fibronectin: NEGATIVE

## 2016-03-04 LAB — PROTEIN / CREATININE RATIO, URINE
Creatinine, Urine: 155 mg/dL (ref 20–320)
Protein Creatinine Ratio: 58 mg/g creat (ref 21–161)
TOTAL PROTEIN, URINE: 9 mg/dL (ref 5–24)

## 2016-03-08 ENCOUNTER — Encounter: Payer: Self-pay | Admitting: Advanced Practice Midwife

## 2016-03-08 DIAGNOSIS — R0989 Other specified symptoms and signs involving the circulatory and respiratory systems: Secondary | ICD-10-CM

## 2016-03-08 HISTORY — DX: Other specified symptoms and signs involving the circulatory and respiratory systems: R09.89

## 2016-03-08 NOTE — Progress Notes (Signed)
   PRENATAL VISIT NOTE  Subjective:  Toccara Alford is a 25 y.o. G3P1011 at 23w0dbeing seen today for ongoing prenatal care.  She is currently monitored for the following issues for this low-risk pregnancy and has Hearing impaired; Pancreatic mass; Supervision of low-risk pregnancy; and Upper respiratory virus on her problem list.  Patient reports no complaints.  Contractions: Irritability. Vag. Bleeding: None.  Movement: Present. Denies leaking of fluid.   The following portions of the patient's history were reviewed and updated as appropriate: allergies, current medications, past family history, past medical history, past social history, past surgical history and problem list. Problem list updated.  Objective:   Vitals:   03/03/16 1024 03/03/16 1048  BP: (!) 118/91 124/88  Pulse: (!) 109   Weight: 174 lb (78.9 kg)     Fetal Status: Fetal Heart Rate (bpm): 144   Movement: Present     General:  Alert, oriented and cooperative. Patient is in no acute distress.  Skin: Skin is warm and dry. No rash noted.   Cardiovascular: Normal heart rate noted  Respiratory: Normal respiratory effort, no problems with respiration noted  Abdomen: Soft, gravid, appropriate for gestational age. Pain/Pressure: Present     Pelvic:  Closed thick  Extremities: Normal range of motion.  Edema: None  Mental Status: Normal mood and affect. Normal behavior. Normal judgment and thought content.   Assessment and Plan:  Pregnancy: G3P1011 at 257w0d1. Encounter for supervision of low-risk pregnancy in third trimester     First BP elevated, repeat normal but borderline      No headache or other symptoms, will monitor - Tdap vaccine greater than or equal to 7yo IM  2. Preterm contractions        Cx closed/thick - Fetal fibronectin - Fetal nonstress test  3. Pregnancy-induced hypertension in third trimester       Will check labs due to one elevated Bp today - Comp Met (CMET) - CBC - Protein / Creatinine  Ratio, Urine  Preterm labor symptoms and general obstetric precautions including but not limited to vaginal bleeding, contractions, leaking of fluid and fetal movement were reviewed in detail with the patient. Please refer to After Visit Summary for other counseling recommendations.  RTC 1-2 weeks   MaSeabron SpatesCNM

## 2016-03-08 NOTE — Patient Instructions (Signed)

## 2016-03-10 ENCOUNTER — Other Ambulatory Visit: Payer: Medicaid Other

## 2016-03-24 ENCOUNTER — Ambulatory Visit (INDEPENDENT_AMBULATORY_CARE_PROVIDER_SITE_OTHER): Payer: Medicaid Other | Admitting: Obstetrics and Gynecology

## 2016-03-24 ENCOUNTER — Encounter: Payer: Self-pay | Admitting: Obstetrics and Gynecology

## 2016-03-24 VITALS — BP 115/48 | HR 92 | Wt 178.5 lb

## 2016-03-24 DIAGNOSIS — Z3493 Encounter for supervision of normal pregnancy, unspecified, third trimester: Secondary | ICD-10-CM

## 2016-03-24 DIAGNOSIS — H9190 Unspecified hearing loss, unspecified ear: Secondary | ICD-10-CM

## 2016-03-24 NOTE — Progress Notes (Signed)
Subjective:  Diana Torres is a 25 y.o. G3P1011 at [redacted]w[redacted]d being seen today for ongoing prenatal care.  She is currently monitored for the following issues for this high-risk pregnancy and has Hearing impaired; Supervision of low-risk pregnancy; and Labile hypertension on her problem list.  Patient reports no complaints.  Contractions: Irritability. Vag. Bleeding: None.  Movement: Present. Denies leaking of fluid.   The following portions of the patient's history were reviewed and updated as appropriate: allergies, current medications, past family history, past medical history, past social history, past surgical history and problem list. Problem list updated.  Objective:   Vitals:   03/24/16 1044  BP: (!) 115/48  Pulse: 92  Weight: 178 lb 8 oz (81 kg)    Fetal Status: Fetal Heart Rate (bpm): 145 Fundal Height: 32 cm Movement: Present     General:  Alert, oriented and cooperative. Patient is in no acute distress.  Skin: Skin is warm and dry. No rash noted.   Cardiovascular: Normal heart rate noted  Respiratory: Normal respiratory effort, no problems with respiration noted  Abdomen: Soft, gravid, appropriate for gestational age. Pain/Pressure: Present     Pelvic:  Cervical exam deferred        Extremities: Normal range of motion.  Edema: None  Mental Status: Normal mood and affect. Normal behavior. Normal judgment and thought content.   Urinalysis:      Assessment and Plan:  Pregnancy: G3P1011 at [redacted]w[redacted]d  1. Hearing loss, unspecified hearing loss type, unspecified laterality   2. Encounter for supervision of low-risk pregnancy in third trimester No problems Glucola this week or next week BP good today Reports only an occ ut ctx  Preterm labor symptoms and general obstetric precautions including but not limited to vaginal bleeding, contractions, leaking of fluid and fetal movement were reviewed in detail with the patient. Please refer to After Visit Summary for other counseling  recommendations.  Return in about 1 week (around 03/31/2016) for OB visit.   Chancy Milroy, MD

## 2016-03-26 ENCOUNTER — Other Ambulatory Visit: Payer: Medicaid Other

## 2016-03-30 ENCOUNTER — Other Ambulatory Visit: Payer: Medicaid Other

## 2016-04-05 NOTE — L&D Delivery Note (Signed)
Delivery Note At 1010 on 05/26/2016 a viable female was delivered via  (Presentation: vertex, LOA  ).  APGAR: 9, 9; weight pending.   Placenta status: spontaneous, intact.  Cord: 3 vessel loose nuchal x1 loop easily reduced with the following complications: none.  Anesthesia: Epidural Episiotomy: None Lacerations: None Suture Repair: N/A Est. Blood Loss (mL): 100  Mom to postpartum.  Baby to Couplet care / Skin to Skin.  [place] Bing, DO, PGY-1 05/26/2016, 10:29 AM

## 2016-04-07 ENCOUNTER — Encounter: Payer: Self-pay | Admitting: Advanced Practice Midwife

## 2016-04-07 ENCOUNTER — Ambulatory Visit (INDEPENDENT_AMBULATORY_CARE_PROVIDER_SITE_OTHER): Payer: Medicaid Other | Admitting: Advanced Practice Midwife

## 2016-04-07 VITALS — BP 117/75 | HR 100 | Wt 180.9 lb

## 2016-04-07 DIAGNOSIS — O4700 False labor before 37 completed weeks of gestation, unspecified trimester: Secondary | ICD-10-CM

## 2016-04-07 DIAGNOSIS — R0989 Other specified symptoms and signs involving the circulatory and respiratory systems: Secondary | ICD-10-CM

## 2016-04-07 DIAGNOSIS — I10 Essential (primary) hypertension: Secondary | ICD-10-CM

## 2016-04-07 DIAGNOSIS — N898 Other specified noninflammatory disorders of vagina: Secondary | ICD-10-CM

## 2016-04-07 DIAGNOSIS — O26899 Other specified pregnancy related conditions, unspecified trimester: Secondary | ICD-10-CM

## 2016-04-07 DIAGNOSIS — O26893 Other specified pregnancy related conditions, third trimester: Secondary | ICD-10-CM

## 2016-04-07 DIAGNOSIS — O4703 False labor before 37 completed weeks of gestation, third trimester: Secondary | ICD-10-CM

## 2016-04-07 DIAGNOSIS — Z3493 Encounter for supervision of normal pregnancy, unspecified, third trimester: Secondary | ICD-10-CM

## 2016-04-07 LAB — POCT URINALYSIS DIP (DEVICE)
Bilirubin Urine: NEGATIVE
Glucose, UA: 100 mg/dL — AB
HGB URINE DIPSTICK: NEGATIVE
KETONES UR: NEGATIVE mg/dL
LEUKOCYTES UA: NEGATIVE
Nitrite: NEGATIVE
Protein, ur: NEGATIVE mg/dL
SPECIFIC GRAVITY, URINE: 1.025 (ref 1.005–1.030)
UROBILINOGEN UA: 0.2 mg/dL (ref 0.0–1.0)
pH: 6 (ref 5.0–8.0)

## 2016-04-07 LAB — FETAL FIBRONECTIN: Fetal Fibronectin: NEGATIVE

## 2016-04-07 NOTE — Progress Notes (Signed)
Used interpreter Jacki Cones. C/o contractions one every 1-2 hours, c/o a lot of pressure since last week.

## 2016-04-07 NOTE — Progress Notes (Signed)
   PRENATAL VISIT NOTE  Subjective:  Diana Torres is a 26 y.o. G3P1011 at [redacted]w[redacted]d being seen today for ongoing prenatal care.  She is currently monitored for the following issues for this low-risk pregnancy and has Hearing impaired; Supervision of low-risk pregnancy; and Labile hypertension on her problem list.  Patient reports occasional contractions and leaking small amount of clear fluid today. .  Contractions: Irregular. Vag. Bleeding: None.  Movement: Present. Denies leaking of fluid.   The following portions of the patient's history were reviewed and updated as appropriate: allergies, current medications, past family history, past medical history, past social history, past surgical history and problem list. Problem list updated.  Objective:   Vitals:   04/07/16 1114  BP: 117/75  Pulse: 100  Weight: 180 lb 14.4 oz (82.1 kg)    Fetal Status: Fetal Heart Rate (bpm): 152 Fundal Height: 34 cm Movement: Present  Presentation: Vertex  General:  Alert, oriented and cooperative. Patient is in no acute distress.  Skin: Skin is warm and dry. No rash noted.   Cardiovascular: Normal heart rate noted  Respiratory: Normal respiratory effort, no problems with respiration noted  Abdomen: Soft, gravid, appropriate for gestational age. Pain/Pressure: Present     Pelvic:  Cervical exam performed Dilation: Closed Effacement (%): 0 Station: Ballotable. Neg pooling w/ valsalva.   Extremities: Normal range of motion.  Edema: Trace  Mental Status: Normal mood and affect. Normal behavior. Normal judgment and thought content.   Assessment and Plan:  Pregnancy: G3P1011 at [redacted]w[redacted]d  1. Labile hypertension   2. Encounter for supervision of low-risk pregnancy in third trimester   3. Vaginal discharge during pregnancy, antepartum  - Wet prep, genital - POCT Ferning--Neg  4. Preterm uterine contractions, antepartum  - Fetal fibronectin--discarded - Wet prep, genital - POCT Ferning - Culture, OB  Urine  Preterm labor symptoms and general obstetric precautions including but not limited to vaginal bleeding, contractions, leaking of fluid and fetal movement were reviewed in detail with the patient. Please refer to After Visit Summary for other counseling recommendations.  F/U 2 weeks  [name], [place]

## 2016-04-08 LAB — WET PREP, GENITAL
Trich, Wet Prep: NONE SEEN
Yeast Wet Prep HPF POC: NONE SEEN

## 2016-04-09 ENCOUNTER — Other Ambulatory Visit: Payer: Medicaid Other

## 2016-04-09 LAB — CULTURE, OB URINE

## 2016-04-10 ENCOUNTER — Other Ambulatory Visit: Payer: Self-pay | Admitting: Advanced Practice Midwife

## 2016-04-10 MED ORDER — METRONIDAZOLE 500 MG PO TABS: 500.0000 mg | ORAL_TABLET | Freq: Two times a day (BID) | ORAL | 0 refills | Status: DC

## 2016-04-10 MED ORDER — CEFDINIR 300 MG PO CAPS: 300.0000 mg | ORAL_CAPSULE | Freq: Two times a day (BID) | ORAL | 0 refills | Status: DC

## 2016-04-12 ENCOUNTER — Other Ambulatory Visit: Payer: Self-pay | Admitting: *Deleted

## 2016-04-12 NOTE — Telephone Encounter (Signed)
Per message from [place], CNM need to notify patient of negative FFN, + UTI, + Bv and RX for omnicef and flagyl sent to her pharmacy. Patient is hearing impaired.   I called patient via her preferred number which is automatically routed to [employer]. I left a message we are trying to reach her re: results and prescriptions. Please call our office.

## 2016-04-12 NOTE — Telephone Encounter (Signed)
Patient called back into front office through interpreter. Informed patient of results & medication at pharmacy. Patient verbalized understanding & had no questions

## 2016-04-14 ENCOUNTER — Other Ambulatory Visit: Payer: Medicaid Other

## 2016-04-20 ENCOUNTER — Encounter (HOSPITAL_COMMUNITY): Payer: Self-pay

## 2016-04-20 ENCOUNTER — Inpatient Hospital Stay (HOSPITAL_COMMUNITY)
Admission: AD | Admit: 2016-04-20 | Discharge: 2016-04-20 | Disposition: A | Discharge status: Home / Self Care | Payer: Medicaid Other | Source: Ambulatory Visit | Attending: Obstetrics and Gynecology | Admitting: Obstetrics and Gynecology

## 2016-04-20 DIAGNOSIS — Z3A35 35 weeks gestation of pregnancy: Secondary | ICD-10-CM | POA: Insufficient documentation

## 2016-04-20 DIAGNOSIS — O9989 Other specified diseases and conditions complicating pregnancy, childbirth and the puerperium: Secondary | ICD-10-CM | POA: Diagnosis not present

## 2016-04-20 DIAGNOSIS — R208 Other disturbances of skin sensation: Secondary | ICD-10-CM

## 2016-04-20 DIAGNOSIS — O26893 Other specified pregnancy related conditions, third trimester: Secondary | ICD-10-CM | POA: Insufficient documentation

## 2016-04-20 DIAGNOSIS — R1010 Upper abdominal pain, unspecified: Secondary | ICD-10-CM | POA: Diagnosis present

## 2016-04-20 DIAGNOSIS — L7682 Other postprocedural complications of skin and subcutaneous tissue: Secondary | ICD-10-CM

## 2016-04-20 LAB — CBC
HCT: 34.6 % — ABNORMAL LOW (ref 36.0–46.0)
HEMOGLOBIN: 11.6 g/dL — AB (ref 12.0–15.0)
MCH: 28.9 pg (ref 26.0–34.0)
MCHC: 33.5 g/dL (ref 30.0–36.0)
MCV: 86.1 fL (ref 78.0–100.0)
Platelets: 209 10*3/uL (ref 150–400)
RBC: 4.02 MIL/uL (ref 3.87–5.11)
RDW: 14.2 % (ref 11.5–15.5)
WBC: 12.5 10*3/uL — AB (ref 4.0–10.5)

## 2016-04-20 LAB — COMPREHENSIVE METABOLIC PANEL
ALBUMIN: 3.2 g/dL — AB (ref 3.5–5.0)
ALK PHOS: 170 U/L — AB (ref 38–126)
ALT: 15 U/L (ref 14–54)
AST: 16 U/L (ref 15–41)
Anion gap: 8 (ref 5–15)
BUN: 10 mg/dL (ref 6–20)
CALCIUM: 9.3 mg/dL (ref 8.9–10.3)
CO2: 21 mmol/L — AB (ref 22–32)
CREATININE: 0.5 mg/dL (ref 0.44–1.00)
Chloride: 103 mmol/L (ref 101–111)
GFR calc Af Amer: >60 mL/min (ref >60)
GFR calc non Af Amer: >60 mL/min (ref >60)
GLUCOSE: 85 mg/dL (ref 65–99)
Potassium: 3.8 mmol/L (ref 3.5–5.1)
SODIUM: 132 mmol/L — AB (ref 135–145)
Total Bilirubin: 0.4 mg/dL (ref 0.3–1.2)
Total Protein: 7 g/dL (ref 6.5–8.1)

## 2016-04-20 LAB — AMYLASE: Amylase: 79 U/L (ref 28–100)

## 2016-04-20 LAB — LIPASE, BLOOD: Lipase: 29 U/L (ref 11–51)

## 2016-04-20 MED ORDER — TRAMADOL HCL 50 MG PO TABS
50.0000 mg | ORAL_TABLET | Freq: Once | ORAL | Status: AC
  Administered 2016-04-20: 50 mg via ORAL
  Filled 2016-04-20: qty 1

## 2016-04-20 NOTE — MAU Note (Signed)
Urine sent to lab 

## 2016-04-20 NOTE — Progress Notes (Addendum)
G3P1 @ 35+[redacted] wksga. Presents to triage for upper area abdominal pain. Denies LOF or bleeding but has mucus discharge. +FM. EFM applied.   Hx: Hearing impaired (sensorineural). Interpreter video utilized.   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 Assessing pt and POC discussed.   2025: provider notified regarding pt's increasing pain level.   2130: Discharge orders given with interpreter with pt understanding.   2140: pt left unit via ambulatory with So.

## 2016-04-20 NOTE — Discharge Instructions (Signed)

## 2016-04-20 NOTE — MAU Note (Signed)
Pt presents complaining of pain at her incision site from her pancreatic surgery. States the pain got worse when she turned 35 weeks. Tylenol does not help it. Denies bleeding or leaking. Reports good fetal movement and thinks the pain is related to the increased fetal movement.

## 2016-04-20 NOTE — MAU Provider Note (Signed)
Chief Complaint:  Abdominal Pain   First Provider Initiated Contact with Patient 04/20/16 [id number]     HPI: Diana Torres is a 26 y.o. G3P1011 at 51w1dwho presents to maternity admissions reporting upper abdominal pain when the baby kicks. States it is causing pain in her incisions and pancreas.  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  Pain started yesterday. . She reports good fetal movement, denies LOF, vaginal bleeding, vaginal itching/burning, urinary symptoms, h/a, dizziness, n/v, diarrhea, constipation or fever/chills.  She denies headache, visual changes or RUQ abdominal pain.  Abdominal Pain  This is a new problem. The current episode started yesterday. The onset quality is gradual. The problem occurs constantly. The problem has been unchanged. The pain is located in the epigastric region. The pain is moderate. The quality of the pain is sharp. The abdominal pain does not radiate. Pertinent negatives include no constipation, diarrhea, dysuria, fever, frequency, headaches, myalgias, nausea or vomiting. The pain is aggravated by palpation. The pain is relieved by nothing. She has tried acetaminophen for the symptoms. The treatment provided no relief.   RN Note: Pt presents complaining of pain at her incision site from her pancreatic surgery. States the pain got worse when she turned 35 weeks. Tylenol does not help it. Denies bleeding or leaking. Reports good fetal movement and thinks the pain is related to the increased fetal movement.     Electronically signed by Wende Mott, RN     Past Medical History: Past Medical History:  Diagnosis Date  . Deaf   . History of broken nose   . 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 Mass    cyst/left upper abd quad  . Miscarriage    pt states had difficulty with high BP prior to miscarriage and afterwards till infection was healed  . PONV (postoperative nausea and vomiting)   . UTI (lower urinary tract infection)      Past obstetric history: OB History  Gravida Para Term Preterm AB Living  3 1 1   1 1   SAB TAB Ectopic Multiple Live Births  1       1    # Outcome Date GA Lbr Len/2nd Weight Sex Delivery Anes PTL Lv  3 Current           2 SAB 02/18/15 [redacted]w[redacted]d         1 Term 09/23/12 [redacted]w[redacted]d 15:04 / 02:15 7 lb 6 oz (3.345 kg) M Vag-Spont EPI  LIV      Past Surgical History: Past Surgical History:  Procedure Laterality Date  . COCHLEAR IMPLANT    . 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 RHINOPLASTY    . right foot  2016   cyst removed  . TONSILLECTOMY      Family History: Family History  Problem Relation Age of Onset  . Hearing loss Mother   . Hypertension Mother   . Alcohol abuse Father   . Hearing loss Father     Social History: Social History  Substance Use Topics  . Smoking status: Never Smoker  . Smokeless tobacco: Never Used  . Alcohol use No    Allergies:  Allergies  Allergen Reactions  . Codeine Itching and Rash    Meds:  Prescriptions Prior to Admission  Medication Sig Dispense Refill Last Dose  . cefdinir (OMNICEF)  300 MG capsule Take 1 capsule (300 mg total) by mouth 2 (two) times daily. 14 capsule 0   . metroNIDAZOLE (FLAGYL) 500 MG tablet Take 1 tablet (500 mg total) by mouth 2 (two) times daily. 14 tablet 0   . prenatal vitamin w/FE, FA (PRENATAL 1 + 1) 27-1 MG TABS tablet Take 1 tablet by mouth daily at 12 noon.   Taking    I have reviewed patient's Past Medical Hx, Surgical Hx, Family Hx, Social Hx, medications and allergies.   ROS:  Review of Systems  Constitutional: Negative for fever.  Gastrointestinal: Positive for abdominal pain. Negative for constipation, diarrhea, nausea and vomiting.  Genitourinary: Negative for dysuria and frequency.  Musculoskeletal: Negative for myalgias.  Neurological:  Negative for headaches.   Other systems negative  Physical Exam  Patient Vitals for the past 24 hrs:  BP Temp Temp src Pulse Resp SpO2  04/20/16 1943 - - - 97 - 98 %  04/20/16 1926 129/75 98.1 F (36.7 C) Oral 95 18 -   Constitutional: Well-developed, well-nourished female in no acute distress.  Cardiovascular: normal rate and rhythm Respiratory: normal effort, clear to auscultation bilaterally GI: Abd soft, non-tender, gravid appropriate for gestational age.   No rebound or guarding. MS: Extremities nontender, no edema, normal ROM Neurologic: Alert and oriented x 4.  GU: Neg CVAT.  PELVIC EXAM: Cervix pink, visually closed, without lesion, scant white creamy discharge, vaginal walls and external genitalia normal Bimanual exam: Cervix firm, posterior, neg CMT, uterus nontender, Fundal Height consistent with dates, adnexa without tenderness, enlargement, or mass   FHT:  Baseline 140 , moderate variability, accelerations present, no decelerations Contractions:   Rare   Labs: No results found for this or any previous visit (from the past 24 hour(s)). O/POS/-- (07/25 1014) Results for orders placed or performed during the hospital encounter of 04/20/16 (from the past 24 hour(s))  CBC     Status: Abnormal   Collection Time: 04/20/16  8:03 PM  Result Value Ref Range   WBC 12.5 (H) 4.0 - 10.5 K/uL   RBC 4.02 3.87 - 5.11 MIL/uL   Hemoglobin 11.6 (L) 12.0 - 15.0 g/dL   HCT 34.6 (L) 36.0 - 46.0 %   MCV 86.1 78.0 - 100.0 fL   MCH 28.9 26.0 - 34.0 pg   MCHC 33.5 30.0 - 36.0 g/dL   RDW 14.2 11.5 - 15.5 %   Platelets 209 150 - 400 K/uL  Comprehensive metabolic panel     Status: Abnormal   Collection Time: 04/20/16  8:03 PM  Result Value Ref Range   Sodium 132 (L) 135 - 145 mmol/L   Potassium 3.8 3.5 - 5.1 mmol/L   Chloride 103 101 - 111 mmol/L   CO2 21 (L) 22 - 32 mmol/L   Glucose, Bld 85 65 - 99 mg/dL   BUN 10 6 - 20 mg/dL   Creatinine, Ser 0.50 0.44 - 1.00 mg/dL   Calcium 9.3  8.9 - 10.3 mg/dL   Total Protein 7.0 6.5 - 8.1 g/dL   Albumin 3.2 (L) 3.5 - 5.0 g/dL   AST 16 15 - 41 U/L   ALT 15 14 - 54 U/L   Alkaline Phosphatase 170 (H) 38 - 126 U/L   Total Bilirubin 0.4 0.3 - 1.2 mg/dL   GFR calc non Af Amer >60 >60 mL/min   GFR calc Af Amer >60 >60 mL/min   Anion gap 8 5 - 15  Amylase     Status: None  Collection Time: 04/20/16  8:03 PM  Result Value Ref Range   Amylase 79 28 - 100 U/L  Lipase, blood     Status: None   Collection Time: 04/20/16  8:03 PM  Result Value Ref Range   Lipase 29 11 - 51 U/L    Imaging:  No results found.  MAU Course/MDM: I have ordered labs and reviewed results. They are normal NST reviewed and found to be reactive Consult Dr Elly Modena with presentation, exam findings and test results.  Treatments in MAU included Tramadol for pain.  .  Reviewed normal results, and patient is reassured.   Assessment: Pain at old incision site, likely stretching Normal labs No evidence of acute  Pancreatic problem  Plan: Discharge home Tylenol for pain Call surgeon if pain persists for suggestions Reassured this is probably just stretching of scar tissue Will reculture urine for test of cure  Labor precautions and fetal kick counts Follow up in Office for prenatal visits and recheck of results   Pt stable at time of discharge.  Hansel Feinstein CNM, MSN Certified Nurse-Midwife 04/20/2016 7:45 PM

## 2016-04-22 LAB — OB RESULTS CONSOLE GBS: GBS: NEGATIVE

## 2016-04-26 ENCOUNTER — Ambulatory Visit (INDEPENDENT_AMBULATORY_CARE_PROVIDER_SITE_OTHER): Payer: Medicaid Other | Admitting: Advanced Practice Midwife

## 2016-04-26 ENCOUNTER — Other Ambulatory Visit (HOSPITAL_COMMUNITY)
Admission: RE | Admit: 2016-04-26 | Discharge: 2016-04-26 | Disposition: A | Discharge status: Home / Self Care | Payer: Medicaid Other | Source: Ambulatory Visit | Attending: Advanced Practice Midwife | Admitting: Advanced Practice Midwife

## 2016-04-26 VITALS — BP 117/75 | HR 89 | Wt 184.9 lb

## 2016-04-26 DIAGNOSIS — Z113 Encounter for screening for infections with a predominantly sexual mode of transmission: Secondary | ICD-10-CM | POA: Insufficient documentation

## 2016-04-26 DIAGNOSIS — Z3493 Encounter for supervision of normal pregnancy, unspecified, third trimester: Secondary | ICD-10-CM

## 2016-04-26 DIAGNOSIS — Z3483 Encounter for supervision of other normal pregnancy, third trimester: Secondary | ICD-10-CM

## 2016-04-26 LAB — OB RESULTS CONSOLE GC/CHLAMYDIA: GC PROBE AMP, GENITAL: NEGATIVE

## 2016-04-26 NOTE — Patient Instructions (Signed)
Contracciones de Physiological scientist (Braxton Hicks Contractions) Durante el [place], pueden presentarse contracciones uterinas que no siempre indican que est en [place]. QU SON LAS CONTRACCIONES DE BRAXTON HICKS? Las [employer] se presentan antes del [place] de [place] se conocen como contracciones de [place] o falso trabajo de [place]. Hacia el final del embarazo (32 a 34semanas), estas contracciones pueden aparecen con ms frecuencia y volverse ms intensas. No corresponden al [place] de parto verdadero porque estas contracciones no producen el agrandamiento (la dilatacin) y el afinamiento del cuello del tero. Algunas veces, es difcil distinguirlas del trabajo de parto verdadero porque en algunos casos pueden ser [employer], y las personas tienen diferentes niveles de tolerancia al [employer]. No debe sentirse avergonzada si concurre al hospital con falso trabajo de [place]. En ocasiones, la nica forma de saber si el trabajo de parto es verdadero es que el mdico determine si hay cambios en el cuello del tero. Si no hay problemas prenatales u otras complicaciones de salud asociadas con el embarazo, no habr inconvenientes si la envan a su casa con falso trabajo de parto y espera que comience el verdadero. Victor DEL VERDADERO Falso trabajo de parto   Las contracciones del falso trabajo de parto duran menos y no son tan intensas como las verdaderas.  Generalmente son irregulares.  A menudo, se sienten en la parte delantera de la parte baja del abdomen y en la ingle,  y pueden desaparecer cuando camina o cambia de posicin mientras est acostada.  Las contracciones se vuelven ms dbiles y su duracin es Garment/textile technologist a medida que el tiempo transcurre.  Por lo general, no se hacen progresivamente ms intensas, regulares y Magazine features editor entre s como en el caso del [place] de parto verdadero. Antionette Fairy de parto   Las contracciones del verdadero  trabajo de parto duran de 72 a 70segundos, son muy regulares y suelen volverse ms intensas, y [place] su frecuencia.  No desaparecen cuando camina.  La molestia generalmente se siente en la parte superior del tero y se extiende hacia la zona inferior del abdomen y [employer] cintura.  El mdico podr examinarla para determinar si el trabajo de parto es verdadero. El examen mostrar si el cuello del tero se est dilatando y [place]. LO QUE DEBE RECORDAR  [place] haciendo los ejercicios habituales y siga otras indicaciones que el mdico le d.  Tome todos los medicamentos como le indic el mdico.  Consulting civil engineer a las visitas prenatales regulares.  Coma y beba con moderacin si cree que est en trabajo de parto.  Si las contracciones de [employer] provocan incomodidad:  [place]amb[place] de posicin: si est acostada o descansando, camine; si est caminando, descanse.  Sintese y descanse en una baera con agua tibia.  Beba 2 o 3vasos de [place]. La deshidratacin puede provocar contracciones.  Respire lenta y profundamente varias veces por hora. [place]? Solicite atencin mdica de inmediato si:  Las contracciones se intensifican, se hacen ms regulares y Industrial/product designer s.  Tiene una prdida de lquido por la vagina.  Tiene fiebre.  Elimina mucosidad manchada con [place].  Tiene una hemorragia vaginal abundante.  Tiene dolor abdominal permanente.  Tiene un dolor en la zona lumbar que nunca tuvo antes.  Siente que la cabeza del beb empuja hacia abajo y ejerce presin en la zona plvica.  El beb no se mueve Administrator, Civil Service. Esta informacin no tiene Psychologist, clinical  consejo del mdico. Asegrese de hacerle al mdico cualquier pregunta que tenga. Document Released: 12/30/2004 Document Revised: 07/14/2015 Document Reviewed: 01/01/2013 Elsevier Interactive Patient Education  2017 [employer].

## 2016-04-26 NOTE — Progress Notes (Signed)
36 week cultures today 

## 2016-04-26 NOTE — Progress Notes (Signed)
   PRENATAL VISIT NOTE  Subjective:  Diana Torres is a 26 y.o. G3P1011 at [redacted]w[redacted]d being seen today for ongoing prenatal care.  She is currently monitored for the following issues for this low-risk pregnancy and has Hearing impaired; Supervision of low-risk pregnancy; and Labile hypertension on her problem list.  Patient reports occasional contractions.  Contractions: Irritability. Vag. Bleeding: None.  Movement: Present. Denies leaking of fluid.   The following portions of the patient's history were reviewed and updated as appropriate: allergies, current medications, past family history, past medical history, past social history, past surgical history and problem list. Problem list updated.  Objective:   Vitals:   04/26/16 0856  BP: 117/75  Pulse: 89  Weight: 184 lb 14.4 oz (83.9 kg)    Fetal Status: Fetal Heart Rate (bpm): 132 Fundal Height: 37 cm Movement: Present  Presentation: Vertex  General:  Alert, oriented and cooperative. Patient is in no acute distress.  Skin: Skin is warm and dry. No rash noted.   Cardiovascular: Normal heart rate noted  Respiratory: Normal respiratory effort, no problems with respiration noted  Abdomen: Soft, gravid, appropriate for gestational age. Pain/Pressure: Present     Pelvic:  Cervical exam performed Dilation: 1 Effacement (%): 50 Station: -3  Extremities: Normal range of motion.  Edema: None  Mental Status: Normal mood and affect. Normal behavior. Normal judgment and thought content.   Assessment and Plan:  Pregnancy: G3P1011 at [redacted]w[redacted]d  1. Encounter for supervision of low-risk pregnancy in third trimester  - GC/Chlamydia probe amp ([place])not at [facility] - Culture, beta strep (group b only)  2. Encounter for supervision of other normal pregnancy in third trimester - BTL consent singed today  Term labor symptoms and general obstetric precautions including but not limited to vaginal bleeding, contractions, leaking of fluid and fetal  movement were reviewed in detail with the patient. Please refer to After Visit Summary for other counseling recommendations.  Return in about 1 week (around 05/03/2016) for [place].   [name], CNM

## 2016-04-27 LAB — GC/CHLAMYDIA PROBE AMP (~~LOC~~) NOT AT ARMC
Chlamydia: NEGATIVE
Neisseria Gonorrhea: NEGATIVE

## 2016-05-03 ENCOUNTER — Ambulatory Visit (INDEPENDENT_AMBULATORY_CARE_PROVIDER_SITE_OTHER): Payer: Medicaid Other | Admitting: Clinical

## 2016-05-03 ENCOUNTER — Ambulatory Visit (INDEPENDENT_AMBULATORY_CARE_PROVIDER_SITE_OTHER): Payer: Medicaid Other | Admitting: Family Medicine

## 2016-05-03 VITALS — BP 120/76 | HR 103 | Wt 188.0 lb

## 2016-05-03 DIAGNOSIS — M25551 Pain in right hip: Secondary | ICD-10-CM | POA: Diagnosis not present

## 2016-05-03 DIAGNOSIS — M9905 Segmental and somatic dysfunction of pelvic region: Secondary | ICD-10-CM | POA: Diagnosis not present

## 2016-05-03 DIAGNOSIS — M9904 Segmental and somatic dysfunction of sacral region: Secondary | ICD-10-CM | POA: Diagnosis not present

## 2016-05-03 DIAGNOSIS — F4323 Adjustment disorder with mixed anxiety and depressed mood: Secondary | ICD-10-CM | POA: Diagnosis not present

## 2016-05-03 DIAGNOSIS — Z3493 Encounter for supervision of normal pregnancy, unspecified, third trimester: Secondary | ICD-10-CM | POA: Diagnosis not present

## 2016-05-03 DIAGNOSIS — M9903 Segmental and somatic dysfunction of lumbar region: Secondary | ICD-10-CM | POA: Diagnosis not present

## 2016-05-03 DIAGNOSIS — O9989 Other specified diseases and conditions complicating pregnancy, childbirth and the puerperium: Secondary | ICD-10-CM

## 2016-05-03 LAB — CULTURE, BETA STREP (GROUP B ONLY)

## 2016-05-03 NOTE — BH Specialist Note (Signed)
Session Start time: 10:00   End Time: 10:18 Total Time:  18 minutes Type of Service: [place]: Yes.     Interpreter Name & Language: Sabino Dick # [facility] Visits July 2017-June 2018: 1st  SUBJECTIVE: Diana Torres is a 26 y.o. female  Pt. was referred by Dr Nehemiah Settle for:  anxiety. Pt. reports the following symptoms/concerns: Pt states that her primary symptoms are having difficulty relaxing and restlessness, that she attributes to physical discomfort in third trimester; pt says she did experience postpartum depression after first pregnancy. Pt does not feel increase in symptoms of anxiety are causing any current issues for her.  Duration of problem:  Increase in past two weeks Severity: mild Previous treatment: none  OBJECTIVE: Mood: Appropriate & Affect: Appropriate Risk of harm to self or others: No known risk of harm to self or others Assessments administered: PHQ9: 5/ GAD7: 10  LIFE CONTEXT:  Family & Social: Lives with husband and 51yo son; has supportive family and friends locally  School/ Work: Undetermined  Self-Care: No known issues with self-care Life changes: Current pregnancy What is important to pt/family (values): Overall wellbeing  GOALS ADDRESSED:  -Reduce symptoms of anxiety -Prevent symptoms of depression  INTERVENTIONS: Motivational Interviewing   ASSESSMENT:  Pt currently experiencing Adjustment disorder with anxious mood.  Pt may benefit from psychoeducation and brief therapeutic intervention regarding coping with symptoms of anxiety.   PLAN: 1. F/U with behavioral health clinician: One week (brief for postpartum planning) 2. Behavioral Health meds: none 3. Behavioral recommendations:  -Read educational material regarding coping with symptoms of anxiety (and depression) 4. Referral: Brief Counseling/Psychotherapy and Psychoeducation 5. From scale of 1-10, how likely are you to follow plan: [place]:   Warm Hand Off Completed.        Depression screen [name] 2/9 05/03/2016 04/26/2016 04/07/2016 03/24/2016 03/03/2016  Decreased Interest 1 1 1 1 1   Down, Depressed, Hopeless 1 1 0 0 0  PHQ - 2 Score 2 2 1 1 1   Altered sleeping 1 1 1 1 1   Tired, decreased energy 1 1 1 2 1   Change in appetite 1 1 0 0 0  Feeling bad or failure about yourself  0 0 0 0 0  Trouble concentrating 0 0 0 0 0  Moving slowly or fidgety/restless 0 0 0 0 0  Suicidal thoughts 0 0 0 0 0  PHQ-9 Score 5 5 3 4 3    GAD 7 : Generalized Anxiety Score 05/03/2016 04/26/2016 04/07/2016 03/24/2016  Nervous, Anxious, on Edge 1 1 1 1   Control/stop worrying 1 1 1 1   Worry too much - different things 1 1 1 1   Trouble relaxing 3 1 1 1   Restless 3 1 1 2   Easily annoyed or irritable 1 1 1 1   Afraid - awful might happen 0 0 0 0  Total GAD 7 Score 10 6 6  7

## 2016-05-03 NOTE — Patient Instructions (Signed)
Laparoscopic Tubal Ligation Introduction Laparoscopic tubal ligation is a procedure to close the fallopian tubes. This is done so that you cannot get pregnant. When the fallopian tubes are closed, the eggs that your ovaries release cannot enter the uterus, and sperm cannot reach the released eggs. A laparoscopic tubal ligation is sometimes called "getting your tubes tied." You should not have this procedure if you want to get pregnant someday or if you are unsure about having more children. Tell a health care provider about:  Any allergies you have.  All medicines you are taking, including vitamins, herbs, eye drops, creams, and over-the-counter medicines.  Any problems you or family members have had with anesthetic medicines.  Any blood disorders you have.  Any surgeries you have had.  Any medical conditions you have.  Whether you are pregnant or may be pregnant.  Any past pregnancies. What are the risks? Generally, this is a safe procedure. However, problems may occur, including:  Infection.  Bleeding.  Injury to surrounding organs.  Side effects from anesthetics.  Failure of the procedure. This procedure can increase your risk of a kind of pregnancy in which a fertilized egg attaches to the outside of the uterus (ectopic pregnancy). What happens before the procedure?  Ask your health care provider about:  Changing or stopping your regular medicines. This is especially important if you are taking diabetes medicines or blood thinners.  Taking medicines such as aspirin and ibuprofen. These medicines can thin your blood. Do not take these medicines before your procedure if your health care provider instructs you not to.  Follow instructions from your health care provider about eating and drinking restrictions.  Plan to have someone take you home after the procedure.  If you go home right after the procedure, plan to have someone with you for 24 hours. What happens during  the procedure?  You will be given one or more of the following:  A medicine to help you relax (sedative).  A medicine to numb the area (local anesthetic).  A medicine to make you fall asleep (general anesthetic).  A medicine that is injected into an area of your body to numb everything below the injection site (regional anesthetic).  An IV tube will be inserted into one of your veins. It will be used to give you medicines and fluids during the procedure.  Your bladder may be emptied with a small tube (catheter).  If you have been given a general anesthetic, a tube will be put down your throat to help you breathe.  Two small cuts (incisions) will be made in your lower abdomen and near your belly button.  Your abdomen will be inflated with a gas. This will let the surgeon see better and will give the surgeon room to work.  A thin, lighted tube (laparoscope) with a camera attached will be inserted into your abdomen through one of the incisions. Small instruments will be inserted through the other incision.  The fallopian tubes will be tied off, burned (cauterized), or blocked with a clip, ring, or clamp. A small portion in the center of each fallopian tube may be removed.  The gas will be released from the abdomen.  The incisions will be closed with stitches (sutures).  A bandage (dressing) will be placed over the incisions. The procedure may vary among health care providers and hospitals. What happens after the procedure?  Your blood pressure, heart rate, breathing rate, and blood oxygen level will be monitored often until the medicines you  were given have worn off.  You will be given medicine to help with pain, nausea, and vomiting as needed. This information is not intended to replace advice given to you by your health care provider. Make sure you discuss any questions you have with your health care provider. Document Released: 06/28/2000 Document Revised: 08/28/2015 Document  Reviewed: 03/02/2015  2017 Elsevier

## 2016-05-03 NOTE — Progress Notes (Signed)
   PRENATAL VISIT NOTE  Subjective:  Diana Torres is a 26 y.o. G3P1011 at [redacted]w[redacted]d being seen today for ongoing prenatal care.  She is currently monitored for the following issues for this low-risk pregnancy and has Hearing impaired; Supervision of low-risk pregnancy; and Labile hypertension on her problem list.  Patient reports right hip pain after fall. Fairly constant. Worse with transitional movement..  Contractions: Irritability. Vag. Bleeding: None.  Movement: Present. Denies leaking of fluid.   The following portions of the patient's history were reviewed and updated as appropriate: allergies, current medications, past family history, past medical history, past social history, past surgical history and problem list. Problem list updated.  Objective:   Vitals:   05/03/16 0928  BP: 120/76  Pulse: (!) 103  Weight: 188 lb (85.3 kg)    Fetal Status: Fetal Heart Rate (bpm): 147 Fundal Height: 38 cm Movement: Present  Presentation: Vertex  General:  Alert, oriented and cooperative. Patient is in no acute distress.  Skin: Skin is warm and dry. No rash noted.   Cardiovascular: Normal heart rate noted  Respiratory: Normal respiratory effort, no problems with respiration noted  Abdomen: Soft, gravid, appropriate for gestational age. Pain/Pressure: Present     Pelvic:  Cervical exam performed Dilation: 1 Effacement (%): 50 Station: -3  MSK: Restriction, tenderness, tissue texture changes, and paraspinal spasm in the right lumbar spine  Neuro: Moves all four extremities with no focal neurological deficit  Extremities: Normal range of motion.  Edema: None  Mental Status: Normal mood and affect. Normal behavior. Normal judgment and thought content.   OSE: Head   Cervical   Thoracic   Rib   Lumbar  L5 FSRR  Sacrum  L/L torsion  Pelvis  right ant    Assessment and Plan:  Pregnancy: G3P1011 at [redacted]w[redacted]d  1. Encounter for supervision of low-risk pregnancy in third trimester FHT and FH  normal  2. Right hip pain 3. Somatic dysfunction of lumbar region 4. Somatic dysfunction of sacral region 5. Somatic dysfunction of pelvis region OMT done after patient permission. HVLA utilized. Pt tolerated well   Term labor symptoms and general obstetric precautions including but not limited to vaginal bleeding, contractions, leaking of fluid and fetal movement were reviewed in detail with the patient. Please refer to After Visit Summary for other counseling recommendations.  Return in about 1 week (around 05/10/2016) for OB f/u.  Truett Mainland, DO

## 2016-05-03 NOTE — Progress Notes (Signed)
Patient reports falling on right hip during the snow recently and is still having a lot of pain; patient needs to see Roselyn Reef

## 2016-05-04 ENCOUNTER — Encounter: Payer: Self-pay | Admitting: *Deleted

## 2016-05-10 ENCOUNTER — Ambulatory Visit (INDEPENDENT_AMBULATORY_CARE_PROVIDER_SITE_OTHER): Payer: Medicaid Other | Admitting: Family Medicine

## 2016-05-10 ENCOUNTER — Ambulatory Visit: Payer: Self-pay | Admitting: Clinical

## 2016-05-10 VITALS — BP 117/82 | HR 84 | Wt 188.2 lb

## 2016-05-10 DIAGNOSIS — Z3493 Encounter for supervision of normal pregnancy, unspecified, third trimester: Secondary | ICD-10-CM

## 2016-05-10 DIAGNOSIS — F4323 Adjustment disorder with mixed anxiety and depressed mood: Secondary | ICD-10-CM

## 2016-05-10 DIAGNOSIS — M25551 Pain in right hip: Secondary | ICD-10-CM

## 2016-05-10 NOTE — BH Specialist Note (Signed)
Session Start time: 1:10   End Time: 1:16 Total Time:  6 minutes Type of Service: [place]: Yes.     Interpreter Name & [place] # [facility] Visits July 2017-June 2018: 2nd  SUBJECTIVE: Diana Torres is a 26 y.o. female  Pt. was referred by f/u for:  anxiety and depression. Pt. reports the following symptoms/concerns: Pt attributes increase in anxiety to late pregnancy; excited for baby to be born, and no concerns. Duration of problem:  Three weeks Severity: mild Previous treatment: none  OBJECTIVE: Mood: Appropriate & Affect: Appropriate Risk of harm to self or others: No known risk of harm to self or others Assessments administered: PHQ9: 5/ GAD7: 10  LIFE CONTEXT:  Family & Social: Lives with husband and 20yo son; supportive friends and family  School/ Work: Undetermined  Self-Care: No issues with self-care Life changes: Current pregnancy What is important to pt/family (values): Overall wellbeing  GOALS ADDRESSED:  -Reduce symptoms of anxiety -Prevent symptoms of depression  INTERVENTIONS: Supportive   ASSESSMENT:  Pt currently experiencing Adjustment disorder with anxious mood.  Pt may benefit from community resources and psychoeducation regarding preparing for postpartum.   PLAN: 1. F/U with behavioral health clinician: As needed 2. Behavioral Health meds: none 3. Behavioral recommendations:  -Take home Postpartum Planner today; consider using as a guide to finalize preparations for postpartum, as well as consider mom support/breastfeeding support classes after birth -Consider strategies discussed in office visit for additional self-coping with anxiety 4. Referral: Supportive Counseling 5. From scale of 1-10, how likely are you to follow plan: [place]: no  Depression screen [facility] 2/9 05/03/2016 04/26/2016 04/07/2016 03/24/2016 03/03/2016  Decreased Interest 1 1  1 1 1   Down, Depressed, Hopeless 1 1 0 0 0  PHQ - 2 Score 2 2 1 1 1   Altered sleeping 1 1 1 1 1   Tired, decreased energy 1 1 1 2 1   Change in appetite 1 1 0 0 0  Feeling bad or failure about yourself  0 0 0 0 0  Trouble concentrating 0 0 0 0 0  Moving slowly or fidgety/restless 0 0 0 0 0  Suicidal thoughts 0 0 0 0 0  PHQ-9 Score 5 5 3 4 3    GAD 7 : Generalized Anxiety Score 05/03/2016 04/26/2016 04/07/2016 03/24/2016  Nervous, Anxious, on Edge 1 1 1 1   Control/stop worrying 1 1 1 1   Worry too much - different things 1 1 1 1   Trouble relaxing 3 1 1 1   Restless 3 1 1 2   Easily annoyed or irritable 1 1 1 1   Afraid - awful might happen 0 0 0 0  Total GAD 7 Score 10 6 6  7

## 2016-05-10 NOTE — Progress Notes (Signed)
   PRENATAL VISIT NOTE  Subjective:  Diana Torres is a 26 y.o. G3P1011 at [redacted]w[redacted]d being seen today for ongoing prenatal care.  She is currently monitored for the following issues for this low-risk pregnancy and has Hearing impaired; Supervision of low-risk pregnancy; and Labile hypertension on her problem list.  Patient reports R hip pain not any better. Worse with walking..  Contractions: Irritability. Vag. Bleeding: None.  Movement: Present. Denies leaking of fluid.   The following portions of the patient's history were reviewed and updated as appropriate: allergies, current medications, past family history, past medical history, past social history, past surgical history and problem list. Problem list updated.  Objective:   Vitals:   05/10/16 1254  BP: 117/82  Pulse: 84  Weight: 188 lb 3.2 oz (85.4 kg)    Fetal Status: Fetal Heart Rate (bpm): 132 Fundal Height: 39 cm Movement: Present  Presentation: Vertex  General:  Alert, oriented and cooperative. Patient is in no acute distress.  Skin: Skin is warm and dry. No rash noted.   Cardiovascular: Normal heart rate noted  Respiratory: Normal respiratory effort, no problems with respiration noted  Abdomen: Soft, gravid, appropriate for gestational age. Pain/Pressure: Present     Pelvic:  Cervical exam performed Dilation: 1.5 Effacement (%): 50 Station: -3  Extremities: Normal range of motion.  Edema: None  Mental Status: Normal mood and affect. Normal behavior. Normal judgment and thought content.   Assessment and Plan:  Pregnancy: G3P1011 at [redacted]w[redacted]d  1. Encounter for supervision of low-risk pregnancy in third trimester FHT and Fh normal.   2. Right hip pain Isometric exercises taught for hips.    Term labor symptoms and general obstetric precautions including but not limited to vaginal bleeding, contractions, leaking of fluid and fetal movement were reviewed in detail with the patient. Please refer to After Visit Summary for  other counseling recommendations.  No Follow-up on file.   Truett Mainland, DO

## 2016-05-19 ENCOUNTER — Encounter (HOSPITAL_COMMUNITY): Payer: Self-pay | Admitting: *Deleted

## 2016-05-19 ENCOUNTER — Inpatient Hospital Stay (HOSPITAL_COMMUNITY)
Admission: AD | Admit: 2016-05-19 | Discharge: 2016-05-19 | Disposition: A | Discharge status: Home / Self Care | Payer: Medicaid Other | Source: Ambulatory Visit | Attending: Obstetrics and Gynecology | Admitting: Obstetrics and Gynecology

## 2016-05-19 ENCOUNTER — Ambulatory Visit (INDEPENDENT_AMBULATORY_CARE_PROVIDER_SITE_OTHER): Payer: Medicaid Other | Admitting: Advanced Practice Midwife

## 2016-05-19 VITALS — BP 126/88 | HR 126 | Wt 188.7 lb

## 2016-05-19 DIAGNOSIS — O368130 Decreased fetal movements, third trimester, not applicable or unspecified: Secondary | ICD-10-CM

## 2016-05-19 DIAGNOSIS — R030 Elevated blood-pressure reading, without diagnosis of hypertension: Secondary | ICD-10-CM | POA: Diagnosis present

## 2016-05-19 DIAGNOSIS — O9989 Other specified diseases and conditions complicating pregnancy, childbirth and the puerperium: Secondary | ICD-10-CM

## 2016-05-19 DIAGNOSIS — Z3A39 39 weeks gestation of pregnancy: Secondary | ICD-10-CM | POA: Diagnosis not present

## 2016-05-19 DIAGNOSIS — R42 Dizziness and giddiness: Secondary | ICD-10-CM

## 2016-05-19 DIAGNOSIS — I10 Essential (primary) hypertension: Secondary | ICD-10-CM

## 2016-05-19 DIAGNOSIS — O163 Unspecified maternal hypertension, third trimester: Secondary | ICD-10-CM | POA: Diagnosis not present

## 2016-05-19 DIAGNOSIS — N898 Other specified noninflammatory disorders of vagina: Secondary | ICD-10-CM

## 2016-05-19 DIAGNOSIS — O368131 Decreased fetal movements, third trimester, fetus 1: Secondary | ICD-10-CM

## 2016-05-19 DIAGNOSIS — R0989 Other specified symptoms and signs involving the circulatory and respiratory systems: Secondary | ICD-10-CM

## 2016-05-19 DIAGNOSIS — O26893 Other specified pregnancy related conditions, third trimester: Secondary | ICD-10-CM

## 2016-05-19 LAB — COMPREHENSIVE METABOLIC PANEL
ALBUMIN: 3.2 g/dL — AB (ref 3.5–5.0)
ALT: 10 U/L — AB (ref 14–54)
AST: 18 U/L (ref 15–41)
Alkaline Phosphatase: 221 U/L — ABNORMAL HIGH (ref 38–126)
Anion gap: 9 (ref 5–15)
BUN: 11 mg/dL (ref 6–20)
CHLORIDE: 102 mmol/L (ref 101–111)
CO2: 20 mmol/L — AB (ref 22–32)
Calcium: 8.8 mg/dL — ABNORMAL LOW (ref 8.9–10.3)
Creatinine, Ser: 0.64 mg/dL (ref 0.44–1.00)
GFR calc Af Amer: >60 mL/min (ref >60)
GFR calc non Af Amer: >60 mL/min (ref >60)
GLUCOSE: 90 mg/dL (ref 65–99)
POTASSIUM: 3.8 mmol/L (ref 3.5–5.1)
Sodium: 131 mmol/L — ABNORMAL LOW (ref 135–145)
Total Bilirubin: 0.4 mg/dL (ref 0.3–1.2)
Total Protein: 7.2 g/dL (ref 6.5–8.1)

## 2016-05-19 LAB — TYPE AND SCREEN
ABO/RH(D): O POS
Antibody Screen: NEGATIVE

## 2016-05-19 LAB — URINALYSIS, ROUTINE W REFLEX MICROSCOPIC
BILIRUBIN URINE: NEGATIVE
GLUCOSE, UA: NEGATIVE mg/dL
HGB URINE DIPSTICK: NEGATIVE
KETONES UR: NEGATIVE mg/dL
LEUKOCYTES UA: NEGATIVE
Nitrite: NEGATIVE
PH: 5 (ref 5.0–8.0)
Protein, ur: NEGATIVE mg/dL
Specific Gravity, Urine: 1.026 (ref 1.005–1.030)

## 2016-05-19 LAB — RAPID URINE DRUG SCREEN, HOSP PERFORMED
AMPHETAMINES: NOT DETECTED
BENZODIAZEPINES: NOT DETECTED
Barbiturates: NOT DETECTED
COCAINE: NOT DETECTED
OPIATES: NOT DETECTED
TETRAHYDROCANNABINOL: NOT DETECTED

## 2016-05-19 LAB — CBC
HEMATOCRIT: 34.9 % — AB (ref 36.0–46.0)
Hemoglobin: 11.8 g/dL — ABNORMAL LOW (ref 12.0–15.0)
MCH: 28.8 pg (ref 26.0–34.0)
MCHC: 33.8 g/dL (ref 30.0–36.0)
MCV: 85.1 fL (ref 78.0–100.0)
PLATELETS: 199 10*3/uL (ref 150–400)
RBC: 4.1 MIL/uL (ref 3.87–5.11)
RDW: 14.7 % (ref 11.5–15.5)
WBC: 11.3 10*3/uL — AB (ref 4.0–10.5)

## 2016-05-19 LAB — GLUCOSE, CAPILLARY: Glucose-Capillary: 88 mg/dL (ref 65–99)

## 2016-05-19 MED ORDER — DEXTROSE IN LACTATED RINGERS 5 % IV SOLN
Freq: Once | INTRAVENOUS | Status: AC
  Administered 2016-05-19: 14:00:00 via INTRAVENOUS

## 2016-05-19 MED ORDER — SODIUM CHLORIDE 0.9 % IV SOLN
INTRAVENOUS | Status: DC
  Administered 2016-05-19: 13:00:00 via INTRAVENOUS

## 2016-05-19 NOTE — Discharge Instructions (Signed)
Dizziness Dizziness is a common problem. It makes you feel unsteady or lightheaded. You may feel like you are about to pass out (faint). Dizziness can lead to injury if you stumble or fall. Anyone can get dizzy, but dizziness is more common in older adults. This condition can be caused by a number of things, including:  Medicines.  Dehydration.  Illness. Follow these instructions at home: Following these instructions may help with your condition: Eating and drinking  Drink enough fluid to keep your pee (urine) clear or pale yellow. This helps to keep you from getting dehydrated. Try to drink more clear fluids, such as water.  Do not drink alcohol.  Limit how much caffeine you drink or eat if told by your doctor.  Limit how much salt you drink or eat if told by your doctor. Activity  Avoid making quick movements.  When you stand up from sitting in a chair, steady yourself until you feel okay.  In the morning, first sit up on the side of the bed. When you feel okay, stand slowly while you hold onto something. Do this until you know that your balance is fine.  Move your legs often if you need to stand in one place for a long time. Tighten and relax your muscles in your legs while you are standing.  Do not drive or use heavy machinery if you feel dizzy.  Avoid bending down if you feel dizzy. Place items in your home so that they are easy for you to reach without leaning over. Lifestyle  Do not use any tobacco products, including cigarettes, chewing tobacco, or electronic cigarettes. If you need help quitting, ask your doctor.  Try to lower your stress level, such as with yoga or meditation. Talk with your doctor if you need help. General instructions  Watch your dizziness for any changes.  Take medicines only as told by your doctor. Talk with your doctor if you think that your dizziness is caused by a medicine that you are taking.  Tell a friend or a family member that you are  feeling dizzy. If he or she notices any changes in your behavior, have this person call your doctor.  Keep all follow-up visits as told by your doctor. This is important. Contact a doctor if:  Your dizziness does not go away.  Your dizziness or light-headedness gets worse.  You feel sick to your stomach (nauseous).  You have trouble hearing.  You have new symptoms.  You are unsteady on your feet or you feel like the room is spinning. Get help right away if:  You throw up (vomit) or have diarrhea and are unable to eat or drink anything.  You have trouble:  Talking.  Walking.  Swallowing.  Using your arms, hands, or legs.  You feel generally weak.  You are not thinking clearly or you have trouble forming sentences. It may take a friend or family member to notice this.  You have:  Chest pain.  Pain in your belly (abdomen).  Shortness of breath.  Sweating.  Your vision changes.  You are bleeding.  You have a headache.  You have neck pain or a stiff neck.  You have a fever. This information is not intended to replace advice given to you by your health care provider. Make sure you discuss any questions you have with your health care provider. Document Released: 03/11/2011 Document Revised: 08/28/2015 Document Reviewed: 03/18/2014 Elsevier Interactive Patient Education  2017 Advance of  Pregnancy The third trimester is from week 29 through week 42, months 7 through 9. This trimester is when your unborn baby (fetus) is growing very fast. At the end of the ninth month, the unborn baby is about 20 inches in length. It weighs about 6-10 pounds. Follow these instructions at home:  Avoid all smoking, herbs, and alcohol. Avoid drugs not approved by your doctor.  Do not use any tobacco products, including cigarettes, chewing tobacco, and electronic cigarettes. If you need help quitting, ask your doctor. You may get counseling or other support to  help you quit.  Only take medicine as told by your doctor. Some medicines are safe and some are not during pregnancy.  Exercise only as told by your doctor. Stop exercising if you start having cramps.  Eat regular, healthy meals.  Wear a good support bra if your breasts are tender.  Do not use hot tubs, steam rooms, or saunas.  Wear your seat belt when driving.  Avoid raw meat, uncooked cheese, and liter boxes and soil used by cats.  Take your prenatal vitamins.  Take 1500-2000 milligrams of calcium daily starting at the 20th week of pregnancy until you deliver your baby.  Try taking medicine that helps you poop (stool softener) as needed, and if your doctor approves. Eat more fiber by eating fresh fruit, vegetables, and whole grains. Drink enough fluids to keep your pee (urine) clear or pale yellow.  Take warm water baths (sitz baths) to soothe pain or discomfort caused by hemorrhoids. Use hemorrhoid cream if your doctor approves.  If you have puffy, bulging veins (varicose veins), wear support hose. Raise (elevate) your feet for 15 minutes, 3-4 times a day. Limit salt in your diet.  Avoid heavy lifting, wear low heels, and sit up straight.  Rest with your legs raised if you have leg cramps or low back pain.  Visit your dentist if you have not gone during your pregnancy. Use a soft toothbrush to brush your teeth. Be gentle when you floss.  You can have sex (intercourse) unless your doctor tells you not to.  Do not travel far distances unless you must. Only do so with your doctor's approval.  Take prenatal classes.  Practice driving to the hospital.  Pack your hospital bag.  Prepare the baby's room.  Go to your doctor visits. Get help if:  You are not sure if you are in labor or if your water has broken.  You are dizzy.  You have mild cramps or pressure in your lower belly (abdominal).  You have a nagging pain in your belly area.  You continue to feel sick to  your stomach (nauseous), throw up (vomit), or have watery poop (diarrhea).  You have bad smelling fluid coming from your vagina.  You have pain with peeing (urination). Get help right away if:  You have a fever.  You are leaking fluid from your vagina.  You are spotting or bleeding from your vagina.  You have severe belly cramping or pain.  You lose or gain weight rapidly.  You have trouble catching your breath and have chest pain.  You notice sudden or extreme puffiness (swelling) of your face, hands, ankles, feet, or legs.  You have not felt the baby move in over an hour.  You have severe headaches that do not go away with medicine.  You have vision changes. This information is not intended to replace advice given to you by your health care provider. Make sure you  discuss any questions you have with your health care provider. Document Released: 06/16/2009 Document Revised: 08/28/2015 Document Reviewed: 05/23/2012 Elsevier Interactive Patient Education  2017 [employer].

## 2016-05-19 NOTE — MAU Provider Note (Signed)
Chief Complaint:  No chief complaint on file.   First Provider Initiated Contact with Patient 05/19/16 0000000     HPI: Diana Torres is a 26 y.o. G3P1011 at 36w2dwho presents to maternity admissions reporting hypertension, intermittent dizziness and decreased fetal movement.  Also has soreness and intermittent sharp pains in pelvis, low back and hips..This has been an ongoing problem.   She denies LOF, vaginal bleeding, vaginal itching/burning, urinary symptoms, h/a, dizziness, n/v, diarrhea, constipation or fever/chills.  She denies headache, visual changes or RUQ abdominal pain.  Hypertension  This is a new problem. The current episode started today. Pertinent negatives include no anxiety, blurred vision, chest pain, headaches, palpitations, peripheral edema or shortness of breath. There are no associated agents to hypertension. There are no known risk factors for coronary artery disease. Past treatments include nothing. There are no compliance problems.    RN note: Pt presents from clinic for eval of elevated B/P's and decreased fetal movement. Pt reports pelvic, hips and back are hurting.   Past Medical History: Past Medical History:  Diagnosis Date  . Deaf   . History of broken nose   . 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 Mass    cyst/left upper abd quad  . Miscarriage    pt states had difficulty with high BP prior to miscarriage and afterwards till infection was healed  . PONV (postoperative nausea and vomiting)   . UTI (lower urinary tract infection)     Past obstetric history: OB History  Gravida Para Term Preterm AB Living  3 1 1   1 1   SAB TAB Ectopic Multiple Live Births  1       1    # Outcome Date GA Lbr Len/2nd Weight Sex Delivery Anes PTL Lv  3 Current           2 SAB 02/18/15 [redacted]w[redacted]d         1 Term 09/23/12 [redacted]w[redacted]d 15:04 / 02:15 7 lb 6 oz (3.345 kg) M Vag-Spont EPI  LIV      Past Surgical History: Past Surgical History:  Procedure Laterality Date  . COCHLEAR IMPLANT     . 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 RHINOPLASTY    . right foot  2016   cyst removed  . TONSILLECTOMY      Family History: Family History  Problem Relation Age of Onset  . Hearing loss Mother   . Hypertension Mother   . Alcohol abuse Father   . Hearing loss Father     Social History: Social History  Substance Use Topics  . Smoking status: Never Smoker  . Smokeless tobacco: Never Used  . Alcohol use No    Allergies:  Allergies  Allergen Reactions  . Codeine Itching and Rash    Meds:  Prescriptions Prior to Admission  Medication Sig Dispense Refill Last Dose  . prenatal vitamin w/FE, FA (PRENATAL 1 + 1) 27-1 MG TABS tablet Take 1 tablet by mouth daily at 12 noon.   05/19/2016 at Unknown time    I have reviewed patient's Past Medical Hx, Surgical Hx, Family Hx, Social Hx, medications and allergies.   ROS:  Review of Systems  Eyes: Negative for blurred vision.  Respiratory: Negative for shortness of breath.   Cardiovascular: Negative for chest pain and palpitations.  Neurological: Negative for headaches.   Other systems negative  Physical Exam  Patient Vitals for the past 24 hrs:  BP Temp Temp src Pulse Resp SpO2 Height Weight  05/19/16 1202 129/71 - - 99 - - - -  05/19/16 1146 118/75 - - 105 - 98 % - -  05/19/16 1117 131/87 - - 112 - - - -  05/19/16 1114 123/81 98.1 F (36.7 C) Oral (!) 121 20 100 % 5' 3.5" (1.613 m) 189 lb (85.7 kg)   Vitals:   05/19/16 1247 05/19/16 1317 05/19/16 1501 05/19/16 1525  BP: 131/91 121/71 120/72 97/75  Pulse: 100 92 71 80  Resp:      Temp:      TempSrc:      SpO2: 100% 96%    Weight:      Height:        Constitutional: Well-developed, well-nourished female in no acute distress.  Cardiovascular: normal rate and rhythm Respiratory: normal effort, clear to  auscultation bilaterally GI: Abd soft, non-tender, gravid appropriate for gestational age.   No rebound or guarding. MS: Extremities nontender, no edema, normal ROM Neurologic: Alert and oriented x 4.  GU: Neg CVAT.  FHT:  Baseline 140 , moderate variability, accelerations present, no decelerations Contractions:  Rare   Labs: Results for orders placed or performed during the hospital encounter of 05/19/16 (from the past 24 hour(s))  Urine rapid drug screen (hosp performed)     Status: None   Collection Time: 05/19/16 10:55 AM  Result Value Ref Range   Opiates NONE DETECTED NONE DETECTED   Cocaine NONE DETECTED NONE DETECTED   Benzodiazepines NONE DETECTED NONE DETECTED   Amphetamines NONE DETECTED NONE DETECTED   Tetrahydrocannabinol NONE DETECTED NONE DETECTED   Barbiturates NONE DETECTED NONE DETECTED  Urinalysis, Routine w reflex microscopic     Status: Abnormal   Collection Time: 05/19/16 10:55 AM  Result Value Ref Range   Color, Urine YELLOW YELLOW   APPearance HAZY (A) CLEAR   Specific Gravity, Urine 1.026 1.005 - 1.030   pH 5.0 5.0 - 8.0   Glucose, UA NEGATIVE NEGATIVE mg/dL   Hgb urine dipstick NEGATIVE NEGATIVE   Bilirubin Urine NEGATIVE NEGATIVE   Ketones, ur NEGATIVE NEGATIVE mg/dL   Protein, ur NEGATIVE NEGATIVE mg/dL   Nitrite NEGATIVE NEGATIVE   Leukocytes, UA NEGATIVE NEGATIVE  CBC     Status: Abnormal   Collection Time: 05/19/16 11:33 AM  Result Value Ref Range   WBC 11.3 (H) 4.0 - 10.5 K/uL   RBC 4.10 3.87 - 5.11 MIL/uL   Hemoglobin 11.8 (L) 12.0 - 15.0 g/dL   HCT 34.9 (L) 36.0 - 46.0 %   MCV 85.1 78.0 - 100.0 fL   MCH 28.8 26.0 - 34.0 pg   MCHC 33.8 30.0 - 36.0 g/dL   RDW 14.7 11.5 - 15.5 %   Platelets 199 150 - 400 K/uL  Comprehensive metabolic panel     Status: Abnormal   Collection Time: 05/19/16 11:33 AM  Result Value Ref Range   Sodium 131 (L) 135 - 145 mmol/L   Potassium 3.8 3.5 - 5.1 mmol/L   Chloride 102 101 - 111 mmol/L   CO2 20 (L) 22 -  32 mmol/L   Glucose, Bld 90 65 - 99 mg/dL   BUN 11 6 - 20 mg/dL   Creatinine, Ser 0.64 0.44 - 1.00 mg/dL   Calcium 8.8 (L) 8.9 - 10.3 mg/dL   Total Protein 7.2 6.5 - 8.1 g/dL   Albumin 3.2 (L) 3.5 - 5.0 g/dL  AST 18 15 - 41 U/L   ALT 10 (L) 14 - 54 U/L   Alkaline Phosphatase 221 (H) 38 - 126 U/L   Total Bilirubin 0.4 0.3 - 1.2 mg/dL   GFR calc non Af Amer >60 >60 mL/min   GFR calc Af Amer >60 >60 mL/min   Anion gap 9 5 - 15   O/POS/-- (07/25 1014)  Imaging:  No results found.  MAU Course/MDM: I have ordered labs and reviewed results. Labs are normal NST reviewed  Dr Baron Sane went in to evaluate patient EKG is normal with mild tachycardia Will give two liters of IVF for rehydration Patient wants to be induced Will schedule followup in clinic per Dr Baron Sane  Assessment: 1. Labile hypertension   2.      Reassuring Category 1 FHR tracing 3.      No evidence of preeclampsia  Plan: Discharge home Labor precautions and fetal kick counts Follow up in Office for prenatal visits and recheck of status  Encouraged to return here or to other Urgent Care/ED if she develops worsening of symptoms, increase in pain, fever, or other concerning symptoms.   Pt stable at time of discharge.  Hansel Feinstein CNM, MSN Certified Nurse-Midwife 05/19/2016 12:28 PM

## 2016-05-19 NOTE — Progress Notes (Signed)
   PRENATAL VISIT NOTE  Subjective:  Diana Torres is a 26 y.o. G3P1011 at [redacted]w[redacted]d being seen today for ongoing prenatal care.  She is currently monitored for the following issues for this low-risk pregnancy and has Hearing impaired; Supervision of low-risk pregnancy; and Labile hypertension on her problem list.  Patient reports backache and contractions every 5-10 minutes, leaking scant watery fluid x 1 week, decreased fetal movement, increase SOB and dizziness today. Denies HA, vision changes epigastric pain. Contractions: Irregular. Vag. Bleeding: None.  Movement: (!) Decreased.  The following portions of the patient's history were reviewed and updated as appropriate: allergies, current medications, past family history, past medical history, past social history, past surgical history and problem list. Problem list updated.  Objective:   Vitals:   05/19/16 1007 05/19/16 1009  BP: 140/90 126/88  Pulse: (!) 130 (!) 126  Weight: 188 lb 11.2 oz (85.6 kg)     Fetal Status: Fetal Heart Rate (bpm): 130 Fundal Height: 39 cm Movement: (!) Decreased  Presentation: Vertex  General:  Alert, oriented and cooperative. Patient is in mild acute distress.  Skin: Skin is warm and dry. No rash noted. No pallor  Cardiovascular: tachycardic  Respiratory: Normal respiratory effort, no problems with respiration noted  Abdomen: Soft, gravid, appropriate for gestational age. Pain/Pressure: Present     Pelvic:  Cervical exam performed Dilation: 1.5 Effacement (%): 50 Station: -3. Neg pooling. Small amount of clear mucus and physiologic discharge.   Extremities: Normal range of motion.  Edema: None  Mental Status: Normal mood and affect. Normal behavior. Normal judgment and thought content.   Fern slide negative  Assessment and Plan:  Pregnancy: G3P1011 at [redacted]w[redacted]d 1. Hypertension affecting pregnancy, third trimester - Will escort pt to MAU for Pre-E eval. Report given to Noni Saupe and MAU RN.   2.  Decreased fetal movement, third trimester, fetus 1 - NST  3. Vaginal discharge during pregnancy in third trimester - No evidence of ROM  4. Dizziness - Eval in MAU    [place], CNM

## 2016-05-19 NOTE — MAU Note (Signed)
Pt presents from clinic for eval of elevated B/P's and decreased fetal movement. Pt reports pelvic, hips and back are hurting.

## 2016-05-19 NOTE — Patient Instructions (Signed)
Hypertension During Pregnancy °Hypertension, commonly called high blood pressure, is when the force of blood pumping through your arteries is too strong. Arteries are blood vessels that carry blood from the heart throughout the body. Hypertension during pregnancy can cause problems for you and your baby. Your baby may be born early (prematurely) or may not weigh as much as he or she should at birth. Very bad cases of hypertension during pregnancy can be life-threatening. °Different types of hypertension can occur during pregnancy. These include: °· Chronic hypertension. This happens when: °¨ You have hypertension before pregnancy and it continues during pregnancy. °¨ You develop hypertension before you are [redacted] weeks pregnant, and it continues during pregnancy. °· Gestational hypertension. This is hypertension that develops after the 20th week of pregnancy. °· Preeclampsia, also called toxemia of pregnancy. This is a very serious type of hypertension that develops only during pregnancy. It affects the whole body, and it can be very dangerous for you and your baby. °Gestational hypertension and preeclampsia usually go away within 6 weeks after your baby is born. Women who have hypertension during pregnancy have a greater chance of developing hypertension later in life or during future pregnancies. °What are the causes? °The exact cause of hypertension is not known. °What increases the risk? °There are certain factors that make it more likely for you to develop hypertension during pregnancy. These include: °· Having hypertension during a previous pregnancy or prior to pregnancy. °· Being overweight. °· Being older than age 40. °· Being pregnant for the first time or being pregnant with more than one baby. °· Becoming pregnant using fertilization methods such as IVF (in vitro fertilization). °· Having diabetes, kidney problems, or systemic lupus erythematosus. °· Having a family history of hypertension. °What are the  signs or symptoms? °Chronic hypertension and gestational hypertension rarely cause symptoms. Preeclampsia causes symptoms, which may include: °· Increased protein in your urine. Your health care provider will check for this at every visit before you give birth (prenatal visit). °· Severe headaches. °· Sudden weight gain. °· Swelling of the hands, face, legs, and feet. °· Nausea and vomiting. °· Vision problems, such as blurred or double vision. °· Numbness in the face, arms, legs, and feet. °· Dizziness. °· Slurred speech. °· Sensitivity to bright lights. °· Abdominal pain. °· Convulsions. °How is this diagnosed? °You may be diagnosed with hypertension during a routine prenatal exam. At each prenatal visit, you may: °· Have a urine test to check for high amounts of protein in your urine. °· Have your blood pressure checked. A blood pressure reading is recorded as two numbers, such as "120 over 80" (or 120/80). The first ("top") number is called the systolic pressure. It is a measure of the pressure in your arteries when your heart beats. The second ("bottom") number is called the diastolic pressure. It is a measure of the pressure in your arteries as your heart relaxes between beats. Blood pressure is measured in a unit called mm Hg. A normal blood pressure reading is: °¨ Systolic: below 120. °¨ Diastolic: below 80. °The type of hypertension that you are diagnosed with depends on your test results and when your symptoms developed. °· Chronic hypertension is usually diagnosed before 20 weeks of pregnancy. °· Gestational hypertension is usually diagnosed after 20 weeks of pregnancy. °· Hypertension with high amounts of protein in the urine is diagnosed as preeclampsia. °· Blood pressure measurements that stay above 160 systolic, or above 110 diastolic, are signs of severe preeclampsia. °  How is this treated? °Treatment for hypertension during pregnancy varies depending on the type of hypertension you have and how  serious it is. °· If you take medicines called ACE inhibitors to treat chronic hypertension, you may need to switch medicines. ACE inhibitors should not be taken during pregnancy. °· If you have gestational hypertension, you may need to take blood pressure medicine. °· If you are at risk for preeclampsia, your health care provider may recommend that you take a low-dose aspirin every day to prevent high blood pressure during your pregnancy. °· If you have severe preeclampsia, you may need to be hospitalized so you and your baby can be monitored closely. You may also need to take medicine (magnesium sulfate) to prevent seizures and to lower blood pressure. This medicine may be given as an injection or through an IV tube. °· In some cases, if your condition gets worse, you may need to deliver your baby early. °Follow these instructions at home: °Eating and drinking °· Drink enough fluid to keep your urine clear or pale yellow. °· Eat a healthy diet that is low in salt (sodium). Do not add salt to your food. Check food labels to see how much sodium a food or beverage contains. °Lifestyle °· Do not use any products that contain nicotine or tobacco, such as cigarettes and e-cigarettes. If you need help quitting, ask your health care provider. °· Do not use alcohol. °· Avoid caffeine. °· Avoid stress as much as possible. Rest and get plenty of sleep. °General instructions °· Take over-the-counter and prescription medicines only as told by your health care provider. °· While lying down, lie on your left side. This keeps pressure off your baby. °· While sitting or lying down, raise (elevate) your feet. Try putting some pillows under your lower legs. °· Exercise regularly. Ask your health care provider what kinds of exercise are best for you. °· Keep all prenatal and follow-up visits as told by your health care provider. This is important. °Contact a health care provider if: °· You have symptoms that your health care provider  told you may require more treatment or monitoring, such as: °¨ Fever. °¨ Vomiting. °¨ Headache. °Get help right away if: °· You have severe abdominal pain or vomiting that does not get better with treatment. °· You suddenly develop swelling in your hands, ankles, or face. °· You gain 4 lbs (1.8 kg) or more in 1 week. °· You develop vaginal bleeding, or you have blood in your urine. °· You do not feel your baby moving as much as usual. °· You have blurred or double vision. °· You have muscle twitching or sudden tightening (spasms). °· You have shortness of breath. °· Your lips or fingernails turn blue. °This information is not intended to replace advice given to you by your health care provider. Make sure you discuss any questions you have with your health care provider. °Document Released: 12/08/2010 Document Revised: 10/10/2015 Document Reviewed: 09/05/2015 °Elsevier Interactive Patient Education © 2017 [employer] ° °

## 2016-05-20 LAB — RPR: RPR Ser Ql: NONREACTIVE

## 2016-05-21 ENCOUNTER — Telehealth: Payer: Self-pay | Admitting: *Deleted

## 2016-05-21 NOTE — Telephone Encounter (Signed)
Patient left voice mail. Wants to make an appointment for ER f/u and to schedule her induction. Patient already has an appointment scheduled for Mon, 2/26 w/ Dr Gala Romney. Please return call.

## 2016-05-24 ENCOUNTER — Ambulatory Visit (INDEPENDENT_AMBULATORY_CARE_PROVIDER_SITE_OTHER): Payer: Medicaid Other | Admitting: Family Medicine

## 2016-05-24 VITALS — BP 115/75 | HR 77

## 2016-05-24 DIAGNOSIS — H9190 Unspecified hearing loss, unspecified ear: Secondary | ICD-10-CM

## 2016-05-24 DIAGNOSIS — Z3493 Encounter for supervision of normal pregnancy, unspecified, third trimester: Secondary | ICD-10-CM

## 2016-05-24 NOTE — Progress Notes (Signed)
   PRENATAL VISIT NOTE  Subjective:  Diana Torres is a 26 y.o. G3P1011 at [redacted]w[redacted]d being seen today for ongoing prenatal care.  She is currently monitored for the following issues for this low-risk pregnancy and has Hearing impaired; Supervision of low-risk pregnancy; and Labile hypertension on her problem list.  Patient reports occasional contractions.  Contractions: Irregular. Vag. Bleeding: None.  Movement: Present. Denies leaking of fluid.   The following portions of the patient's history were reviewed and updated as appropriate: allergies, current medications, past family history, past medical history, past social history, past surgical history and problem list. Problem list updated.  Objective:   Vitals:   05/24/16 1251  BP: 115/75  Pulse: 77    Fetal Status: Fetal Heart Rate (bpm): 147 Fundal Height: 39 cm Movement: Present  Presentation: Vertex  General:  Alert, oriented and cooperative. Patient is in no acute distress.  Skin: Skin is warm and dry. No rash noted.   Cardiovascular: Normal heart rate noted  Respiratory: Normal respiratory effort, no problems with respiration noted  Abdomen: Soft, gravid, appropriate for gestational age. Pain/Pressure: Present     Pelvic:  Cervical exam performed Dilation: 2 Effacement (%): 50 Station: -3  Extremities: Normal range of motion.  Edema: None  Mental Status: Normal mood and affect. Normal behavior. Normal judgment and thought content.   Assessment and Plan:  Pregnancy: G3P1011 at [redacted]w[redacted]d  1. Encounter for supervision of low-risk pregnancy in third trimester FHT and FH normal. In review of chart, HIV was never done. Will obtain. - HIV antibody (with reflex)  2. Hearing loss, unspecified hearing loss type, unspecified laterality Interpreter used.  Term labor symptoms and general obstetric precautions including but not limited to vaginal bleeding, contractions, leaking of fluid and fetal movement were reviewed in detail with the  patient. Please refer to After Visit Summary for other counseling recommendations.  No Follow-up on file.   Truett Mainland, DO

## 2016-05-24 NOTE — Progress Notes (Signed)
IOL February 26th @ 0730.  Pt notified.

## 2016-05-25 ENCOUNTER — Telehealth (HOSPITAL_COMMUNITY): Payer: Self-pay | Admitting: *Deleted

## 2016-05-25 LAB — HIV ANTIBODY (ROUTINE TESTING W REFLEX): HIV SCREEN 4TH GENERATION: NONREACTIVE

## 2016-05-25 NOTE — Telephone Encounter (Signed)
Preadmission screen  

## 2016-05-26 ENCOUNTER — Encounter (HOSPITAL_COMMUNITY): Payer: Self-pay

## 2016-05-26 ENCOUNTER — Encounter (HOSPITAL_COMMUNITY): Payer: Self-pay | Admitting: *Deleted

## 2016-05-26 ENCOUNTER — Inpatient Hospital Stay (HOSPITAL_COMMUNITY): Payer: Medicaid Other | Admitting: Anesthesiology

## 2016-05-26 ENCOUNTER — Inpatient Hospital Stay (HOSPITAL_COMMUNITY)
Admission: AD | Admit: 2016-05-26 | Discharge: 2016-05-28 | DRG: 775 | Disposition: A | Discharge status: Home / Self Care | Payer: Medicaid Other | Source: Ambulatory Visit | Attending: Family Medicine | Admitting: Family Medicine

## 2016-05-26 DIAGNOSIS — Z3A40 40 weeks gestation of pregnancy: Secondary | ICD-10-CM

## 2016-05-26 DIAGNOSIS — Z8249 Family history of ischemic heart disease and other diseases of the circulatory system: Secondary | ICD-10-CM | POA: Diagnosis not present

## 2016-05-26 DIAGNOSIS — Z3493 Encounter for supervision of normal pregnancy, unspecified, third trimester: Secondary | ICD-10-CM | POA: Diagnosis present

## 2016-05-26 LAB — TYPE AND SCREEN
ABO/RH(D): O POS
ANTIBODY SCREEN: NEGATIVE

## 2016-05-26 LAB — CBC
HCT: 36.2 % (ref 36.0–46.0)
Hemoglobin: 12.2 g/dL (ref 12.0–15.0)
MCH: 28.4 pg (ref 26.0–34.0)
MCHC: 33.7 g/dL (ref 30.0–36.0)
MCV: 84.2 fL (ref 78.0–100.0)
PLATELETS: 198 10*3/uL (ref 150–400)
RBC: 4.3 MIL/uL (ref 3.87–5.11)
RDW: 15.1 % (ref 11.5–15.5)
WBC: 12.4 10*3/uL — AB (ref 4.0–10.5)

## 2016-05-26 MED ORDER — ACETAMINOPHEN 325 MG PO TABS
650.0000 mg | ORAL_TABLET | ORAL | Status: DC | PRN
  Administered 2016-05-26 – 2016-05-27 (×4): 650 mg via ORAL
  Filled 2016-05-26 (×4): qty 2

## 2016-05-26 MED ORDER — FENTANYL 2.5 MCG/ML BUPIVACAINE 1/10 % EPIDURAL INFUSION (WH - ANES)
14.0000 mL/h | INTRAMUSCULAR | Status: DC | PRN
Start: 2016-05-26 — End: 2016-05-26
  Administered 2016-05-26: 14 mL/h via EPIDURAL
  Filled 2016-05-26: qty 100

## 2016-05-26 MED ORDER — ONDANSETRON HCL 4 MG/2ML IJ SOLN: 4.0000 mg | Freq: Four times a day (QID) | INTRAMUSCULAR | Status: DC | PRN

## 2016-05-26 MED ORDER — FENTANYL CITRATE (PF) 100 MCG/2ML IJ SOLN
100.0000 ug | INTRAMUSCULAR | Status: DC | PRN
  Administered 2016-05-26: 100 ug via INTRAVENOUS
  Filled 2016-05-26: qty 2

## 2016-05-26 MED ORDER — LIDOCAINE HCL (PF) 1 % IJ SOLN
INTRAMUSCULAR | Status: DC | PRN
  Administered 2016-05-26: 5 mL via EPIDURAL

## 2016-05-26 MED ORDER — DIBUCAINE 1 % RE OINT
1.0000 "application " | TOPICAL_OINTMENT | RECTAL | Status: DC | PRN
  Administered 2016-05-26: 1 via RECTAL
  Filled 2016-05-26: qty 28

## 2016-05-26 MED ORDER — TETANUS-DIPHTH-ACELL PERTUSSIS 5-2.5-18.5 LF-MCG/0.5 IM SUSP: 0.5000 mL | Freq: Once | INTRAMUSCULAR | Status: DC

## 2016-05-26 MED ORDER — PRENATAL MULTIVITAMIN CH
1.0000 | ORAL_TABLET | Freq: Every day | ORAL | Status: DC
  Administered 2016-05-26 – 2016-05-27 (×2): 1 via ORAL
  Filled 2016-05-26 (×2): qty 1

## 2016-05-26 MED ORDER — ACETAMINOPHEN 325 MG PO TABS: 650.0000 mg | ORAL_TABLET | ORAL | Status: DC | PRN

## 2016-05-26 MED ORDER — EPHEDRINE 5 MG/ML INJ: 10.0000 mg | INTRAVENOUS | Status: DC | PRN

## 2016-05-26 MED ORDER — IBUPROFEN 600 MG PO TABS
600.0000 mg | ORAL_TABLET | Freq: Four times a day (QID) | ORAL | Status: DC
  Administered 2016-05-26 – 2016-05-28 (×6): 600 mg via ORAL
  Filled 2016-05-26 (×7): qty 1

## 2016-05-26 MED ORDER — PHENYLEPHRINE 40 MCG/ML (10ML) SYRINGE FOR IV PUSH (FOR BLOOD PRESSURE SUPPORT): 80.0000 ug | PREFILLED_SYRINGE | INTRAVENOUS | Status: DC | PRN

## 2016-05-26 MED ORDER — SENNOSIDES-DOCUSATE SODIUM 8.6-50 MG PO TABS
2.0000 | ORAL_TABLET | ORAL | Status: DC
  Administered 2016-05-26 – 2016-05-28 (×2): 2 via ORAL
  Filled 2016-05-26 (×2): qty 2

## 2016-05-26 MED ORDER — LACTATED RINGERS IV SOLN: INTRAVENOUS | Status: DC

## 2016-05-26 MED ORDER — PROMETHAZINE HCL 25 MG/ML IJ SOLN
12.5000 mg | Freq: Four times a day (QID) | INTRAMUSCULAR | Status: DC | PRN
  Administered 2016-05-26: 12.5 mg via INTRAVENOUS
  Filled 2016-05-26: qty 1

## 2016-05-26 MED ORDER — OXYTOCIN BOLUS FROM INFUSION
500.0000 mL | Freq: Once | INTRAVENOUS | Status: AC
  Administered 2016-05-26: 500 mL via INTRAVENOUS

## 2016-05-26 MED ORDER — WITCH HAZEL-GLYCERIN EX PADS
1.0000 "application " | MEDICATED_PAD | CUTANEOUS | Status: DC | PRN
  Administered 2016-05-26: 1 via TOPICAL

## 2016-05-26 MED ORDER — PHENYLEPHRINE 40 MCG/ML (10ML) SYRINGE FOR IV PUSH (FOR BLOOD PRESSURE SUPPORT)
80.0000 ug | PREFILLED_SYRINGE | INTRAVENOUS | Status: DC | PRN
  Filled 2016-05-26: qty 10
  Filled 2016-05-26: qty 5

## 2016-05-26 MED ORDER — OXYCODONE HCL 5 MG PO TABS
5.0000 mg | ORAL_TABLET | ORAL | Status: DC | PRN
  Administered 2016-05-26 – 2016-05-28 (×5): 5 mg via ORAL
  Filled 2016-05-26 (×5): qty 1

## 2016-05-26 MED ORDER — MISOPROSTOL 25 MCG QUARTER TABLET
25.0000 ug | ORAL_TABLET | ORAL | Status: DC | PRN
  Filled 2016-05-26: qty 1

## 2016-05-26 MED ORDER — FENTANYL 2.5 MCG/ML BUPIVACAINE 1/10 % EPIDURAL INFUSION (WH - ANES): 14.0000 mL/h | INTRAMUSCULAR | Status: DC | PRN

## 2016-05-26 MED ORDER — LACTATED RINGERS IV SOLN: 500.0000 mL | INTRAVENOUS | Status: DC | PRN

## 2016-05-26 MED ORDER — DIPHENHYDRAMINE HCL 50 MG/ML IJ SOLN: 12.5000 mg | INTRAMUSCULAR | Status: DC | PRN

## 2016-05-26 MED ORDER — SIMETHICONE 80 MG PO CHEW: 80.0000 mg | CHEWABLE_TABLET | ORAL | Status: DC | PRN

## 2016-05-26 MED ORDER — FENTANYL CITRATE (PF) 100 MCG/2ML IJ SOLN
100.0000 ug | Freq: Once | INTRAMUSCULAR | Status: AC
  Administered 2016-05-26: 100 ug via INTRAVENOUS
  Filled 2016-05-26: qty 2

## 2016-05-26 MED ORDER — PHENYLEPHRINE 40 MCG/ML (10ML) SYRINGE FOR IV PUSH (FOR BLOOD PRESSURE SUPPORT)
80.0000 ug | PREFILLED_SYRINGE | INTRAVENOUS | Status: DC | PRN
  Filled 2016-05-26: qty 5

## 2016-05-26 MED ORDER — DIPHENHYDRAMINE HCL 25 MG PO CAPS: 25.0000 mg | ORAL_CAPSULE | Freq: Four times a day (QID) | ORAL | Status: DC | PRN

## 2016-05-26 MED ORDER — EPHEDRINE 5 MG/ML INJ
10.0000 mg | INTRAVENOUS | Status: DC | PRN
Start: 2016-05-26 — End: 2016-05-26
  Filled 2016-05-26: qty 4

## 2016-05-26 MED ORDER — EPHEDRINE 5 MG/ML INJ
10.0000 mg | INTRAVENOUS | Status: DC | PRN
  Filled 2016-05-26: qty 4

## 2016-05-26 MED ORDER — ZOLPIDEM TARTRATE 5 MG PO TABS: 5.0000 mg | ORAL_TABLET | Freq: Every evening | ORAL | Status: DC | PRN

## 2016-05-26 MED ORDER — ONDANSETRON HCL 4 MG/2ML IJ SOLN: 4.0000 mg | INTRAMUSCULAR | Status: DC | PRN

## 2016-05-26 MED ORDER — LACTATED RINGERS IV SOLN: 500.0000 mL | Freq: Once | INTRAVENOUS | Status: DC

## 2016-05-26 MED ORDER — LACTATED RINGERS IV SOLN
500.0000 mL | Freq: Once | INTRAVENOUS | Status: AC
  Administered 2016-05-26: 500 mL via INTRAVENOUS

## 2016-05-26 MED ORDER — SOD CITRATE-CITRIC ACID 500-334 MG/5ML PO SOLN: 30.0000 mL | ORAL | Status: DC | PRN

## 2016-05-26 MED ORDER — OXYTOCIN 40 UNITS IN LACTATED RINGERS INFUSION - SIMPLE MED
2.5000 [IU]/h | INTRAVENOUS | Status: DC
  Filled 2016-05-26: qty 1000

## 2016-05-26 MED ORDER — LIDOCAINE HCL (PF) 1 % IJ SOLN
30.0000 mL | INTRAMUSCULAR | Status: DC | PRN
  Filled 2016-05-26: qty 30

## 2016-05-26 MED ORDER — COCONUT OIL OIL
1.0000 "application " | TOPICAL_OIL | Status: DC | PRN
  Administered 2016-05-26: 1 via TOPICAL
  Filled 2016-05-26: qty 120

## 2016-05-26 MED ORDER — ONDANSETRON HCL 4 MG PO TABS: 4.0000 mg | ORAL_TABLET | ORAL | Status: DC | PRN

## 2016-05-26 MED ORDER — BENZOCAINE-MENTHOL 20-0.5 % EX AERO
1.0000 "application " | INHALATION_SPRAY | CUTANEOUS | Status: DC | PRN
  Administered 2016-05-26: 1 via TOPICAL
  Filled 2016-05-26: qty 56

## 2016-05-26 MED ORDER — TERBUTALINE SULFATE 1 MG/ML IJ SOLN
0.2500 mg | Freq: Once | INTRAMUSCULAR | Status: DC | PRN
  Filled 2016-05-26: qty 1

## 2016-05-26 NOTE — H&P (Signed)
LABOR AND DELIVERY ADMISSION HISTORY AND PHYSICAL NOTE  Diana Torres is a 26 y.o. female G3P1011 with IUP at [redacted]w[redacted]d by LMP presenting for SOL. She has been experiencing contractions for several weeks, but now is feeling them much stronger. She received 2 dose IV fentanyl in MAU and then showed cervical change and was admitted.    She reports positive fetal movement. She denies leakage of fluid or vaginal bleeding.  Prenatal History/Complications:  Past Medical History: Past Medical History:  Diagnosis Date  . Deaf   . History of broken nose   . 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 Mass    cyst/left upper abd quad  . Miscarriage    pt states had difficulty with high BP prior to miscarriage and afterwards till infection was healed  . PONV (postoperative nausea and vomiting)   . UTI (lower urinary tract infection)     Past Surgical History: Past Surgical History:  Procedure Laterality Date  . COCHLEAR IMPLANT    . 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 RHINOPLASTY    . right foot  2016   cyst removed  . TONSILLECTOMY      Obstetrical History: OB History    Gravida Para Term Preterm AB Living   3 1 1   1 1    SAB TAB Ectopic Multiple Live Births   1       1      Social History: Social History   Social History  . Marital status: Married    Spouse name: N/A  . Number of children: N/A  . Years of education: N/A   Social History Main Topics  . Smoking status: Never Smoker  . Smokeless tobacco: Never Used  . Alcohol use No  . Drug use: No  . Sexual activity: Not Currently   Other Topics Concern  . None   Social History Narrative  . None    Family History: Family History  Problem Relation Age of Onset  . Hearing loss Mother   . Hypertension Mother   . Alcohol abuse Father   . Hearing loss Father      Allergies: Allergies  Allergen Reactions  . Codeine Itching and Rash    Prescriptions Prior to Admission  Medication Sig Dispense Refill Last Dose  . prenatal vitamin w/FE, FA (PRENATAL 1 + 1) 27-1 MG TABS tablet Take 1 tablet by mouth daily at 12 noon.   05/26/2016 at Unknown time     Review of Systems   All systems reviewed and negative except as stated in HPI  Blood pressure 121/65, pulse 69, temperature 98.1 F (36.7 C), temperature source Oral, resp. rate 18, height 5\' 3"  (1.6 m), weight 190 lb (86.2 kg), last menstrual period 08/18/2015, SpO2 96 %, unknown if currently breastfeeding. General appearance: alert, cooperative and appears stated age Lungs: no respiratory distress, no audible wheezing Heart: regular rate and distal pulses intact Abdomen: soft, non-tender; bowel sounds normal Extremities: No calf swelling or tenderness Presentation: cephalic by nursing exam Fetal monitoring: category 1 Uterine activity: contraction 3-4 minutes Dilation: 8 Effacement (%): 100 Station: -1 Exam by:: Wladyslawa Disbro   Prenatal labs: ABO, Rh: --/--/O POS (02/21 0056) Antibody: NEG (02/21 0056) Rubella: immune RPR: Non Reactive (02/14 1133)  HBsAg: NEGATIVE (07/25 1014)  HIV: Non Reactive (02/19 1321)  GBS: Negative (01/18 0000)  Anatomy US: normal  Prenatal Transfer Tool  Maternal Diabetes: No Genetic Screening: Declined Maternal Ultrasounds/Referrals: Normal Fetal Ultrasounds or other Referrals:  None Maternal Substance Abuse:  No Significant Maternal Medications:  None Significant Maternal Lab Results: Lab values include: Group B Strep negative  Results for orders placed or performed during the hospital encounter of 05/26/16 (from the past 24 hour(s))  CBC   Collection Time: 05/26/16 12:56 AM  Result Value Ref Range   WBC 12.4 (H) 4.0 - 10.5 K/uL   RBC 4.30 3.87 - 5.11 MIL/uL   Hemoglobin 12.2 12.0 - 15.0 g/dL   HCT 36.2 36.0 - 46.0 %   MCV 84.2 78.0 - 100.0 fL    MCH 28.4 26.0 - 34.0 pg   MCHC 33.7 30.0 - 36.0 g/dL   RDW 15.1 11.5 - 15.5 %   Platelets 198 150 - 400 K/uL  Type and screen [place]   Collection Time: 05/26/16 12:56 AM  Result Value Ref Range   ABO/RH(D) O POS    Antibody Screen NEG    Sample Expiration 05/29/2016     Patient Active Problem List   Diagnosis Date Noted  . Normal labor 05/26/2016  . 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 Supervision of low-risk pregnancy 10/28/2015  . Hearing impaired 08/14/2012    Assessment: Diana Torres is a 26 y.o. G3P1011 at [redacted]w[redacted]d here for SOL  #Labor:expectant mangment, AROM at 0730 #Pain: Epidural in place #FWB: Category 1 #ID:  GBS neg #MOF: breast #MOC:BTL #Circ:  n/a  Jacquiline Doe MD 05/26/2016, 8:17 AM

## 2016-05-26 NOTE — Progress Notes (Signed)
RN tried two times to check patient and do vitals but patient had the MD in the  Room and was breastfeeding. Patient told to call out via interpreter to void. Patient told to call for fundal check . Paper work and Contractor went over. Patient told to call for help before she gets up. Patient seems to get overwhelmed with a lot of stimulation. I have let the patient tell me when she is ready for me to check her etc..

## 2016-05-26 NOTE — MAU Note (Signed)
Pt started with painful contractions at 2152 last night. Reports bloody show and a passing of mucous.  States her water has not yet broken. States her baby is moving well.

## 2016-05-26 NOTE — Lactation Note (Signed)
This note was copied from a baby's chart. Lactation Consultation Note  Patient Name: Diana Torres [id number] Date: 05/26/2016 Reason for consult: Initial assessment   Initial consult with mom of < 1 hour old infant in [place]. Spoke with parents who are both deaf with the assistance of Sarina Ser, [place] interpreter who was present in the room when I arrived. Mom reports she BF her son for a few days and felt she did not have enough milk so she switched to formula. She is planning to BF this infant for 1 year. She has asked about formula from her L & D RN and was encouraged to BF, mom agreeable.   Mom had infant latched to left breast. Assisted mom in leaning back and positioning infant at the level of the breast. Mom did report she was having some pain/pinching with feeding. Assisted mom in deepening latch and pulling infant closer to breast and she reports increasing comfort with feeding. Enc mom to latch infant in the cross cradle hold. BF basics, positioning, flanged lips, colostrum, milk coming to volume, supply and demand, and hand expression reviewed with mom. Mom with large compressible breasts and areola and everted nipples. Showed mom how to hand express on the left, no colostrum noted at this time, enc mom to hand express prior to latch. Infant fed on the left breast for about 15 minutes and then mom burped infant and relatched her to the right breast and infant actively nursing with flanged lips, rhythmic suckles and intermittent swallows when I left the room. Enc mom to massage/compress breast with feeding. Feeding log given with instructions for use.   Mom is to have a BTL tomorrow morning. Mom would like to pump later today to try and obtain milk for infant while she is away. Will notify [facility] RN to set up pump later today. Discussed with mom importance of BF first and then follow with pumping. Discussed importance of feeding infant all EBM.  BF Resources Handout and [place] Brochure  given, mom informed of IP/OP Services, BF Support Groups and [place] phone #. Mom is a [facility] client and is aware to call and make appt post d/c. Enc mom to call out to desk for feeding assistance as needed.   Maternal Data Formula Feeding for Exclusion: No Has patient been taught Hand Expression?: Yes Does the patient have breastfeeding experience prior to this delivery?: Yes  Feeding Feeding Type: Breast Fed Length of feed: 30 min  LATCH Score/Interventions Latch: Grasps breast easily, tongue down, lips flanged, rhythmical sucking.  Audible Swallowing: A few with stimulation Intervention(s): Skin to skin;Hand expression;Alternate breast massage  Type of Nipple: Everted at rest and after stimulation  Comfort (Breast/Nipple): Soft / non-tender (some pinching with latch, improved with deepening latch and positioning)     Hold (Positioning): Assistance needed to correctly position infant at breast and maintain latch. Intervention(s): Breastfeeding basics reviewed;Support Pillows;Position options;Skin to skin  LATCH Score: 8  Lactation Tools Discussed/Used WIC Program: Yes   Consult Status Consult Status: Follow-up Date: 05/27/16 Follow-up type: In-patient    Debby Freiberg Grabiel Schmutz 05/26/2016, 11:11 AM

## 2016-05-26 NOTE — Progress Notes (Signed)
Patient told how to bulb suction infant and told how to watch for bad color changes in infant. Parents aware how to use call bell.

## 2016-05-26 NOTE — Anesthesia Pain Management Evaluation Note (Signed)
  CRNA Pain Management Visit Note  Patient: Diana Torres, 26 y.o., female  "Hello I am a member of the anesthesia team at [facility]. We have an anesthesia team available at all times to provide care throughout the hospital, including epidural management and anesthesia for C-section. I don't know your plan for the delivery whether it a natural birth, water birth, IV sedation, nitrous supplementation, doula or epidural, but we want to meet your pain goals."   1.Was your pain managed to your expectations on prior hospitalizations?   Yes   2.What is your expectation for pain management during this hospitalization?     Epidural  3.How can we help you reach that goal? Maintain epidural   Record the patient's initial score and the patient's pain goal.   Pain: 2  Pain Goal: 3 [facility] wants you to be able to say your pain was always managed very well.  Cerria Randhawa 05/26/2016

## 2016-05-26 NOTE — Anesthesia Preprocedure Evaluation (Signed)
Anesthesia Evaluation  Patient identified by MRN, date of birth, ID band Patient awake    Reviewed: Allergy & Precautions, H&P , NPO status , Patient's Chart, lab work & pertinent test results  History of Anesthesia Complications (+) PONV  Airway Mallampati: II   Neck ROM: full    Dental   Pulmonary neg pulmonary ROS,    breath sounds clear to auscultation       Cardiovascular hypertension,  Rhythm:regular Rate:Normal     Neuro/Psych    GI/Hepatic   Endo/Other    Renal/GU      Musculoskeletal   Abdominal   Peds  Hematology   Anesthesia Other Findings   Reproductive/Obstetrics                             Anesthesia Physical Anesthesia Plan  ASA: II  Anesthesia Plan: Epidural   Post-op Pain Management:    Induction: Intravenous  Airway Management Planned: Natural Airway  Additional Equipment:   Intra-op Plan:   Post-operative Plan:   Informed Consent: I have reviewed the patients History and Physical, chart, labs and discussed the procedure including the risks, benefits and alternatives for the proposed anesthesia with the patient or authorized representative who has indicated his/her understanding and acceptance.     Plan Discussed with: Anesthesiologist, Surgeon and CRNA  Anesthesia Plan Comments:         Anesthesia Quick Evaluation

## 2016-05-26 NOTE — Anesthesia Procedure Notes (Signed)
Epidural Patient location during procedure: OB Start time: 05/26/2016 5:15 AM End time: 05/26/2016 5:21 AM  Staffing Anesthesiologist: Marcie Bal, Koran Seabrook Performed: anesthesiologist   Preanesthetic Checklist Completed: patient identified, site marked, pre-op evaluation, timeout performed, IV checked, risks and benefits discussed and monitors and equipment checked  Epidural Patient position: sitting Prep: DuraPrep Patient monitoring: heart rate, cardiac monitor, continuous pulse ox and blood pressure Approach: midline Location: L2-L3 Injection technique: LOR saline  Needle:  Needle type: Tuohy  Needle gauge: 17 G Needle length: 9 cm Needle insertion depth: 6 cm Catheter type: closed end flexible Catheter size: 19 Gauge Catheter at skin depth: 11 cm Test dose: negative and Other  Assessment Events: blood not aspirated, injection not painful, no injection resistance and negative IV test  Additional Notes Informed consent obtained prior to proceeding including risk of failure, 1% risk of PDPH, risk of minor discomfort and bruising.  Discussed rare but serious complications including epidural abscess, permanent nerve injury, epidural hematoma.  Discussed alternatives to epidural analgesia and patient desires to proceed.  Timeout performed pre-procedure verifying patient name, procedure, and platelet count.  Patient tolerated procedure well. Reason for block:procedure for pain

## 2016-05-26 NOTE — Anesthesia Postprocedure Evaluation (Signed)
Anesthesia Post Note  Patient: Diana Torres  Procedure(s) Performed: * No procedures listed *  Patient location during evaluation: Mother Baby Anesthesia Type: Epidural Level of consciousness: awake and alert Pain management: pain level controlled Vital Signs Assessment: post-procedure vital signs reviewed and stable Respiratory status: spontaneous breathing, nonlabored ventilation and respiratory function stable Cardiovascular status: stable Postop Assessment: no headache, no backache and epidural receding Anesthetic complications: no        Last Vitals:  Vitals:   05/26/16 1500 05/26/16 1855  BP: 132/78 127/67  Pulse: 86 87  Resp:  18  Temp:  36.8 C    Last Pain:  Vitals:   05/26/16 2041  TempSrc:   PainSc: 6    Pain Goal:                 [employer]

## 2016-05-26 NOTE — Lactation Note (Signed)
This note was copied from a baby's chart. Lactation Consultation Note  Patient Name: Diana Torres [id number] Date: 05/26/2016 Reason for consult: Initial assessment   Pump and pump kit placed in PP room for mom to begin pumping later today. Report to start mom pumping when she is ready to  Ernst Breach, Therapist, sports.    Maternal Data Formula Feeding for Exclusion: No Has patient been taught Hand Expression?: Yes Does the patient have breastfeeding experience prior to this delivery?: Yes  Feeding Feeding Type: Breast Fed Length of feed: 30 min  LATCH Score/Interventions Latch: Grasps breast easily, tongue down, lips flanged, rhythmical sucking.  Audible Swallowing: A few with stimulation Intervention(s): Skin to skin;Hand expression;Alternate breast massage  Type of Nipple: Everted at rest and after stimulation  Comfort (Breast/Nipple): Soft / non-tender (some pinching with latch, improved with deepening latch and positioning)     Hold (Positioning): Assistance needed to correctly position infant at breast and maintain latch. Intervention(s): Breastfeeding basics reviewed;Support Pillows;Position options;Skin to skin  LATCH Score: 8  Lactation Tools Discussed/Used WIC Program: Yes   Consult Status Consult Status: Follow-up Date: 05/27/16 Follow-up type: In-patient    Debby Freiberg Wren Pryce 05/26/2016, 11:31 AM

## 2016-05-27 LAB — RPR: RPR: NONREACTIVE

## 2016-05-27 NOTE — Progress Notes (Signed)
CSW received consult for "both parents hearing impaired."  This is not an appropriate reason for a CSW consult.  Please contact CSW if psychosocial issues arise or by parents' request.

## 2016-05-27 NOTE — Progress Notes (Signed)
Post Partum Day 1 Subjective: no complaints, up ad lib, voiding and tolerating PO  Objective: Blood pressure (!) 108/58, pulse 68, temperature 97.7 F (36.5 C), temperature source Oral, resp. rate 16, height 5\' 3"  (1.6 m), weight 86.2 kg (190 lb), last menstrual period 08/18/2015, SpO2 99 %, unknown if currently breastfeeding.  Physical Exam:  General: alert Lochia: appropriate Uterine Fundus: firm and NT at U-1 DVT Evaluation: No evidence of DVT seen on physical exam.   Recent Labs  05/26/16 0056  HGB 12.2  HCT 36.2    Assessment/Plan: Plan for discharge tomorrow   LOS: 1 day   Emily Filbert 05/27/2016, 8:58 AM

## 2016-05-27 NOTE — Lactation Note (Signed)
This note was copied from a baby's chart. 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 ALS interpreter at the bedside for all teaching. Mother was breastfeed infant when I arrived in room. Infant was latched but mother reports that she feels pinching.  Explained to mother that infant needed some of the areola . Infant released from breast and taught mother nipple to nose latch technique. Infant latched on with better depth. Advised mother to position infant well at the breast using firm support with pillow. Discussed cluster feeding, cue base feeding , benefit of skin to skin and to feed infant 8-12 times in 24 hours. Mother to page for concerns with breastfeeding.  Patient Name: Diana Torres [id number] Date: 05/27/2016 Reason for consult: Follow-up assessment   Maternal Data    Feeding Feeding Type: Breast Fed Length of feed: 0 min  LATCH Score/Interventions Latch: Grasps breast easily, tongue down, lips flanged, rhythmical sucking. Intervention(s): Teach feeding cues  Audible Swallowing: A few with stimulation Intervention(s): Hand expression Intervention(s): Alternate breast massage  Type of Nipple: Everted at rest and after stimulation  Comfort (Breast/Nipple): Soft / non-tender     Hold (Positioning): Assistance needed to correctly position infant at breast and maintain latch. Intervention(s): Support Pillows;Position options;Skin to skin  LATCH Score: 8  Lactation Tools Discussed/Used     Consult Status      Darla Lesches 05/27/2016, 9:24 AM

## 2016-05-27 NOTE — Progress Notes (Signed)
Interpreter was present from [place] until 1050 and was utilized by pediatrician, OB MD, lactation consultant and pt's RN.  Ronney Lion Jilliana Burkes Therapist, sports

## 2016-05-28 MED ORDER — IBUPROFEN 600 MG PO TABS: 600.0000 mg | ORAL_TABLET | Freq: Four times a day (QID) | ORAL | 0 refills | Status: DC

## 2016-05-28 NOTE — Discharge Summary (Signed)
Obstetric Discharge Summary Reason for Admission: SOL at 40 weeks Prenatal Procedures: ultrasound Intrapartum Procedures: spontaneous vaginal delivery Postpartum Procedures: none Complications-Operative and Postpartum: none Hemoglobin  Date Value Ref Range Status  05/26/2016 12.2 12.0 - 15.0 g/dL Final  03/17/2012 12.4 g/dL Final   HCT  Date Value Ref Range Status  05/26/2016 36.2 36.0 - 46.0 % Final  03/17/2012 36 % Final    Physical Exam:  General: alert Lochia: appropriate Uterine Fundus: firm and NT at U-2 DVT Evaluation: No evidence of DVT seen on physical exam.  Discharge Diagnoses: Term Pregnancy-delivered  Discharge Information: Date: 05/28/2016 Activity: pelvic rest Diet: routine Medications: PNV and Ibuprofen Condition: stable Instructions: refer to practice specific booklet Discharge to: home  She is not interested in contraception at this point. Follow-up Information    [name], MD Follow up.   Specialty:  Obstetrics and Gynecology Why:  She already has a follow up appt scheduled 06-28-16 and a BTL 07-08-16. Contact information: [place] [address] [phone]        [name], CNM. Schedule an appointment as soon as possible for a visit in 6 week(s).   Specialty:  Obstetrics and Gynecology Contact information: [place] [address] [phone]           Newborn Data: Live born female  Birth Weight: 7 lb 9.7 oz (3450 g) APGAR: 9, 9  Home with mother.  Diana Torres 05/28/2016, 7:47 AM

## 2016-05-28 NOTE — Discharge Instructions (Signed)

## 2016-05-31 ENCOUNTER — Inpatient Hospital Stay (HOSPITAL_COMMUNITY): Admission: RE | Admit: 2016-05-31 | Payer: Medicaid Other | Source: Ambulatory Visit

## 2016-05-31 ENCOUNTER — Encounter: Payer: Self-pay | Admitting: Obstetrics & Gynecology

## 2016-06-28 ENCOUNTER — Ambulatory Visit: Payer: Self-pay | Admitting: Obstetrics & Gynecology

## 2016-06-28 NOTE — Patient Instructions (Signed)
Your procedure is scheduled on:  Thursday, July 08, 2016  Enter through the [employer] of [facility] at:  11:30 AM  Pick up the phone at the desk and dial [phone].  Call this number if you have problems the morning of surgery: [phone].  Remember: Do NOT eat food:  After 5:00 AM day of surgery  Do NOT drink clear liquids after:  9:00 AM day of surgery  Take these medicines the morning of surgery with a SIP OF WATER:  None  Stop ALL herbal medications at this time  Do NOT smoke the day of surgery.  Do NOT wear jewelry (body piercing), metal hair clips/bobby pins, make-up, or nail polish. Do NOT wear lotions, powders, or perfumes.  You may wear deodorant. Do NOT shave for 48 hours prior to surgery. Do NOT bring valuables to the hospital. Contacts, dentures, or bridgework may not be worn into surgery.  Have a responsible adult drive you home and stay with you for 24 hours after your procedure  Bring a copy of your healthcare power of attorney and living will documents.  **Effective Friday, Jan. 12, 2018, [facility] will implement no hospital visitations from children age 2 and younger due to a steady increase in flu activity in our community and hospitals. **

## 2016-06-29 ENCOUNTER — Inpatient Hospital Stay (HOSPITAL_COMMUNITY)
Admission: RE | Admit: 2016-06-29 | Discharge: 2016-06-29 | Disposition: A | Discharge status: Home / Self Care | Payer: Self-pay | Source: Ambulatory Visit

## 2016-06-30 ENCOUNTER — Encounter (HOSPITAL_COMMUNITY): Payer: Self-pay

## 2016-06-30 ENCOUNTER — Inpatient Hospital Stay (HOSPITAL_COMMUNITY): Admission: RE | Admit: 2016-06-30 | Payer: Self-pay | Source: Ambulatory Visit

## 2016-06-30 ENCOUNTER — Encounter (HOSPITAL_COMMUNITY)
Admission: RE | Admit: 2016-06-30 | Discharge: 2016-06-30 | Disposition: A | Discharge status: Home / Self Care | Payer: Medicaid Other | Source: Ambulatory Visit | Attending: Obstetrics & Gynecology | Admitting: Obstetrics & Gynecology

## 2016-06-30 DIAGNOSIS — Z01812 Encounter for preprocedural laboratory examination: Secondary | ICD-10-CM | POA: Diagnosis not present

## 2016-06-30 LAB — CBC
HCT: 40.8 % (ref 36.0–46.0)
Hemoglobin: 13.3 g/dL (ref 12.0–15.0)
MCH: 28.3 pg (ref 26.0–34.0)
MCHC: 32.6 g/dL (ref 30.0–36.0)
MCV: 86.8 fL (ref 78.0–100.0)
PLATELETS: 238 10*3/uL (ref 150–400)
RBC: 4.7 MIL/uL (ref 3.87–5.11)
RDW: 14.3 % (ref 11.5–15.5)
WBC: 7.4 10*3/uL (ref 4.0–10.5)

## 2016-06-30 NOTE — Patient Instructions (Addendum)
Your procedure is scheduled on:  Thursday, July 08, 2016  Enter through the [employer] of [facility] at:  11:30 AM  Pick up the phone at the desk and dial [phone].  Call this number if you have problems the morning of surgery: [phone].  Remember: Do NOT eat food:  After 5:00 AM day of surgery  Do NOT drink clear liquids after:  9:00 AM day of surgery  Take these medicines the morning of surgery with a SIP OF WATER:  None  Stop ALL herbal medications at this time  Do NOT smoke the day of surgery.  Do NOT wear jewelry (body piercing), metal hair clips/bobby pins, make-up, or nail polish. Do NOT wear lotions, powders, or perfumes.  You may wear deodorant. Do NOT shave for 48 hours prior to surgery. Do NOT bring valuables to the hospital. Contacts, dentures, or bridgework may not be worn into surgery.  Have a responsible adult drive you home and stay with you for 24 hours after your procedure  Bring a copy of your healthcare power of attorney and living will documents.

## 2016-07-05 ENCOUNTER — Encounter: Payer: Self-pay | Admitting: Student

## 2016-07-05 ENCOUNTER — Ambulatory Visit (INDEPENDENT_AMBULATORY_CARE_PROVIDER_SITE_OTHER): Payer: Medicaid Other | Admitting: Student

## 2016-07-05 DIAGNOSIS — H9193 Unspecified hearing loss, bilateral: Secondary | ICD-10-CM

## 2016-07-05 LAB — POCT PREGNANCY, URINE: Preg Test, Ur: NEGATIVE

## 2016-07-05 NOTE — Progress Notes (Signed)
Subjective:     Diana Torres is a 26 y.o. female who presents for a postpartum visit. She is 5 weeks postpartum following a spontaneous vaginal delivery. I have fully reviewed the prenatal and intrapartum course. The delivery was at [redacted]w[redacted]d gestational weeks. Outcome: spontaneous vaginal delivery. Anesthesia: epidural. Postpartum course has been normal. Baby's course has been normal. Baby is feeding by bottle. Bleeding no bleeding. Bowel function is normal. Bladder function is normal. Patient is sexually active. Contraception method is coitus interruptus. Postpartum depression screening: negative.  The following portions of the patient's history were reviewed and updated as appropriate: allergies, current medications, past family history, past medical history, past social history, past surgical history and problem list.  Review of Systems Pertinent items are noted in HPI.   Objective:    BP 117/81   Pulse 87   Temp 98.2 F (36.8 C)   Ht 5\' 3"  (1.6 m)   Wt 176 lb 4.8 oz (80 kg)   Breastfeeding? No   BMI 31.23 kg/m   General:  alert, cooperative, appears stated age and no distress  Lungs: clear to auscultation bilaterally  Heart:  regular rate and rhythm, S1, S2 normal, no murmur, click, rub or gallop  Abdomen: soft, non-tender; bowel sounds normal; no masses,  no organomegaly        Assessment:     Normal postpartum exam. Pap smear not done at today's visit. Has negative pap 10/28/15  Plan:  1. Encounter for routine postpartum follow-up -Pt had unprotected intercourse 2 weeks ago using coitus interruptus -- UPT today negative - POCT urine pregnancy  2. Bilateral hearing loss, unspecified hearing loss type -ASL interpreter in room

## 2016-07-05 NOTE — Patient Instructions (Signed)
What You Need to Know About Female Sterilization Female sterilization is surgery to prevent pregnancy. In this surgery, the fallopian tubes are either blocked or closed off. This prevents eggs from reaching the uterus so that the eggs cannot be fertilized by sperm and you cannot get pregnant. Sterilization is permanent. It should only be done if you are sure that you do not want to be able to have children. What are the sterilization surgery options? There are several kinds of female sterilization surgeries. They include:  Laparoscopic tubal ligation. In this surgery, the fallopian tubes are tied off, sealed with heat, or blocked with a clip, ring, or clamp. A small portion of each fallopian tube may also be removed. This surgery is done through several small cuts (incisions).  Postpartum tubal ligation. This is also called a mini-laparotomy. This surgery is done right after childbirth or 1 or 2 days after childbirth. In this surgery, the fallopian tubes are tied off, sealed with heat, or blocked with a clip, ring, or clamp. A small portion of each fallopian tube may also be removed. The surgery is done through a single incision.  Hysteroscopic sterilization. In this surgery, a tiny, spring-like coil is inserted through the cervix and uterus into the fallopian tubes. The coil causes scarring, which blocks the tubes. After the surgery, contraception should be used for 3 months to allow the scar tissue to form completely. Is sterilization safe? Generally, sterilization is safe. Complications are rare. However, there are risks. They include:  Bleeding.  Infection.  Reaction to medicine used during the procedure.  Injury to surrounding organs.  Failure of the procedure. How effective is sterilization? Sterilization is nearly 100% effective, but it can fail. Also, the fallopian tubes can grow back together over time. If this happens, you will be able to get pregnant again. Women who have had this  procedure have a higher chance of having an ectopic pregnancy. An ectopic pregnancy is a pregnancy that happens outside of the uterus. This kind of pregnancy is unsuccessful and can lead to serious bleeding if it is not treated. What are the benefits?  It is usually effective for a lifetime.  It is usually safe.  It does not have the drawbacks of other types of birth control: That means:  Your hormones are not affected. Because of this, your menstrual periods, sexual desire, and sexual performance will not be affected.  There are no side effects. What are the drawbacks?  If you change your mind and decide that you want to have children, you may not be able to. Sterilization may be reversed, but a reversal is not always successful.  It does not provide protection against STDs (sexually transmitted diseases).  It increases the chance of having an ectopic pregnancy. This information is not intended to replace advice given to you by your health care provider. Make sure you discuss any questions you have with your health care provider. Document Released: 09/08/2007 Document Revised: 11/13/2015 Document Reviewed: 12/17/2014 Elsevier Interactive Patient Education  2017 [employer].

## 2016-07-06 ENCOUNTER — Ambulatory Visit: Payer: Self-pay | Admitting: Obstetrics and Gynecology

## 2016-07-08 ENCOUNTER — Ambulatory Visit (HOSPITAL_COMMUNITY): Payer: Medicaid Other | Admitting: Anesthesiology

## 2016-07-08 ENCOUNTER — Encounter (HOSPITAL_COMMUNITY): Payer: Self-pay

## 2016-07-08 ENCOUNTER — Encounter (HOSPITAL_COMMUNITY): Payer: Self-pay | Admitting: Anesthesiology

## 2016-07-08 ENCOUNTER — Encounter (HOSPITAL_COMMUNITY)
Admission: RE | Disposition: A | Discharge status: Home / Self Care | Payer: Self-pay | Source: Ambulatory Visit | Attending: Obstetrics & Gynecology

## 2016-07-08 ENCOUNTER — Ambulatory Visit (HOSPITAL_COMMUNITY)
Admission: RE | Admit: 2016-07-08 | Discharge: 2016-07-08 | Disposition: A | Discharge status: Home / Self Care | Payer: Medicaid Other | Source: Ambulatory Visit | Attending: Obstetrics & Gynecology | Admitting: Obstetrics & Gynecology

## 2016-07-08 DIAGNOSIS — H9193 Unspecified hearing loss, bilateral: Secondary | ICD-10-CM | POA: Diagnosis not present

## 2016-07-08 DIAGNOSIS — Z302 Encounter for sterilization: Secondary | ICD-10-CM | POA: Diagnosis present

## 2016-07-08 DIAGNOSIS — Z6831 Body mass index (BMI) 31.0-31.9, adult: Secondary | ICD-10-CM | POA: Diagnosis not present

## 2016-07-08 DIAGNOSIS — E669 Obesity, unspecified: Secondary | ICD-10-CM | POA: Insufficient documentation

## 2016-07-08 DIAGNOSIS — Z3491 Encounter for supervision of normal pregnancy, unspecified, first trimester: Secondary | ICD-10-CM

## 2016-07-08 DIAGNOSIS — I10 Essential (primary) hypertension: Secondary | ICD-10-CM | POA: Diagnosis not present

## 2016-07-08 HISTORY — PX: LAPAROSCOPIC TUBAL LIGATION: SHX1937

## 2016-07-08 LAB — PREGNANCY, URINE: Preg Test, Ur: NEGATIVE

## 2016-07-08 SURGERY — LIGATION, FALLOPIAN TUBE, LAPAROSCOPIC
Anesthesia: General | Laterality: Bilateral

## 2016-07-08 MED ORDER — PRENATAL PLUS 27-1 MG PO TABS: 1.0000 | ORAL_TABLET | Freq: Every day | ORAL | 2 refills | Status: DC

## 2016-07-08 MED ORDER — SUGAMMADEX SODIUM 500 MG/5ML IV SOLN
INTRAVENOUS | Status: DC | PRN
  Administered 2016-07-08: 160 mg via INTRAVENOUS

## 2016-07-08 MED ORDER — LIDOCAINE HCL (CARDIAC) 20 MG/ML IV SOLN
INTRAVENOUS | Status: DC | PRN
  Administered 2016-07-08: 90 mg via INTRAVENOUS

## 2016-07-08 MED ORDER — ONDANSETRON HCL 4 MG/2ML IJ SOLN
INTRAMUSCULAR | Status: DC | PRN
  Administered 2016-07-08: 4 mg via INTRAVENOUS

## 2016-07-08 MED ORDER — MIDAZOLAM HCL 2 MG/2ML IJ SOLN
INTRAMUSCULAR | Status: AC
  Filled 2016-07-08: qty 2

## 2016-07-08 MED ORDER — MEPERIDINE HCL 25 MG/ML IJ SOLN
INTRAMUSCULAR | Status: AC
  Administered 2016-07-08: 12.5 mg via INTRAVENOUS
  Filled 2016-07-08: qty 1

## 2016-07-08 MED ORDER — ROCURONIUM BROMIDE 100 MG/10ML IV SOLN
INTRAVENOUS | Status: AC
  Filled 2016-07-08: qty 1

## 2016-07-08 MED ORDER — PROPOFOL 10 MG/ML IV BOLUS
INTRAVENOUS | Status: AC
  Filled 2016-07-08: qty 20

## 2016-07-08 MED ORDER — FENTANYL CITRATE (PF) 100 MCG/2ML IJ SOLN
INTRAMUSCULAR | Status: AC
  Filled 2016-07-08: qty 2

## 2016-07-08 MED ORDER — DOCUSATE SODIUM 100 MG PO CAPS: 100.0000 mg | ORAL_CAPSULE | Freq: Two times a day (BID) | ORAL | 2 refills | Status: DC | PRN

## 2016-07-08 MED ORDER — SCOPOLAMINE 1 MG/3DAYS TD PT72
MEDICATED_PATCH | TRANSDERMAL | Status: AC
  Administered 2016-07-08: 1.5 mg via TRANSDERMAL
  Filled 2016-07-08: qty 1

## 2016-07-08 MED ORDER — IBUPROFEN 600 MG PO TABS: 600.0000 mg | ORAL_TABLET | Freq: Four times a day (QID) | ORAL | 2 refills | Status: DC

## 2016-07-08 MED ORDER — 0.9 % SODIUM CHLORIDE (POUR BTL) OPTIME
TOPICAL | Status: DC | PRN
  Administered 2016-07-08: 1000 mL

## 2016-07-08 MED ORDER — MEPERIDINE HCL 25 MG/ML IJ SOLN
6.2500 mg | INTRAMUSCULAR | Status: DC | PRN
  Administered 2016-07-08 (×2): 12.5 mg via INTRAVENOUS

## 2016-07-08 MED ORDER — ROCURONIUM BROMIDE 100 MG/10ML IV SOLN
INTRAVENOUS | Status: DC | PRN
  Administered 2016-07-08: 30 mg via INTRAVENOUS

## 2016-07-08 MED ORDER — METOCLOPRAMIDE HCL 5 MG/ML IJ SOLN
INTRAMUSCULAR | Status: AC
  Administered 2016-07-08: 10 mg via INTRAVENOUS
  Filled 2016-07-08: qty 2

## 2016-07-08 MED ORDER — BUPIVACAINE HCL (PF) 0.5 % IJ SOLN
INTRAMUSCULAR | Status: DC | PRN
  Administered 2016-07-08: 30 mL

## 2016-07-08 MED ORDER — HYDROCODONE-ACETAMINOPHEN 7.5-325 MG PO TABS: 1.0000 | ORAL_TABLET | Freq: Once | ORAL | Status: DC | PRN

## 2016-07-08 MED ORDER — MIDAZOLAM HCL 2 MG/2ML IJ SOLN
INTRAMUSCULAR | Status: DC | PRN
Start: 2016-07-08 — End: 2016-07-08
  Administered 2016-07-08: 2 mg via INTRAVENOUS

## 2016-07-08 MED ORDER — PROPOFOL 10 MG/ML IV BOLUS
INTRAVENOUS | Status: DC | PRN
  Administered 2016-07-08: 150 mg via INTRAVENOUS

## 2016-07-08 MED ORDER — OXYCODONE-ACETAMINOPHEN 5-325 MG PO TABS
ORAL_TABLET | ORAL | Status: AC
  Filled 2016-07-08: qty 1

## 2016-07-08 MED ORDER — KETOROLAC TROMETHAMINE 30 MG/ML IJ SOLN
INTRAMUSCULAR | Status: DC | PRN
  Administered 2016-07-08: 30 mg via INTRAVENOUS

## 2016-07-08 MED ORDER — DEXAMETHASONE SODIUM PHOSPHATE 10 MG/ML IJ SOLN
INTRAMUSCULAR | Status: DC | PRN
  Administered 2016-07-08: 4 mg via INTRAVENOUS

## 2016-07-08 MED ORDER — DEXAMETHASONE SODIUM PHOSPHATE 4 MG/ML IJ SOLN
INTRAMUSCULAR | Status: AC
  Filled 2016-07-08: qty 1

## 2016-07-08 MED ORDER — FENTANYL CITRATE (PF) 100 MCG/2ML IJ SOLN
INTRAMUSCULAR | Status: DC | PRN
  Administered 2016-07-08 (×2): 50 ug via INTRAVENOUS
  Administered 2016-07-08: 100 ug via INTRAVENOUS

## 2016-07-08 MED ORDER — OXYCODONE-ACETAMINOPHEN 5-325 MG PO TABS: 1.0000 | ORAL_TABLET | Freq: Four times a day (QID) | ORAL | 0 refills | Status: DC | PRN

## 2016-07-08 MED ORDER — LACTATED RINGERS IV SOLN
INTRAVENOUS | Status: DC
  Administered 2016-07-08: 125 mL/h via INTRAVENOUS

## 2016-07-08 MED ORDER — HYDROMORPHONE HCL 1 MG/ML IJ SOLN
INTRAMUSCULAR | Status: AC
  Administered 2016-07-08: 0.5 mg via INTRAVENOUS
  Filled 2016-07-08: qty 1

## 2016-07-08 MED ORDER — BUPIVACAINE HCL (PF) 0.5 % IJ SOLN
INTRAMUSCULAR | Status: AC
  Filled 2016-07-08: qty 30

## 2016-07-08 MED ORDER — SCOPOLAMINE 1 MG/3DAYS TD PT72
1.0000 | MEDICATED_PATCH | Freq: Once | TRANSDERMAL | Status: DC
  Administered 2016-07-08: 1.5 mg via TRANSDERMAL

## 2016-07-08 MED ORDER — METOCLOPRAMIDE HCL 5 MG/ML IJ SOLN
10.0000 mg | Freq: Once | INTRAMUSCULAR | Status: AC | PRN
  Administered 2016-07-08: 10 mg via INTRAVENOUS

## 2016-07-08 MED ORDER — HYDROMORPHONE HCL 1 MG/ML IJ SOLN
0.2500 mg | INTRAMUSCULAR | Status: DC | PRN
  Administered 2016-07-08: 0.5 mg via INTRAVENOUS

## 2016-07-08 MED ORDER — LIDOCAINE HCL (CARDIAC) 20 MG/ML IV SOLN
INTRAVENOUS | Status: AC
Start: 2016-07-08 — End: 2016-07-08
  Filled 2016-07-08: qty 5

## 2016-07-08 MED ORDER — ONDANSETRON HCL 4 MG/2ML IJ SOLN
INTRAMUSCULAR | Status: AC
  Filled 2016-07-08: qty 2

## 2016-07-08 MED ORDER — KETOROLAC TROMETHAMINE 30 MG/ML IJ SOLN
INTRAMUSCULAR | Status: AC
  Filled 2016-07-08: qty 1

## 2016-07-08 SURGICAL SUPPLY — 24 items
CATH ROBINSON RED A/P 16FR (CATHETERS) ×3 IMPLANT
CLIP FILSHIE TUBAL LIGA STRL (Clip) ×6 IMPLANT
CLOTH BEACON ORANGE TIMEOUT ST (SAFETY) ×3 IMPLANT
DERMABOND ADVANCED (GAUZE/BANDAGES/DRESSINGS) ×2
DERMABOND ADVANCED .7 DNX12 (GAUZE/BANDAGES/DRESSINGS) ×1 IMPLANT
DRSG OPSITE POSTOP 3X4 (GAUZE/BANDAGES/DRESSINGS) ×3 IMPLANT
DURAPREP 26ML APPLICATOR (WOUND CARE) ×3 IMPLANT
GLOVE BIOGEL PI IND STRL 7.0 (GLOVE) ×4 IMPLANT
GLOVE BIOGEL PI INDICATOR 7.0 (GLOVE) ×8
GLOVE ECLIPSE 7.0 STRL STRAW (GLOVE) ×3 IMPLANT
GOWN STRL REUS W/TWL LRG LVL3 (GOWN DISPOSABLE) ×6 IMPLANT
NEEDLE HYPO 23GX1 LL BLUE HUB (NEEDLE) ×3 IMPLANT
NEEDLE INSUFFLATION 120MM (ENDOMECHANICALS) IMPLANT
PACK LAPAROSCOPY BASIN (CUSTOM PROCEDURE TRAY) ×3 IMPLANT
PACK TRENDGUARD 450 HYBRID PRO (MISCELLANEOUS) IMPLANT
PACK TRENDGUARD 600 HYBRD PROC (MISCELLANEOUS) IMPLANT
PROTECTOR NERVE ULNAR (MISCELLANEOUS) ×6 IMPLANT
SUT VIC AB 3-0 X1 27 (SUTURE) ×3 IMPLANT
SUT VICRYL 0 UR6 27IN ABS (SUTURE) ×3 IMPLANT
SYR 30ML LL (SYRINGE) ×3 IMPLANT
TOWEL OR 17X24 6PK STRL BLUE (TOWEL DISPOSABLE) ×6 IMPLANT
TRENDGUARD 450 HYBRID PRO PACK (MISCELLANEOUS)
TRENDGUARD 600 HYBRID PROC PK (MISCELLANEOUS)
TROCAR XCEL NON-BLD 11X100MML (ENDOMECHANICALS) ×3 IMPLANT

## 2016-07-08 NOTE — Anesthesia Procedure Notes (Signed)
Performed by: Davonda Ausley M       

## 2016-07-08 NOTE — Anesthesia Preprocedure Evaluation (Signed)
Anesthesia Evaluation  Patient identified by MRN, date of birth, ID band Patient awake    Reviewed: Allergy & Precautions, H&P , NPO status , Patient's Chart, lab work & pertinent test results  History of Anesthesia Complications (+) PONV and history of anesthetic complications  Airway Mallampati: II   Neck ROM: full    Dental no notable dental hx. (+) Teeth Intact   Pulmonary neg pulmonary ROS,    Pulmonary exam normal breath sounds clear to auscultation       Cardiovascular hypertension, Pt. on medications Normal cardiovascular exam Rhythm:regular Rate:Normal     Neuro/Psych Bilateral deafness negative psych ROS   GI/Hepatic negative GI ROS, Neg liver ROS,   Endo/Other  Obesity  Renal/GU negative Renal ROS  negative genitourinary   Musculoskeletal negative musculoskeletal ROS (+)   Abdominal (+) + obese,   Peds  Hematology negative hematology ROS (+)   Anesthesia Other Findings   Reproductive/Obstetrics Undesired fertility                             Anesthesia Physical  Anesthesia Plan  ASA: II  Anesthesia Plan: General   Post-op Pain Management:    Induction: Intravenous  Airway Management Planned: Oral ETT  Additional Equipment:   Intra-op Plan:   Post-operative Plan: Extubation in OR  Informed Consent: I have reviewed the patients History and Physical, chart, labs and discussed the procedure including the risks, benefits and alternatives for the proposed anesthesia with the patient or authorized representative who has indicated his/her understanding and acceptance.   Dental advisory given  Plan Discussed with: Anesthesiologist, Surgeon and CRNA  Anesthesia Plan Comments:         Anesthesia Quick Evaluation

## 2016-07-08 NOTE — Op Note (Signed)
Diana Torres [birth year]  PREOPERATIVE DIAGNOSIS:  Undesired fertility  POSTOPERATIVE DIAGNOSIS:  Undesired fertility  PROCEDURE:  Laparoscopic Bilateral Tubal Sterilization using Filshie Clips   SURGEON: [name], MD  ANESTHESIA:  General endotracheal  COMPLICATIONS:  None immediate.  ESTIMATED BLOOD LOSS:  5 ml.  FLUIDS: 1000 ml LR.  URINE OUTPUT:  20 ml of clear urine.  INDICATIONS: 26 y.o. [id number]  with undesired fertility, desires permanent sterilization. Other reversible forms of contraception were discussed with patient; she declines all other modalities.  Risks of procedure discussed with patient including permanence of method, bleeding, infection, injury to surrounding organs and need for additional procedures including laparotomy, risk of regret.  Failure risk of 0.5-1% with increased risk of ectopic gestation if pregnancy occurs was also discussed with patient.      FINDINGS:  Normal uterus, tubes, and ovaries.  TECHNIQUE:  The patient was taken to the operating room where general anesthesia was obtained without difficulty.  She was then placed in the dorsal lithotomy position and prepared and draped in sterile fashion.  After an adequate timeout was performed, a bivalved speculum was then placed in the patient's vagina, and the anterior lip of cervix grasped with the single-tooth tenaculum.  The uterine manipulator was then advanced into the uterus.  The speculum was removed from the vagina.  Attention was then turned to the patient's abdomen where a 11-mm skin incision was made in the umbilical fold.  The Optiview 11-mm trocar and sleeve were then advanced without difficulty with the laparoscope under direct visualization into the abdomen.  The abdomen was then insufflated with carbon dioxide gas and adequate pneumoperitoneum was obtained.  A survey of the patient's pelvis and abdomen revealed entirely normal anatomy.  The fallopian tubes were observed and found to be  normal in appearance. The Filshie clip applicator was placed through the operative port, and a Filshie clip was placed on the right fallopian tube, about 3 cm from the cornual attachment, with care given to incorporate the underlying mesosalpinx.  A similar process was carried out on the contralateral side allowing for bilateral tubal sterilization.   Good hemostasis was noted overall.  Local analgesia was drizzled on both operative sites.The instruments were then removed from the patient's abdomen and the fascial incision was repaired with 0 Vicryl, and the skin was closed with Dermabond.  The uterine manipulator and the tenaculum were removed from the vagina without complications. The patient tolerated the procedure well.  Sponge, lap, and needle counts were correct times two.  The patient was then taken to the recovery room awake, extubated and in stable condition.  The patient will be discharged to home as per PACU criteria.  Routine postoperative instructions given.  She was prescribed Percocet, Ibuprofen and Colace.  She will follow up in the clinic on 07/30/2016 for postoperative evaluation.   [name], MD, [facility] Attending [place], [facility] for [employer], [place]

## 2016-07-08 NOTE — Anesthesia Procedure Notes (Signed)
Procedure Name: Intubation Date/Time: 07/08/2016 1:12 PM Performed by: [employer], Sheron Nightingale Pre-anesthesia Checklist: Patient identified, Emergency Drugs available, Patient being monitored, Suction available and Timeout performed Patient Re-evaluated:Patient Re-evaluated prior to inductionPreoxygenation: Pre-oxygenation with 100% oxygen Intubation Type: IV induction Ventilation: Mask ventilation without difficulty Laryngoscope Size: Mac and 3 Grade View: Grade I Tube type: Oral Tube size: 7.0 mm Number of attempts: 1 Airway Equipment and Method: Stylet Placement Confirmation: ETT inserted through vocal cords under direct vision,  positive ETCO2 and breath sounds checked- equal and bilateral Secured at: 20 cm Tube secured with: Tape

## 2016-07-08 NOTE — Anesthesia Procedure Notes (Signed)
Procedure Name: Intubation Date/Time: 07/08/2016 1:12 PM Performed by: Damyn Weitzel, Sheron Nightingale Pre-anesthesia Checklist: Patient identified, Emergency Drugs available, Suction available, Patient being monitored and Timeout performed Patient Re-evaluated:Patient Re-evaluated prior to inductionOxygen Delivery Method: Circle system utilized Intubation Type: IV induction Ventilation: Mask ventilation without difficulty Laryngoscope Size: Mac and 3 Grade View: Grade I Number of attempts: 1 Placement Confirmation: ETT inserted through vocal cords under direct vision Secured at: 20 cm Dental Injury: Teeth and Oropharynx as per pre-operative assessment

## 2016-07-08 NOTE — Anesthesia Postprocedure Evaluation (Signed)
Anesthesia Post Note  Patient: Diana Torres  Procedure(s) Performed: Procedure(s) (LRB): LAPAROSCOPIC TUBAL LIGATION WITH FILSHIE CLIPS (Bilateral)  Patient location during evaluation: PACU Anesthesia Type: General Level of consciousness: awake and alert and oriented Pain management: pain level controlled Vital Signs Assessment: post-procedure vital signs reviewed and stable Respiratory status: spontaneous breathing, nonlabored ventilation and respiratory function stable Cardiovascular status: blood pressure returned to baseline and stable Postop Assessment: no signs of nausea or vomiting Anesthetic complications: no        Last Vitals:  Vitals:   07/08/16 1515 07/08/16 1530  BP: 121/86 125/76  Pulse: 70 82  Resp: 13 18  Temp:      Last Pain:  Vitals:   07/08/16 1515  TempSrc:   PainSc: 2    Pain Goal: Patients Stated Pain Goal: 3 (07/08/16 1151)               Kaydra Borgen A.

## 2016-07-08 NOTE — Transfer of Care (Signed)
2Immediate Anesthesia Transfer of Care Note  Patient: Diana Torres  Procedure(s) Performed: Procedure(s): LAPAROSCOPIC TUBAL LIGATION WITH FILSHIE CLIPS (Bilateral)  Patient Location: PACU  Anesthesia Type:General  Level of Consciousness: awake, alert  and oriented  Airway & Oxygen Therapy: Patient Spontanous Breathing  Post-op Assessment: Report given to RN and Post -op Vital signs reviewed and stable  Post vital signs: Reviewed and stable  Last Vitals:  Vitals:   07/08/16 1151  BP: 110/79  Pulse: 85  Resp: 16  Temp: 36.7 C    Last Pain:  Vitals:   07/08/16 1151  TempSrc: Oral      Patients Stated Pain Goal: 3 (60/16/58 0063)  Complications: No apparent anesthesia complications

## 2016-07-08 NOTE — Discharge Instructions (Signed)
Laparoscopic Surgery - Care After Laparoscopy is a surgical procedure. It is used to diagnose and treat diseases inside the belly(abdomen). It is usually a brief, common, and relatively simple procedure. The laparoscopeis a thin, lighted, pencil-sized instrument. It is like a telescope. It is inserted into your abdomen through a small cut (incision). Your caregiver can look at the organs inside your body through this instrument.  She can see if there is anything abnormal. Laparoscopy can be done either in a hospital or outpatient clinic. You may be given a mild sedative to help you relax before the procedure. Once in the operating room, you will be given a drug to make you sleep (general anesthesia). Laparoscopy usually lasts about 1 hour. After the procedure, you will be monitored in a recovery area until you are stable and doing well. Once you are home, it may take 3 to 7 days to fully recover.  Laparoscopy has relatively few risks. Your caregiver will discuss the risks with you before the procedure. Some problems that can occur include: RISKS AND COMPLICATIONS  Allergies to medicines. Difficulty breathing. Bleeding. Infection. Damage to other surrounding structures HOME CARE INSTRUCTIONS  Infection. Bleeding. Damage to other organs. Anesthetic side effects.  Need for additional procedures such as open procedures/laparotomy PROCEDURE Once you receive anesthesia, your surgeon inflates the abdomen with a harmless gas (carbon dioxide). This makes the organs easier to see. The laparoscope is inserted into the abdomen through a small incision. This allows your surgeon to see into the abdomen. Other small instruments are also inserted into the abdomen through other small openings. Many surgeons attach a video camera to the laparoscope to enlarge the view. During a laparoscopy, the surgeon may be looking for inflammation, infection, or cancer.  The surgeon may also need to take out certain organs or  take tissue samples (biopsies). The specimens are sent to a specialist in looking at cells and tissue samples (pathologist). The pathologist examines them under a microscope to help to diagnose or confirm a disease. AFTER THE PROCEDURE  The incisions are closed with stitches (sutures) and Dermabond. Because these incisions are small (usually less than 1/2 inch), there is usually minimal discomfort after the procedure. There may also be discomfort from the instrument placement incisions in the abdomen. You will be given pain medicine to ease any discomfort. You will rest in a recovery room for 1-2 hours until you are stable and doing well. You may have some mild discomfort in the throat. This is from the tube placed in your throat while you were sleeping. You may experience discomfort in the shoulder area from some trapped air between the liver and diaphragm. This sensation is normal and will slowly go away on its own. The recovery time is shortened as long as there are no complications. You will rest in a recovery room until stable and doing well. As long as there are no complications, you may be allowed to go home. Someone will need to drive you home and be with you for at least 24 hours once home. FINDING OUT THE RESULTS You will be called with the results of the pathology and will discuss these results with  your caregiver during your postoperative appointment. Do not assume everything is normal if you have not heard from your caregiver or the medical facility. It is important for you to follow up on all of your results. HOME CARE INSTRUCTIONS  Take all medicines as directed. Only take over-the-counter or prescription medicines for pain, discomfort,   CARE INSTRUCTIONS   Take all medicines as directed.  Only take over-the-counter or prescription medicines for pain, discomfort, or fever as directed by your caregiver.  Resume daily activities as directed.  Showers are preferred over baths.  You may resume sexual activities in 1 week or as directed.  Do not drive while taking  narcotics. SEEK MEDICAL CARE IF:  There is increasing abdominal pain.  You feel lightheaded or faint.  You have the chills.  You have an oral temperature above 102 F (38.9 C).  There is pus-like (purulent) drainage from any of the wounds.  You are unable to pass gas or have a bowel movement.  You feel sick to your stomach (nauseous) or throw up (vomit). MAKE SURE YOU:   Understand these instructions.  Will watch your condition.  Will get help right away if you are not doing well or get worse.  General Anesthesia, Adult, Care After These instructions provide you with information about caring for yourself after your procedure. Your health care provider may also give you more specific instructions. Your treatment has been planned according to current medical practices, but problems sometimes occur. Call your health care provider if you have any problems or questions after your procedure. What can I expect after the procedure? After the procedure, it is common to have:  Vomiting.  A sore throat.  Mental slowness. It is common to feel:  Nauseous.  Cold or shivery.  Sleepy.  Tired.  Sore or achy, even in parts of your body where you did not have surgery. Follow these instructions at home: For at least 24 hours after the procedure:   Do not:  Participate in activities where you could fall or become injured.  Drive.  Use heavy machinery.  Drink alcohol.  Take sleeping pills or medicines that cause drowsiness.  Make important decisions or sign legal documents.  Take care of children on your own.  Rest. Eating and drinking   If you vomit, drink water, juice, or soup when you can drink without vomiting.  Drink enough fluid to keep your urine clear or pale yellow.  Make sure you have little or no nausea before eating solid foods.  Follow the diet recommended by your health care provider. General instructions   Have a responsible adult stay with you  until you are awake and alert.  Return to your normal activities as told by your health care provider. Ask your health care provider what activities are safe for you.  Take over-the-counter and prescription medicines only as told by your health care provider.  If you smoke, do not smoke without supervision.  Keep all follow-up visits as told by your health care provider. This is important. Contact a health care provider if:  You continue to have nausea or vomiting at home, and medicines are not helpful.  You cannot drink fluids or start eating again.  You cannot urinate after 8-12 hours.  You develop a skin rash.  You have fever.  You have increasing redness at the site of your procedure. Get help right away if:  You have difficulty breathing.  You have chest pain.  You have unexpected bleeding.  You feel that you are having a life-threatening or urgent problem. This information is not intended to replace advice given to you by your health care provider. Make sure you discuss any questions you have with your health care provider. Document Released: 06/28/2000 Document Revised: 08/25/2015 Document Reviewed: 03/06/2015 Elsevier Interactive Patient Education  2017 [employer]

## 2016-07-08 NOTE — H&P (Signed)
Preoperative History and Physical  Diana Torres is a 26 y.o. [id number] here for surgical management of undesired fertility; she desires permanent sterilization.   No significant preoperative concerns. Patient is hearing impaired, ASL intepreter used for this encounter.   Proposed surgery: Laparoscopic bilateral tubal sterilization using Filshie clips.  Past Medical History:  Diagnosis Date  . Deaf   . History of broken nose   . 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 Mass    cyst/left upper abd quad  . Miscarriage    pt states had difficulty with high BP prior to miscarriage and afterwards till infection was healed  . PONV (postoperative nausea and vomiting)   . UTI (lower urinary tract infection)    Past Surgical History:  Procedure Laterality Date  . COCHLEAR IMPLANT    . 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 RHINOPLASTY    . right foot  2016   cyst removed  . TONSILLECTOMY     OB History  Gravida Para Term Preterm AB Living  3 2 2   1 2   SAB TAB Ectopic Multiple Live Births  1     0 2    # Outcome Date GA Lbr Len/2nd Weight Sex Delivery Anes PTL Lv  3 Term 05/26/16 [redacted]w[redacted]d / 00:24 7 lb 9.7 oz (3.45 kg) F Vag-Spont EPI  LIV  2 SAB 02/18/15 [redacted]w[redacted]d         1 Term 09/23/12 [redacted]w[redacted]d 15:04 / 02:15 7 lb 6 oz (3.345 kg) M Vag-Spont EPI  LIV    Patient denies any other pertinent gynecologic issues.   No current facility-administered medications on file prior to encounter.    Current Outpatient Prescriptions on File Prior to Encounter  Medication Sig Dispense Refill  . ibuprofen (ADVIL,MOTRIN) 600 MG tablet Take 1 tablet (600 mg total) by mouth every 6 (six) hours. (Patient not taking: Reported on 06/24/2016) 30 tablet 0  . prenatal vitamin w/FE, FA (PRENATAL 1 + 1) 27-1 MG TABS tablet Take 1 tablet by mouth daily before  breakfast.      Allergies  Allergen Reactions  . Codeine Itching and Rash    Social History:   reports that she has never smoked. She has never used smokeless tobacco. She reports that she does not drink alcohol or use drugs.  Family History  Problem Relation Age of Onset  . Hearing loss Mother   . Hypertension Mother   . Alcohol abuse Father   . Hearing loss Father     Review of Systems: Noncontributory  PHYSICAL EXAM: Blood pressure 110/79, pulse 85, temperature 98 F (36.7 C), temperature source Oral, resp. rate 16, SpO2 98 %, not currently breastfeeding. CONSTITUTIONAL: Well-developed, well-nourished female in no acute distress.  HENT:  Normocephalic, atraumatic, External right and left ear normal. Oropharynx is clear and moist EYES: Conjunctivae and EOM are normal. Pupils are equal, round, and reactive to light. No scleral icterus.  NECK: Normal range of motion, supple, no masses SKIN: Skin is warm and dry. No rash noted. Not diaphoretic. No erythema. No pallor. NEUROLOGIC: Alert and oriented to person, place, and time. Normal reflexes, muscle tone coordination. No cranial nerve deficit noted. PSYCHIATRIC: Normal mood and affect. Normal behavior. Normal judgment and thought content. CARDIOVASCULAR: Normal heart rate noted, regular rhythm RESPIRATORY: Effort and breath sounds normal, no problems with respiration noted  ABDOMEN: Soft, nontender, nondistended. PELVIC: Deferred MUSCULOSKELETAL: Normal range of motion. No edema and no tenderness. 2+ distal pulses.  Labs: Results for orders placed or performed during the hospital encounter of 07/08/16 (from the past 336 hour(s))  Pregnancy, urine   Collection Time: 07/08/16 11:30 AM  Result Value Ref Range   Preg Test, Ur NEGATIVE NEGATIVE  Results for orders placed or performed in visit on 07/05/16 (from the past 336 hour(s))  Pregnancy, urine POC   Collection Time: 07/05/16 10:52 AM  Result Value Ref Range   Preg Test, Ur  NEGATIVE NEGATIVE  Results for orders placed or performed during the hospital encounter of 06/30/16 (from the past 336 hour(s))  CBC   Collection Time: 06/30/16 10:20 AM  Result Value Ref Range   WBC 7.4 4.0 - 10.5 K/uL   RBC 4.70 3.87 - 5.11 MIL/uL   Hemoglobin 13.3 12.0 - 15.0 g/dL   HCT 40.8 36.0 - 46.0 %   MCV 86.8 78.0 - 100.0 fL   MCH 28.3 26.0 - 34.0 pg   MCHC 32.6 30.0 - 36.0 g/dL   RDW 14.3 11.5 - 15.5 %   Platelets 238 150 - 400 K/uL    Imaging Studies: No results found.  Assessment: Patient Active Problem List   Diagnosis Date Noted  . Encounter for sterilization 07/08/2016  . Hearing impaired 08/14/2012    Plan: Patient will undergo surgical management with laparoscopic bilateral tubal sterilization using Filshie clips. Other reversible forms of contraception were discussed with patient; she declines all other modalities. Risks of procedure discussed with patient including but not limited to: risk of regret, permanence of method, bleeding, infection, injury to surrounding organs and need for additional procedures including laparoscopy or laparotomy; thromboembolic phenomenon, surgical site problems and other postoperative/anesthesia complications.  Failure risk of 1-2 % with increased risk of ectopic gestation if pregnancy occurs was also discussed with patient. Routine postoperative instructions will be reviewed with the patient and her family in detail after surgery. Patient verbalized understanding of these risks and wants to proceed with sterilization.  Written informed consent obtained.  To OR when ready.  [name], M.D. 07/08/2016 12:16 PM

## 2016-07-09 ENCOUNTER — Encounter (HOSPITAL_COMMUNITY): Payer: Self-pay | Admitting: Obstetrics & Gynecology

## 2016-07-30 ENCOUNTER — Ambulatory Visit: Payer: Self-pay | Admitting: Obstetrics & Gynecology

## 2016-08-24 ENCOUNTER — Other Ambulatory Visit: Payer: Self-pay | Admitting: General Surgery

## 2016-08-24 DIAGNOSIS — R1013 Epigastric pain: Secondary | ICD-10-CM

## 2016-08-26 ENCOUNTER — Ambulatory Visit
Admission: RE | Admit: 2016-08-26 | Discharge: 2016-08-26 | Disposition: A | Discharge status: Home / Self Care | Payer: Medicaid Other | Source: Ambulatory Visit | Attending: General Surgery | Admitting: General Surgery

## 2016-08-26 DIAGNOSIS — R1013 Epigastric pain: Secondary | ICD-10-CM

## 2016-08-26 MED ORDER — IOPAMIDOL (ISOVUE-300) INJECTION 61%
100.0000 mL | Freq: Once | INTRAVENOUS | Status: AC | PRN
  Administered 2016-08-26: 100 mL via INTRAVENOUS

## 2016-08-27 NOTE — Progress Notes (Signed)
Please let patient or her mother know that labs and CT are normal.  No evidence of tumor or pancreatitis.  Will need GI referral.  See if they have a preference for which practice.  tx FB

## 2016-08-31 ENCOUNTER — Encounter: Payer: Self-pay | Admitting: Gastroenterology

## 2016-09-28 ENCOUNTER — Ambulatory Visit: Payer: Self-pay | Admitting: Gastroenterology

## 2016-09-28 ENCOUNTER — Other Ambulatory Visit: Payer: Self-pay

## 2016-11-20 DIAGNOSIS — H11439 Conjunctival hyperemia, unspecified eye: Secondary | ICD-10-CM | POA: Diagnosis not present

## 2016-11-20 DIAGNOSIS — H019 Unspecified inflammation of eyelid: Secondary | ICD-10-CM | POA: Diagnosis not present

## 2016-11-20 DIAGNOSIS — L298 Other pruritus: Secondary | ICD-10-CM | POA: Diagnosis not present

## 2016-12-27 IMAGING — CT CT ABD-PELV W/ CM
2 of 4 series · 16 of 46 positions shown, 18 images · IV contrast (OMNIPAQUE 300)
Comparison: None.

CLINICAL DATA: Abdominal pain. History of recent spontaneous
abortion. This is the third visit to [facility] with this type of pain.
Pain has been present since [REDACTED]. Occasional nausea. Pain
persists despite treatment.

EXAM:
CT ABDOMEN AND PELVIS WITH CONTRAST
TECHNIQUE: Multidetector CT imaging of the abdomen and pelvis was performed
using the standard protocol following bolus administration of
intravenous contrast.
CONTRAST:  25mL OMNIPAQUE IOHEXOL 300 MG/ML SOLN, 100mL OMNIPAQUE
IOHEXOL 300 MG/ML SOLN

[Series 2: abd/pel with · axial · 0.71mm/px · z∈[+908,+1303]mm · 13 of 90 slices shown, 15 images]
[im 7/90  soft-tissue]
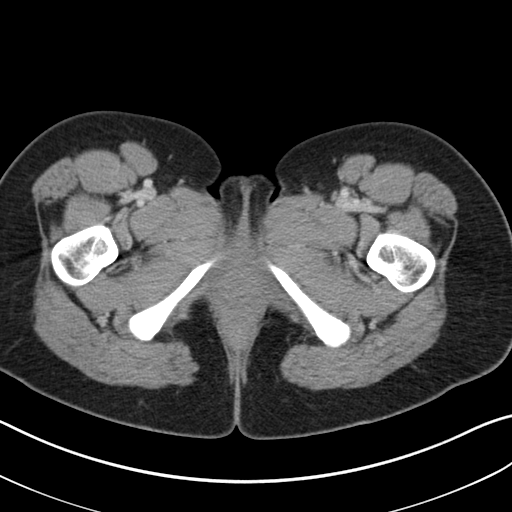
[im 7/90  bone]
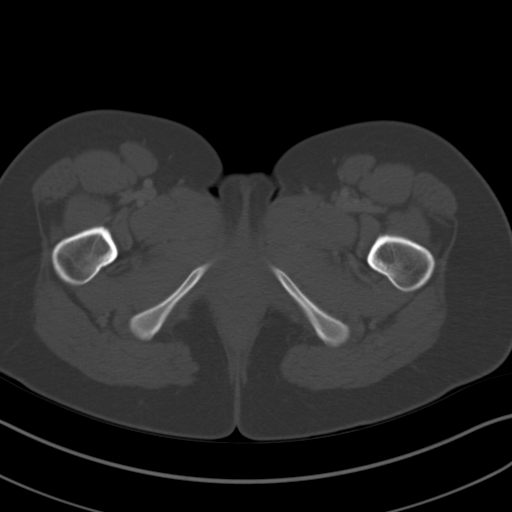
[im 14/90  soft-tissue]
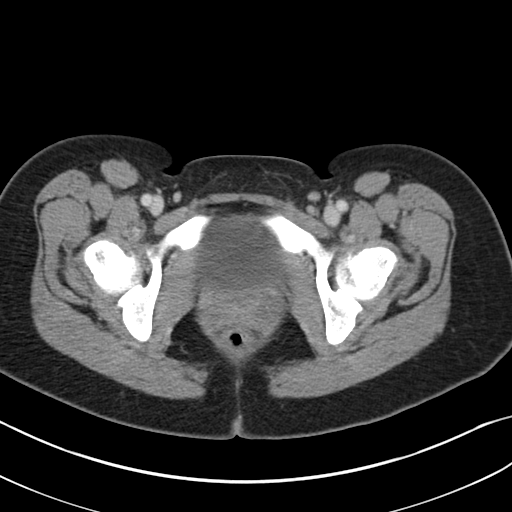
[im 20/90  soft-tissue]
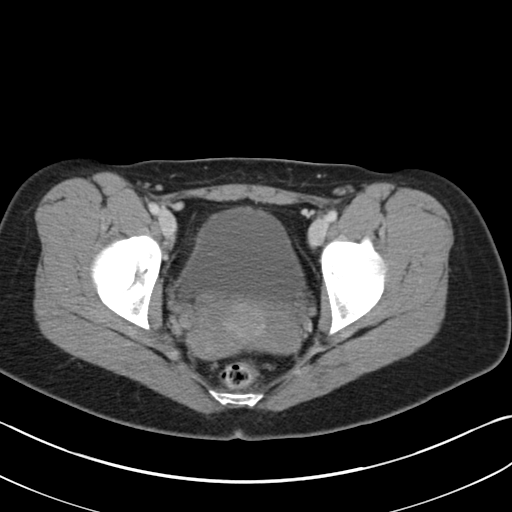
[im 27/90  soft-tissue]
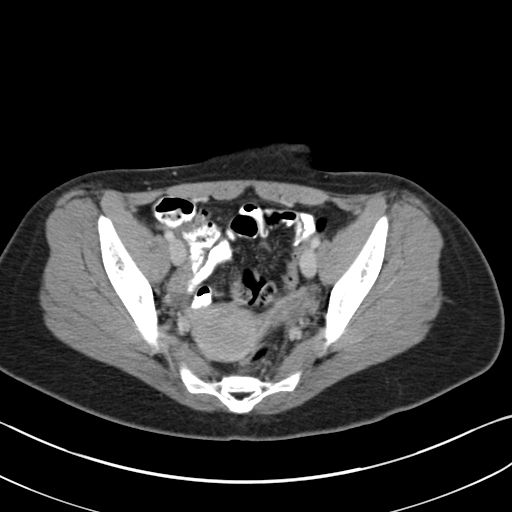
[im 33/90  soft-tissue]
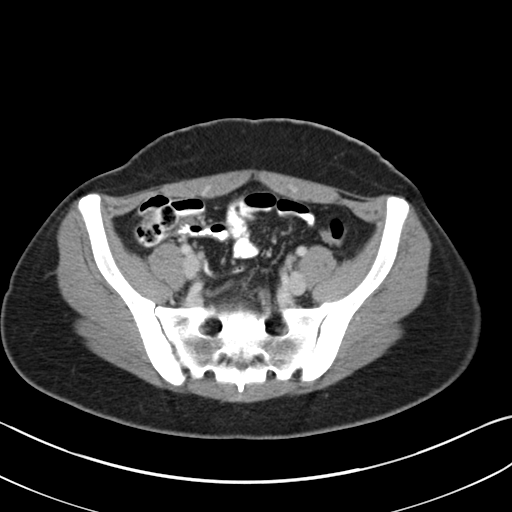
[im 40/90  soft-tissue]
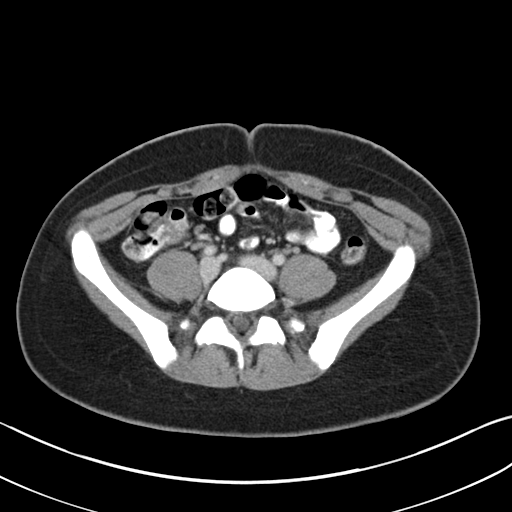
[im 47/90  soft-tissue]
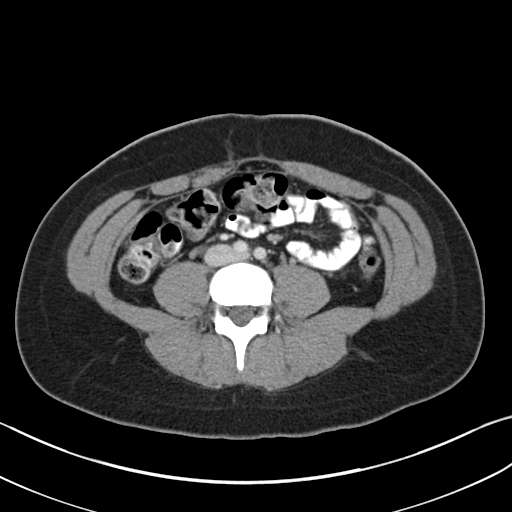
[im 53/90  soft-tissue]
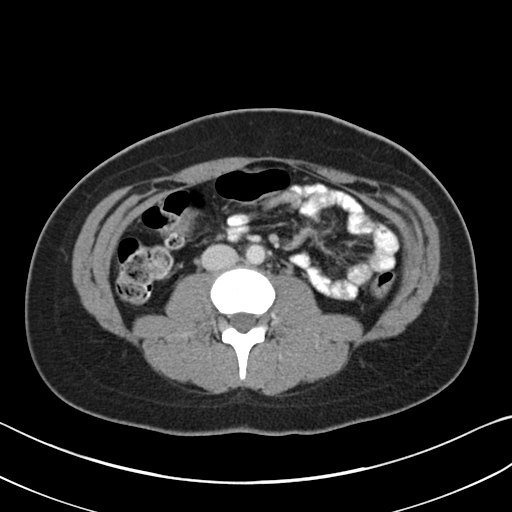
[im 60/90  soft-tissue]
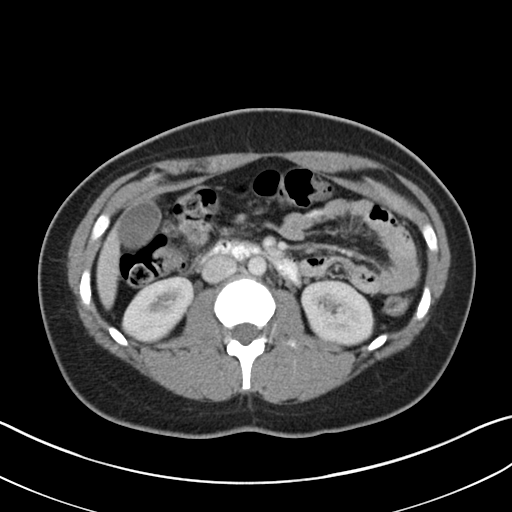
[im 60/90  bone]
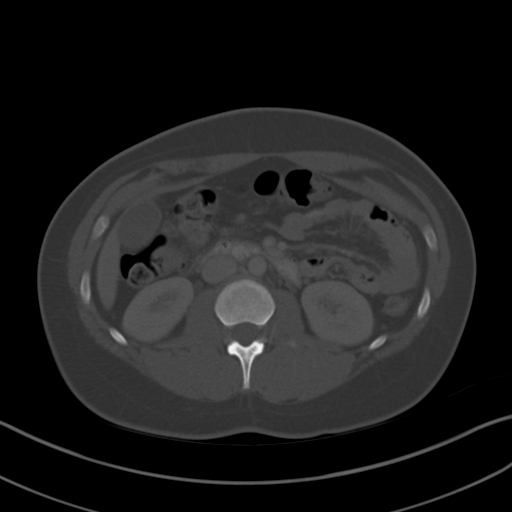
[im 66/90  soft-tissue]
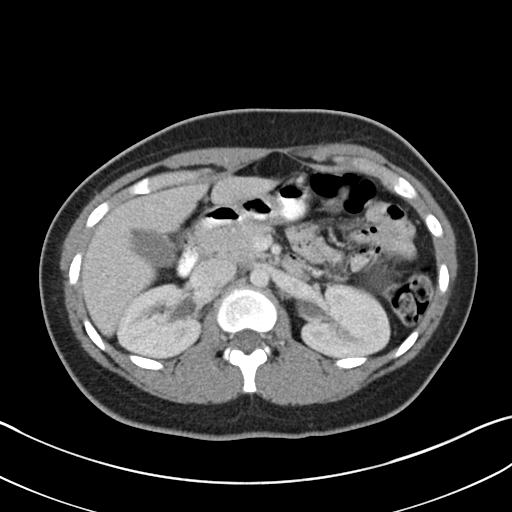
[im 73/90  soft-tissue]
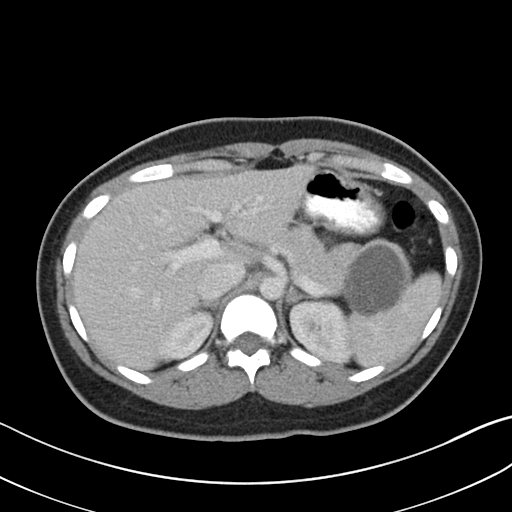
[im 80/90  soft-tissue]
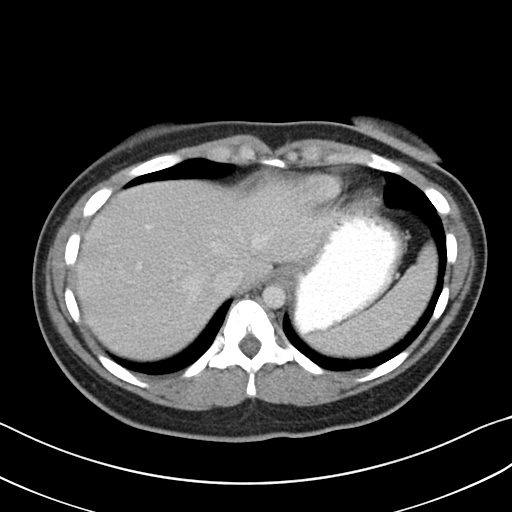
[im 86/90  soft-tissue]
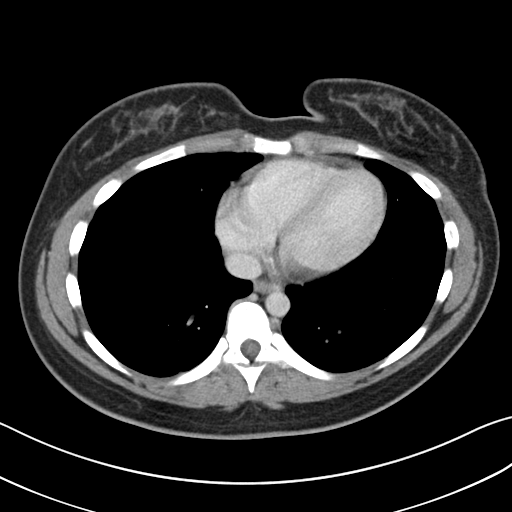

[Series 5: coronal a/|p · coronal · 0.74mm/px · 3 of 82 slices shown]
[im 28/82  soft-tissue]
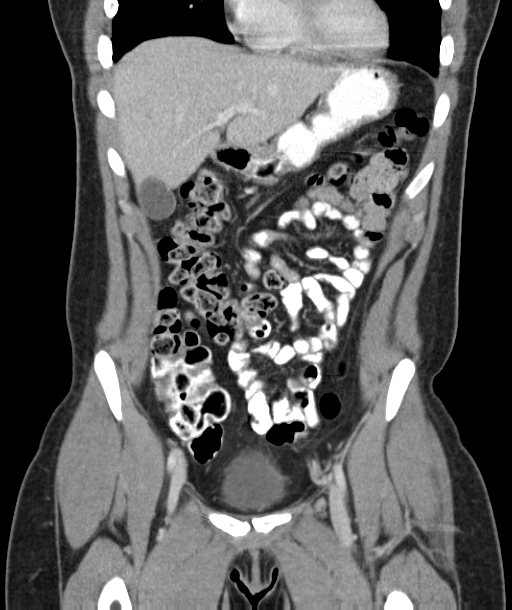
[im 37/82  soft-tissue]
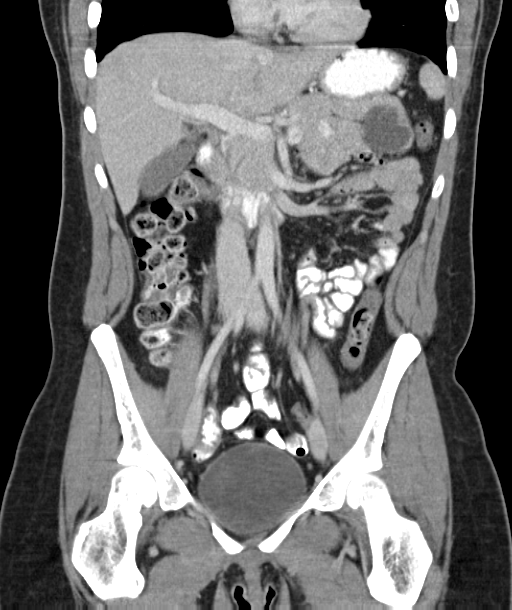
[im 46/82  soft-tissue]
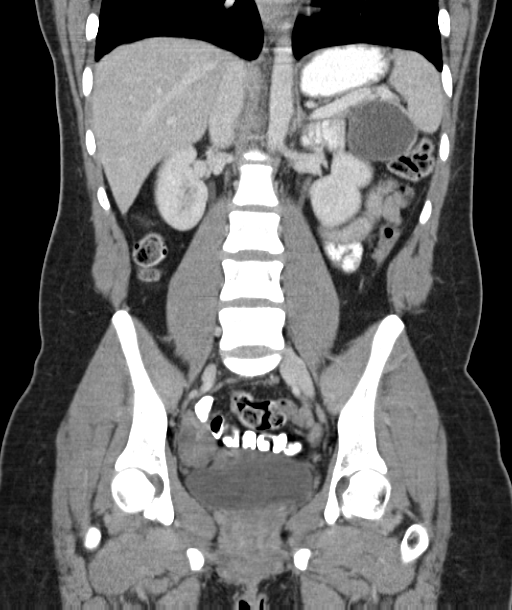

[16 of 46 positions shown; findings below may reference images not displayed]

FINDINGS: The lung bases are clear.

The liver, spleen, gallbladder, adrenal glands, kidneys, abdominal
aorta, inferior vena cava, and retroperitoneal lymph nodes are
unremarkable. The there is a circumscribed cystic collection in the
left upper quadrant adjacent to the pancreatic tail. This measures
about 5.7 x 4.5 cm and has a moderately thickened wall and internal
septation. Etiology is indeterminate. The lesion abuts the spleen
and tail of the pancreas and may represent a cystic pancreatic
lesion such as a mucinous tumor or it could represent an old pseudo
cyst, or a splenic cyst or hemangioma. The lesion also abuts the
anterior wall of the left kidney but appears separate from the
kidney. Mesenteric cystic lesions could also have this appearance.
The stomach, small bowel, and colon are not abnormally distended. No
free air or free fluid in the abdomen. Abdominal wall musculature
appears intact.

Pelvis: The appendix is normal. Uterus and ovaries are not enlarged.
Small amount of free fluid in the pelvis is likely physiologic. No
loculated fluid collections. Bladder wall is not thickened. No
pelvic mass or lymphadenopathy. No evidence of diverticulitis. No
destructive bone lesions.
IMPRESSION: 5.7 cm maximal diameter complex cystic mass in the left upper
quadrant may arise from pancreas, spleen, or mesenteric origin. MRI
may be useful in further characterization of this lesion.

## 2017-02-16 DIAGNOSIS — H905 Unspecified sensorineural hearing loss: Secondary | ICD-10-CM | POA: Diagnosis not present

## 2017-02-16 DIAGNOSIS — L500 Allergic urticaria: Secondary | ICD-10-CM | POA: Diagnosis not present

## 2017-06-16 ENCOUNTER — Encounter: Payer: Self-pay | Admitting: Gastroenterology

## 2017-06-24 ENCOUNTER — Emergency Department (HOSPITAL_COMMUNITY): Payer: Medicaid Other

## 2017-06-24 ENCOUNTER — Emergency Department (HOSPITAL_COMMUNITY)
Admission: EM | Admit: 2017-06-24 | Discharge: 2017-06-24 | Disposition: A | Discharge status: Home / Self Care | Payer: Medicaid Other | Attending: Emergency Medicine | Admitting: Emergency Medicine

## 2017-06-24 ENCOUNTER — Other Ambulatory Visit: Payer: Self-pay

## 2017-06-24 ENCOUNTER — Telehealth: Payer: Self-pay | Admitting: Emergency Medicine

## 2017-06-24 ENCOUNTER — Encounter (HOSPITAL_COMMUNITY): Payer: Self-pay | Admitting: *Deleted

## 2017-06-24 DIAGNOSIS — R1084 Generalized abdominal pain: Secondary | ICD-10-CM | POA: Diagnosis present

## 2017-06-24 DIAGNOSIS — Z79899 Other long term (current) drug therapy: Secondary | ICD-10-CM | POA: Diagnosis not present

## 2017-06-24 DIAGNOSIS — K529 Noninfective gastroenteritis and colitis, unspecified: Secondary | ICD-10-CM | POA: Diagnosis not present

## 2017-06-24 LAB — CBC
HCT: 39.3 % (ref 36.0–46.0)
HEMOGLOBIN: 13.3 g/dL (ref 12.0–15.0)
MCH: 30 pg (ref 26.0–34.0)
MCHC: 33.8 g/dL (ref 30.0–36.0)
MCV: 88.7 fL (ref 78.0–100.0)
Platelets: 261 10*3/uL (ref 150–400)
RBC: 4.43 MIL/uL (ref 3.87–5.11)
RDW: 13.3 % (ref 11.5–15.5)
WBC: 12.6 10*3/uL — ABNORMAL HIGH (ref 4.0–10.5)

## 2017-06-24 LAB — COMPREHENSIVE METABOLIC PANEL
ALT: 12 U/L — ABNORMAL LOW (ref 14–54)
ANION GAP: 10 (ref 5–15)
AST: 19 U/L (ref 15–41)
Albumin: 4.1 g/dL (ref 3.5–5.0)
Alkaline Phosphatase: 64 U/L (ref 38–126)
BUN: 9 mg/dL (ref 6–20)
CHLORIDE: 106 mmol/L (ref 101–111)
CO2: 20 mmol/L — AB (ref 22–32)
Calcium: 9.2 mg/dL (ref 8.9–10.3)
Creatinine, Ser: 0.69 mg/dL (ref 0.44–1.00)
GFR calc non Af Amer: >60 mL/min (ref >60)
Glucose, Bld: 129 mg/dL — ABNORMAL HIGH (ref 65–99)
POTASSIUM: 3.9 mmol/L (ref 3.5–5.1)
Sodium: 136 mmol/L (ref 135–145)
Total Bilirubin: 0.3 mg/dL (ref 0.3–1.2)
Total Protein: 7.6 g/dL (ref 6.5–8.1)

## 2017-06-24 LAB — I-STAT BETA HCG BLOOD, ED (MC, WL, AP ONLY): I-stat hCG, quantitative: <5 m[IU]/mL (ref <5)

## 2017-06-24 LAB — LIPASE, BLOOD: LIPASE: 25 U/L (ref 11–51)

## 2017-06-24 MED ORDER — CIPROFLOXACIN HCL 500 MG PO TABS: 500.0000 mg | ORAL_TABLET | Freq: Two times a day (BID) | ORAL | 0 refills | Status: DC

## 2017-06-24 MED ORDER — ONDANSETRON 4 MG PO TBDP: ORAL_TABLET | ORAL | 0 refills | Status: DC

## 2017-06-24 MED ORDER — PANTOPRAZOLE SODIUM 40 MG IV SOLR
40.0000 mg | Freq: Once | INTRAVENOUS | Status: AC
Start: 2017-06-24 — End: 2017-06-24
  Administered 2017-06-24: 40 mg via INTRAVENOUS
  Filled 2017-06-24: qty 40

## 2017-06-24 MED ORDER — ONDANSETRON HCL 4 MG/2ML IJ SOLN
4.0000 mg | Freq: Once | INTRAMUSCULAR | Status: AC
  Administered 2017-06-24: 4 mg via INTRAVENOUS
  Filled 2017-06-24: qty 2

## 2017-06-24 MED ORDER — METRONIDAZOLE 500 MG PO TABS: 500.0000 mg | ORAL_TABLET | Freq: Three times a day (TID) | ORAL | 0 refills | Status: DC

## 2017-06-24 MED ORDER — DICYCLOMINE HCL 20 MG PO TABS: ORAL_TABLET | ORAL | 0 refills | Status: DC

## 2017-06-24 MED ORDER — IOPAMIDOL (ISOVUE-300) INJECTION 61%
INTRAVENOUS | Status: AC
  Administered 2017-06-24: 100 mL
  Filled 2017-06-24: qty 100

## 2017-06-24 MED ORDER — HYDROCODONE-ACETAMINOPHEN 5-325 MG PO TABS: 1.0000 | ORAL_TABLET | Freq: Four times a day (QID) | ORAL | 0 refills | Status: DC | PRN

## 2017-06-24 MED ORDER — SODIUM CHLORIDE 0.9 % IV BOLUS (SEPSIS)
500.0000 mL | Freq: Once | INTRAVENOUS | Status: AC
  Administered 2017-06-24: 500 mL via INTRAVENOUS

## 2017-06-24 MED ORDER — IOPAMIDOL (ISOVUE-300) INJECTION 61%
INTRAVENOUS | Status: AC
  Filled 2017-06-24: qty 100

## 2017-06-24 MED ORDER — HYDROMORPHONE HCL 1 MG/ML IJ SOLN
1.0000 mg | Freq: Once | INTRAMUSCULAR | Status: AC
  Administered 2017-06-24: 1 mg via INTRAVENOUS
  Filled 2017-06-24: qty 1

## 2017-06-24 NOTE — ED Provider Notes (Signed)
[facility] DEPART[facility] Provider Note   CSN: [id number] Arrival date & time: 06/24/17  4193     History   Chief Complaint Chief Complaint  Patient presents with  . Abdominal Pain    HPI Diana Torres is a 27 y.o. female.  Patient complains of abdominal pain nausea cramping diarrhea  The history is provided by the patient.  Abdominal Pain   This is a new problem. The current episode started more than 2 days ago. The problem occurs constantly. The problem has not changed since onset.The pain is associated with an unknown factor. The pain is located in the generalized abdominal region. The quality of the pain is aching. The pain is at a severity of 4/10. The pain is moderate. Associated symptoms include diarrhea and nausea. Pertinent negatives include anorexia, frequency, hematuria and headaches.    Past Medical History:  Diagnosis Date  . Deaf   . History of broken nose   . 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 Mass    cyst/left upper abd quad  . Miscarriage    pt states had difficulty with high BP prior to miscarriage and afterwards till infection was healed  . PONV (postoperative nausea and vomiting)   . UTI (lower urinary tract infection)     Patient Active Problem List   Diagnosis Date Noted  . Encounter for sterilization 07/08/2016  . Hearing impaired 08/14/2012    Past Surgical History:  Procedure Laterality Date  . COCHLEAR IMPLANT    . 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 LAPAROSCOPIC TUBAL LIGATION Bilateral 07/08/2016   Procedure: LAPAROSCOPIC TUBAL LIGATION WITH FILSHIE CLIPS;  Surgeon: Osborne Oman, MD;  Location: [facility];  Service: Gynecology;  Laterality: Bilateral;  . RHINOPLASTY    . right foot  2016   cyst removed  . TONSILLECTOMY       OB History      Gravida  3   Para  2   Term  2   Preterm      AB  1   Living  2     SAB  1   TAB      Ectopic      Multiple  0   Live Births  2            Home Medications    Prior to Admission medications   Medication Sig Start Date End Date Taking? Authorizing Provider  ciprofloxacin (CIPRO) 500 MG tablet Take 1 tablet (500 mg total) by mouth 2 (two) times daily. One po bid x 7 days 06/24/17   Milton Ferguson, MD  dicyclomine (BENTYL) 20 MG tablet Take 1 every 6-8 hours as needed for abdominal cramping 06/24/17   Milton Ferguson, MD  docusate sodium (COLACE) 100 MG capsule Take 1 capsule (100 mg total) by mouth 2 (two) times daily as needed. 07/08/16   Anyanwu, Sallyanne Havers, MD  ibuprofen (ADVIL,MOTRIN) 600 MG tablet Take 1 tablet (600 mg total) by mouth every 6 (six) hours. 07/08/16   Anyanwu, Sallyanne Havers, MD  metroNIDAZOLE (FLAGYL) 500 MG tablet Take 1 tablet (500 mg total) by mouth 3 (three) times daily. One po bid x 7 days 06/24/17   Milton Ferguson, MD  ondansetron (ZOFRAN ODT) 4 MG disintegrating tablet 4mg  ODT q4 hours prn nausea/vomit 06/24/17   Milton Ferguson, MD  oxyCODONE-acetaminophen (PERCOCET/ROXICET) 5-325 MG tablet Take 1-2 tablets by mouth every 6 (six) hours as needed. 07/08/16   Anyanwu, Sallyanne Havers, MD  prenatal vitamin w/FE, FA (PRENATAL 1 + 1) 27-1 MG TABS tablet Take 1 tablet by mouth daily before breakfast. 07/08/16   Anyanwu, Sallyanne Havers, MD    Family History Family History  Problem Relation Age of Onset  . Hearing loss Mother   . Hypertension Mother   . Alcohol abuse Father   . Hearing loss Father     Social History Social History   Tobacco Use  . Smoking status: Never Smoker  . Smokeless tobacco: Never Used  Substance Use Topics  . Alcohol use: No  . Drug use: No     Allergies   Codeine   Review of Systems Review of Systems  Constitutional: Negative for appetite change and fatigue.  HENT: Negative for congestion, ear discharge and sinus pressure.   Eyes:  Negative for discharge.  Respiratory: Negative for cough.   Cardiovascular: Negative for chest pain.  Gastrointestinal: Positive for abdominal pain, diarrhea and nausea. Negative for anorexia.  Genitourinary: Negative for frequency and hematuria.  Musculoskeletal: Negative for back pain.  Skin: Negative for rash.  Neurological: Negative for seizures and headaches.  Psychiatric/Behavioral: Negative for hallucinations.     Physical Exam Updated Vital Signs BP 114/70   Pulse (!) 103   Temp 98.6 F (37 C) (Oral)   Resp 19   LMP 06/03/2017   SpO2 95%   Physical Exam  Constitutional: She is oriented to person, place, and time. She appears well-developed.  HENT:  Head: Normocephalic.  Eyes: Conjunctivae and EOM are normal. No scleral icterus.  Neck: Neck supple. No thyromegaly present.  Cardiovascular: Normal rate and regular rhythm. Exam reveals no gallop and no friction rub.  No murmur heard. Pulmonary/Chest: No stridor. She has no wheezes. She has no rales. She exhibits no tenderness.  Abdominal: She exhibits no distension. There is tenderness. There is no rebound.  Musculoskeletal: Normal range of motion. She exhibits no edema.  Lymphadenopathy:    She has no cervical adenopathy.  Neurological: She is oriented to person, place, and time. She exhibits normal muscle tone. Coordination normal.  Skin: No rash noted. No erythema.  Psychiatric: She has a normal mood and affect. Her behavior is normal.     ED Treatments / Results  Labs (all labs ordered are listed, but only abnormal results are displayed) Labs Reviewed  COMPREHENSIVE METABOLIC PANEL - Abnormal; Notable for the following components:      Result Value   CO2 20 (*)    Glucose, Bld 129 (*)    ALT 12 (*)    All other components within normal limits  CBC - Abnormal; Notable for the following components:   WBC 12.6 (*)    All other components within normal limits  LIPASE, BLOOD  URINALYSIS, ROUTINE W REFLEX  MICROSCOPIC  I-STAT BETA HCG BLOOD, ED ([facility], [facility], [facility] ONLY)    EKG None  Radiology Ct Abdomen Pelvis W Contrast  Result Date: 06/24/2017 CLINICAL DATA:  Mid abdominal pain with diarrhea and nausea EXAM: CT ABDOMEN AND PELVIS WITH CONTRAST TECHNIQUE: Multidetector CT imaging of the abdomen and pelvis was performed using the standard protocol following bolus administration of intravenous contrast. CONTRAST:  133mL ISOVUE-300 IOPAMIDOL (ISOVUE-300) INJECTION 61% COMPARISON:  CT 08/26/2016, 08/14/2015, 06/05/2015 FINDINGS: Lower chest: No acute abnormality. Hepatobiliary: No focal liver abnormality is seen. No gallstones, gallbladder wall thickening, or biliary dilatation. Pancreas: Postsurgical  changes at the tail of the pancreas consistent with distal pancreatectomy. No inflammatory changes or mass lesion. Spleen: Normal in size without focal abnormality. Adrenals/Urinary Tract: Adrenal glands are unremarkable. Kidneys are normal, without renal calculi, focal lesion, or hydronephrosis. Bladder is unremarkable. Stomach/Bowel: Stomach is nonenlarged. No dilated small bowel. Normal appendix. Probable mild colitis type changes of the ascending colon, hepatic flexure and transverse colon. Vascular/Lymphatic: No significant vascular findings are present. No enlarged abdominal or pelvic lymph nodes. Reproductive: Uterus and bilateral adnexa are unremarkable. Bilateral tubal ligation clips. Other: Trace free fluid in the pelvis.  No free air Musculoskeletal: No acute or significant osseous findings. IMPRESSION: 1. Possible mild colitis type changes involving the ascending colon, hepatic flexure, and transverse colon as may be seen with infectious or inflammatory bowel disease. 2. Stable postsurgical changes of the pancreatic tail. 3. Trace free fluid in the pelvis. Electronically Signed   By: Donavan Foil M.D.   On: 06/24/2017 14:47    Procedures Procedures (including critical care time)  Medications Ordered in  ED Medications  iopamidol (ISOVUE-300) 61 % injection (has no administration in time range)  ondansetron (ZOFRAN) injection 4 mg (4 mg Intravenous Given 06/24/17 1319)  pantoprazole (PROTONIX) injection 40 mg (40 mg Intravenous Given 06/24/17 1319)  HYDROmorphone (DILAUDID) injection 1 mg (1 mg Intravenous Given 06/24/17 1319)  sodium chloride 0.9 % bolus 500 mL (0 mLs Intravenous Stopped 06/24/17 1412)  iopamidol (ISOVUE-300) 61 % injection (100 mLs  Contrast Given 06/24/17 1425)     Initial Impression / Assessment and Plan / ED Course  I have reviewed the triage vital signs and the nursing notes.  Pertinent labs & imaging results that were available during my care of the patient were reviewed by me and considered in my medical decision making (see chart for details).     CT scan shows colitis.  Patient will be placed on Cipro Flagyl Zofran and Bentyl will follow up with her PCP  Final Clinical Impressions(s) / ED Diagnoses   Final diagnoses:  Colitis    ED Discharge Orders        Ordered    metroNIDAZOLE (FLAGYL) 500 MG tablet  3 times daily     06/24/17 1546    ciprofloxacin (CIPRO) 500 MG tablet  2 times daily     06/24/17 1546    ondansetron (ZOFRAN ODT) 4 MG disintegrating tablet     06/24/17 1546    dicyclomine (BENTYL) 20 MG tablet     06/24/17 1546       Milton Ferguson, MD 06/24/17 1555

## 2017-06-24 NOTE — ED Notes (Signed)
ED Provider at bedside. 

## 2017-06-24 NOTE — Discharge Instructions (Addendum)
Follow up with your md next week.  Drink plenty of fluids °

## 2017-06-24 NOTE — Telephone Encounter (Signed)
CM received phone call from [facility] with [facility] requesting clarification on the flagyl prescription written for the pt earlier today by Dr. Roderic Palau.  He wrote for 500 mg x 3 per day at 21 tablets but also commented one po BID x 7 days.  CM confirmed with Janit Bern, PA that orders should read 500 mg one tab BID x 7 days for 14 tablets.  Returned call to [facility] with correct orders.  No further CM needs noted at this time.

## 2017-06-24 NOTE — ED Triage Notes (Addendum)
Pt is deaf. Per interpretor, pt having onset last night of mid abd pain into her back. Having diarrhea and  Nausea. No vomiting. Pt appears very uncomfortable at triage.

## 2017-07-28 ENCOUNTER — Encounter: Payer: Self-pay | Admitting: *Deleted

## 2017-08-08 ENCOUNTER — Encounter: Payer: Self-pay | Admitting: Gastroenterology

## 2017-08-08 ENCOUNTER — Ambulatory Visit: Payer: Medicaid Other | Admitting: Gastroenterology

## 2017-08-08 VITALS — BP 106/74 | HR 80 | Ht 63.0 in | Wt 169.4 lb

## 2017-08-08 DIAGNOSIS — G8929 Other chronic pain: Secondary | ICD-10-CM

## 2017-08-08 DIAGNOSIS — R1013 Epigastric pain: Secondary | ICD-10-CM

## 2017-08-08 DIAGNOSIS — R110 Nausea: Secondary | ICD-10-CM

## 2017-08-08 MED ORDER — HYOSCYAMINE SULFATE 0.125 MG SL SUBL: 0.1250 mg | SUBLINGUAL_TABLET | Freq: Four times a day (QID) | SUBLINGUAL | 1 refills | Status: DC | PRN

## 2017-08-08 NOTE — Progress Notes (Signed)
[facility] Consult Note:  History: Diana Torres [birth year]  Referring physician:  Stark Klein, MD  Reason for consult/chief complaint: Abdominal Pain (epigastric pain x1 year. C/O bloating and nausea off/on X 6 months.  Pt c/o a lot of phlegm since pancraeas surgery.)   Subjective  HPI:  This is a very pleasant 27 year old woman seen with the aid of a sign interpreter.  She was referred by her surgeon noted above for a constellation of symptoms that she reports have been going on since a surgery in February 2017.  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  I read the operative report, it sounds like an uncomplicated procedure sparing the spleen.  Pathology is noted below.  The patient reports that ever since then she has had copious production of mucus "50 times per day" with belching and episodic crampy upper abdominal pain.  She has early satiety bloating and nausea as well.  She denies dysphagia or odynophagia, anorexia or weight loss.  There is occasional diarrhea.  She was seen in the emergency department on June 24, 2017 for abdominal cramps and diarrhea.  CT scan suggested possible colitis, she was treated with ciprofloxacin, metronidazole and Bentyl. She recovered from that episode, and it sounds as if it may have been an acute infection. She reports her bowel habits as regular, but later says she has occasional loose stool, and denies rectal bleeding.  ROS:  Review of Systems  Constitutional: Negative for appetite change and unexpected weight change.  HENT: Negative for mouth sores and voice change.   Eyes: Negative for pain and redness.  Respiratory: Negative for cough and shortness of breath.   Cardiovascular: Negative for chest pain and palpitations.  Genitourinary: Negative for dysuria and hematuria.  Musculoskeletal: Negative for arthralgias and myalgias.    Skin: Negative for pallor and rash.  Neurological: Negative for weakness and headaches.  Hematological: Negative for adenopathy.     Past Medical History: Past Medical History:  Diagnosis Date  . Deaf   . History of broken nose   . 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 Mass    cyst/left upper abd quad  . Miscarriage    pt states had difficulty with high BP prior to miscarriage and afterwards till infection was healed  . PONV (postoperative nausea and vomiting)   . UTI (lower urinary tract infection)      Past Surgical History: Past Surgical History:  Procedure Laterality Date  . COCHLEAR IMPLANT    . 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 LAPAROSCOPIC TUBAL LIGATION Bilateral 07/08/2016   Procedure: LAPAROSCOPIC TUBAL LIGATION WITH FILSHIE CLIPS;  Surgeon: Osborne Oman, MD;  Location: [facility];  Service: Gynecology;  Laterality: Bilateral;  . RHINOPLASTY    . right foot  2016   cyst removed  . TONSILLECTOMY       Family History: Family History  Problem Relation Age of Onset  . Hearing loss Mother   . Hypertension Mother   . Alcohol abuse Father   . Hearing loss Father   . Pancreatic cancer Maternal Grandmother     Social History: Social History   Socioeconomic History  . Marital status: Married    Spouse name: Not on file  . Number of children: 2  .  Years of education: Not on file  . Highest education level: Not on file  Occupational History  . Occupation: [employer]  Social Needs  . Financial resource strain: Not on file  . Food insecurity:    Worry: Not on file    Inability: Not on file  . Transportation needs:    Medical: Not on file    Non-medical: Not on file  Tobacco Use  . Smoking status: Never Smoker  . Smokeless tobacco: Never Used  Substance and Sexual Activity  . Alcohol  use: No  . Drug use: No  . Sexual activity: Not Currently  Lifestyle  . Physical activity:    Days per week: Not on file    Minutes per session: Not on file  . Stress: Not on file  Relationships  . Social connections:    Talks on phone: Not on file    Gets together: Not on file    Attends religious service: Not on file    Active member of club or organization: Not on file    Attends meetings of clubs or organizations: Not on file    Relationship status: Not on file  Other Topics Concern  . Not on file  Social History Narrative  . Not on file    Allergies: Allergies  Allergen Reactions  . Codeine Itching and Rash    Outpatient Meds: Current Outpatient Medications  Medication Sig Dispense Refill  . dicyclomine (BENTYL) 20 MG tablet Take 1 every 6-8 hours as needed for abdominal cramping 20 tablet 0  . ibuprofen (ADVIL,MOTRIN) 600 MG tablet Take 1 tablet (600 mg total) by mouth every 6 (six) hours. 60 tablet 2  . ondansetron (ZOFRAN ODT) 4 MG disintegrating tablet 4mg  ODT q4 hours prn nausea/vomit 12 tablet 0  . hyoscyamine (LEVSIN SL) 0.125 MG SL tablet Place 1 tablet (0.125 mg total) under the tongue every 6 (six) hours as needed. 30 tablet 1   No current facility-administered medications for this visit.       ___________________________________________________________________ Objective   Exam:  BP 106/74   Pulse 80   Ht 5\' 3"  (1.6 m)   Wt 169 lb 6 oz (76.8 kg)   BMI 30.00 kg/m    General: this is a(n) well-appearing woman  Eyes: sclera anicteric, no redness  ENT: oral mucosa moist without lesions, no cervical or supraclavicular lymphadenopathy, good dentition  CV: RRR without murmur, S1/S2, no JVD, no peripheral edema  Resp: clear to auscultation bilaterally, normal RR and effort noted  GI: soft, mild epigastric tenderness, with active bowel sounds. No guarding or palpable organomegaly noted.  Skin; warm and dry, no rash or jaundice noted  Neuro:  awake, alert and oriented x 3. Normal gross motor function and fluent speech  Labs:  CBC Latest Ref Rng & Units 06/24/2017 06/30/2016 05/26/2016  WBC 4.0 - 10.5 K/uL 12.6(H) 7.4 12.4(H)  Hemoglobin 12.0 - 15.0 g/dL 13.3 13.3 12.2  Hematocrit 36.0 - 46.0 % 39.3 40.8 36.2  Platelets 150 - 400 K/uL 261 238 198   CMP Latest Ref Rng & Units 06/24/2017 05/19/2016 04/20/2016  Glucose 65 - 99 mg/dL 129(H) 90 85  BUN 6 - 20 mg/dL 9 11 10   Creatinine 0.44 - 1.00 mg/dL 0.69 0.64 0.50  Sodium 135 - 145 mmol/L 136 131(L) 132(L)  Potassium 3.5 - 5.1 mmol/L 3.9 3.8 3.8  Chloride 101 - 111 mmol/L 106 102 103  CO2 22 - 32 mmol/L 20(L) 20(L) 21(L)  Calcium 8.9 - 10.3 mg/dL  9.2 8.8(L) 9.3  Total Protein 6.5 - 8.1 g/dL 7.6 7.2 7.0  Total Bilirubin 0.3 - 1.2 mg/dL 0.3 0.4 0.4  Alkaline Phos 38 - 126 U/L 64 221(H) 170(H)  AST 15 - 41 U/L 19 18 16   ALT 14 - 54 U/L 12(L) 10(L) 15   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 (personally reviewed - mild wall thickening)  Assessment: Encounter Diagnoses  Name Primary?  . Abdominal pain, chronic, epigastric Yes  . Nausea without vomiting     Symptoms that are somewhat difficult to characterize, also unclear relation to the surgery, though she feels strongly that they are temporally related.  There was no gastric resection or injury, but I wonder if she could have had some change in gastric motility as a result.  I suspect the illness leading to the ED visit in late March was unrelated.  I reviewed the CT and it does not appear to be a true colitis.  Plan:  Trial of Levsin twice daily Gastric emptying study Upper endoscopy.  She is agreeable after discussion of procedure and risks.  All of this was done with the sign interpreter present for the entire encounter.  The benefits and risks of the planned procedure were  described in detail with the patient or (when appropriate) their health care proxy.  Risks were outlined as including, but not limited to, bleeding, infection, perforation, adverse medication reaction leading to cardiac or pulmonary decompensation, or pancreatitis (if ERCP).  The limitation of incomplete mucosal visualization was also discussed.  No guarantees or warranties were given.  Extra time required for use of interpreter.  Thank you for the courtesy of this consult.  Please call me with any questions or concerns.  Nelida Meuse III  CC: Stark Klein, MD

## 2017-08-08 NOTE — Patient Instructions (Addendum)
If you are age 26 or older, your body mass index should be between 23-30. Your Body mass index is 30 kg/m. If this is out of the aforementioned range listed, please consider follow up with your Primary Care Provider.  If you are age 46 or younger, your body mass index should be between 19-25. Your Body mass index is 30 kg/m. If this is out of the aformentioned range listed, please consider follow up with your Primary Care Provider.   You have been scheduled for an endoscopy. Please follow written instructions given to you at your visit today. If you use inhalers (even only as needed), please bring them with you on the day of your procedure. Your physician has requested that you go to www.startemmi.com and enter the access code given to you at your visit today. This web site gives a general overview about your procedure. However, you should still follow specific instructions given to you by our office regarding your preparation for the procedure.  You have been scheduled for a gastric emptying scan at [facility] on 08-30-2017 at 700am. Please arrive at least 15 minutes prior to your appointment for registration. Please make certain not to have anything to eat or drink after midnight the night before your test. Hold all stomach medications (ex: Zofran, phenergan, Reglan) 48 hours prior to your test. If you need to reschedule your appointment, please contact radiology scheduling at [phone]. _____________________________________________________________________ A gastric-emptying study measures how long it takes for food to move through your stomach. There are several ways to measure stomach emptying. In the most common test, you eat food that contains a small amount of radioactive material. A scanner that detects the movement of the radioactive material is placed over your abdomen to monitor the rate at which food leaves your stomach. This test normally takes about 4 hours to  complete. _____________________________________________________________________    Thank you for choosing [facility]  Dr Wilfrid Lund III

## 2017-08-11 ENCOUNTER — Other Ambulatory Visit: Payer: Self-pay

## 2017-08-11 DIAGNOSIS — R110 Nausea: Secondary | ICD-10-CM

## 2017-08-11 DIAGNOSIS — R1013 Epigastric pain: Principal | ICD-10-CM

## 2017-08-11 DIAGNOSIS — G8929 Other chronic pain: Secondary | ICD-10-CM

## 2017-08-19 ENCOUNTER — Other Ambulatory Visit: Payer: Self-pay

## 2017-08-19 ENCOUNTER — Ambulatory Visit (AMBULATORY_SURGERY_CENTER): Payer: Medicaid Other | Admitting: Gastroenterology

## 2017-08-19 ENCOUNTER — Encounter: Payer: Self-pay | Admitting: Gastroenterology

## 2017-08-19 VITALS — BP 130/85 | HR 100 | Temp 98.7°F | Resp 18 | Ht 63.0 in | Wt 169.0 lb

## 2017-08-19 DIAGNOSIS — R142 Eructation: Secondary | ICD-10-CM | POA: Diagnosis not present

## 2017-08-19 DIAGNOSIS — R1013 Epigastric pain: Secondary | ICD-10-CM | POA: Diagnosis not present

## 2017-08-19 DIAGNOSIS — K2970 Gastritis, unspecified, without bleeding: Secondary | ICD-10-CM

## 2017-08-19 DIAGNOSIS — R140 Abdominal distension (gaseous): Secondary | ICD-10-CM | POA: Diagnosis not present

## 2017-08-19 MED ORDER — ONDANSETRON HCL 4 MG/5ML PO SOLN: 4.0000 mg | Freq: Once | ORAL | Status: DC

## 2017-08-19 MED ORDER — SODIUM CHLORIDE 0.9 % IV SOLN: 500.0000 mL | Freq: Once | INTRAVENOUS | Status: DC

## 2017-08-19 MED ORDER — ONDANSETRON HCL 4 MG/2ML IJ SOLN
4.0000 mg | Freq: Once | INTRAMUSCULAR | Status: AC
  Administered 2017-08-19: 4 mg via INTRAVENOUS

## 2017-08-19 NOTE — Op Note (Addendum)
[facility] Patient Name: Diana Torres Procedure Date: 08/19/2017 8:34 AM MRN: [id number] Endoscopist: Perry. Loletha Carrow , MD Age: 27 Referring MD:  Date of Birth: 1990/10/23 Gender: Female Account #: [id number] Procedure:                Upper GI endoscopy Indications:              Epigastric abdominal pain, Abdominal bloating,                            Eructation Medicines:                Monitored Anesthesia Care Procedure:                Pre-Anesthesia Assessment:                           - Prior to the procedure, a History and Physical                            was performed, and patient medications and                            allergies were reviewed. The patient's tolerance of                            previous anesthesia was also reviewed. The risks                            and benefits of the procedure and the sedation                            options and risks were discussed with the patient.                            All questions were answered, and informed consent                            was obtained. Prior Anticoagulants: The patient has                            taken no previous anticoagulant or antiplatelet                            agents. ASA Grade Assessment: II - A patient with                            mild systemic disease. After reviewing the risks                            and benefits, the patient was deemed in                            satisfactory condition to undergo the procedure.  After obtaining informed consent, the endoscope was                            passed under direct vision. Throughout the                            procedure, the patient's blood pressure, pulse, and                            oxygen saturations were monitored continuously. The                            Endoscope was introduced through the mouth, and                            advanced to the second part of duodenum. The  upper                            GI endoscopy was accomplished without difficulty.                            The patient tolerated the procedure well. Scope In: Scope Out: Findings:                 The esophagus was normal.                           Patchy mild inflammation characterized by erosions                            and erythema was found in the gastric antrum.                            Biopsies were taken from the antrum and body with a                            cold forceps for Helicobacter pylori testing using                            CLOtest.                           The cardia and gastric fundus were normal on                            retroflexion.                           The examined duodenum was normal. Complications:            No immediate complications. Estimated Blood Loss:     Estimated blood loss was minimal. Impression:               - Normal esophagus.                           -  Gastritis. Biopsied.                           - Normal examined duodenum. Recommendation:           - Patient has a contact number available for                            emergencies. The signs and symptoms of potential                            delayed complications were discussed with the                            patient. Return to normal activities tomorrow.                            Written discharge instructions were provided to the                            patient.                           - Resume previous diet.                           - Continue present medications.                           - Await pathology results.                           - Return to my office after studies are complete                            (including biopsy and upcoming gastric emptying                            study. Chilton Sallade L. Loletha Carrow, MD 08/19/2017 9:00:06 AM This report has been signed electronically.

## 2017-08-19 NOTE — Progress Notes (Signed)
Called to room to assist during endoscopic procedure.  Patient ID and intended procedure confirmed with present staff. Received instructions for my participation in the procedure from the performing physician.  

## 2017-08-19 NOTE — Progress Notes (Signed)
I have reviewed the patient's medical history in detail and updated the computerized patient record.

## 2017-08-19 NOTE — Progress Notes (Signed)
A and O x3. Report to RN. Tolerated MAC anesthesia well.Teeth unchanged after procedure.

## 2017-08-19 NOTE — Patient Instructions (Addendum)
Thank you for allowing [place] to care for you today!  Await pathology results by mail, approx. 2 weeks.  Return to see Dr. Loletha Carrow after  studies (upcoming gastric emptying study) and pathology results are returned.   Resume previous diet and medications.   Start with softer foods and progress during the day as tolerated.     YOU HAD AN ENDOSCOPIC PROCEDURE TODAY AT [facility]:   Refer to the procedure report that was given to you for any specific questions about what was found during the examination.  If the procedure report does not answer your questions, please call your gastroenterologist to clarify.  If you requested that your care partner not be given the details of your procedure findings, then the procedure report has been included in a sealed envelope for you to review at your convenience later.  YOU SHOULD EXPECT: Some feelings of bloating in the abdomen. Passage of more gas than usual.  Walking can help get rid of the air that was put into your GI tract during the procedure and reduce the bloating. If you had a lower endoscopy (such as a colonoscopy or flexible sigmoidoscopy) you may notice spotting of blood in your stool or on the toilet paper. If you underwent a bowel prep for your procedure, you may not have a normal bowel movement for a few days.  Please Note:  You might notice some irritation and congestion in your nose or some drainage.  This is from the oxygen used during your procedure.  There is no need for concern and it should clear up in a day or so.  SYMPTOMS TO REPORT IMMEDIATELY:     Following upper endoscopy (EGD)  Vomiting of blood or coffee ground material  New chest pain or pain under the shoulder blades  Painful or persistently difficult swallowing  New shortness of breath  Fever of 100F or higher  Black, tarry-looking stools  For urgent or emergent issues, a gastroenterologist can be reached at any hour by calling [phone].   DIET:   We do recommend a small meal at first, but then you may proceed to your regular diet.  Drink plenty of fluids but you should avoid alcoholic beverages for 24 hours.  ACTIVITY:  You should plan to take it easy for the rest of today and you should NOT DRIVE or use heavy machinery until tomorrow (because of the sedation medicines used during the test).    FOLLOW UP: Our staff will call the number listed on your records the next business day following your procedure to check on you and address any questions or concerns that you may have regarding the information given to you following your procedure. If we do not reach you, we will leave a message.  However, if you are feeling well and you are not experiencing any problems, there is no need to return our call.  We will assume that you have returned to your regular daily activities without incident.  If any biopsies were taken you will be contacted by phone or by letter within the next 1-3 weeks.  Please call us at [phone] if you have not heard about the biopsies in 3 weeks.    SIGNATURES/CONFIDENTIALITY: You and/or your care partner have signed paperwork which will be entered into your electronic medical record.  These signatures attest to the fact that that the information above on your After Visit Summary has been reviewed and is understood.  Full responsibility of the  confidentiality of this discharge information lies with you and/or your care-partner. 

## 2017-08-22 ENCOUNTER — Telehealth: Payer: Self-pay

## 2017-08-22 LAB — HELICOBACTER PYLORI SCREEN-BIOPSY: UREASE: NEGATIVE

## 2017-08-22 NOTE — Telephone Encounter (Signed)
Attempted to reach pt. With follow-up call following endoscopic procedure 08/19/2017.  LM on pt. Cell phone.  Will try to reach pt. Again later today.

## 2017-08-22 NOTE — Telephone Encounter (Signed)
Pt's mother returned your call and inform that pt is doing well after proc.

## 2017-08-23 ENCOUNTER — Ambulatory Visit (HOSPITAL_COMMUNITY): Payer: Medicaid Other

## 2017-08-30 ENCOUNTER — Encounter (HOSPITAL_COMMUNITY): Payer: Self-pay | Admitting: Radiology

## 2017-08-30 ENCOUNTER — Encounter (HOSPITAL_COMMUNITY)
Admission: RE | Admit: 2017-08-30 | Discharge: 2017-08-30 | Disposition: A | Discharge status: Home / Self Care | Payer: Medicaid Other | Source: Ambulatory Visit | Attending: Gastroenterology | Admitting: Gastroenterology

## 2017-08-30 DIAGNOSIS — R110 Nausea: Secondary | ICD-10-CM | POA: Insufficient documentation

## 2017-08-30 DIAGNOSIS — G8929 Other chronic pain: Secondary | ICD-10-CM | POA: Diagnosis present

## 2017-08-30 DIAGNOSIS — R1013 Epigastric pain: Secondary | ICD-10-CM | POA: Diagnosis not present

## 2017-08-30 MED ORDER — TECHNETIUM TC 99M SULFUR COLLOID
1.9000 | Freq: Once | INTRAVENOUS | Status: AC | PRN
  Administered 2017-08-30: 1.9 via INTRAVENOUS

## 2017-08-31 ENCOUNTER — Encounter: Payer: Self-pay | Admitting: Gastroenterology

## 2017-09-16 NOTE — Telephone Encounter (Signed)
Preadmission screen  

## 2017-09-23 ENCOUNTER — Encounter: Payer: Self-pay | Admitting: Gastroenterology

## 2017-09-23 ENCOUNTER — Ambulatory Visit (INDEPENDENT_AMBULATORY_CARE_PROVIDER_SITE_OTHER): Payer: Medicaid Other | Admitting: Gastroenterology

## 2017-09-23 VITALS — BP 110/74 | HR 80 | Ht 63.0 in | Wt 167.2 lb

## 2017-09-23 DIAGNOSIS — R140 Abdominal distension (gaseous): Secondary | ICD-10-CM | POA: Diagnosis not present

## 2017-09-23 DIAGNOSIS — R1013 Epigastric pain: Secondary | ICD-10-CM | POA: Diagnosis not present

## 2017-09-23 DIAGNOSIS — R142 Eructation: Secondary | ICD-10-CM

## 2017-09-23 MED ORDER — METHSCOPOLAMINE BROMIDE 2.5 MG PO TABS: 2.5000 mg | ORAL_TABLET | Freq: Two times a day (BID) | ORAL | 2 refills | Status: DC | PRN

## 2017-09-23 NOTE — Patient Instructions (Signed)
If you are age 26 or older, your body mass index should be between 23-30. Your Body mass index is 29.63 kg/m. If this is out of the aforementioned range listed, please consider follow up with your Primary Care Provider.  If you are age 27 or younger, your body mass index should be between 19-25. Your Body mass index is 29.63 kg/m. If this is out of the aformentioned range listed, please consider follow up with your Primary Care Provider.   It was a pleasure to see you today!  Dr. Loletha Carrow

## 2017-09-23 NOTE — Progress Notes (Signed)
     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  She also complains of frequent mucus production dozens of times per day.  It has remained unclear how or if this was related to the surgery, though she feels strongly it was temporally related.  I initially saw her in clinic on 08/08/2017.  Upper endoscopy and gastric emptying study have since been normal.  She had been given a trial of hyoscyamine that improved epigastric cramps but has not improved nausea very much for the copious production of mucus that still plagues her. She is here today with her mother and a sign interpreter. Appetite is good and weight stable. ROS: Cardiovascular:  no chest pain Respiratory: no dyspnea  The patient's Past Medical, Family and Social History were reviewed and are on file in the EMR.  Objective:  Med list reviewed  Current Outpatient Medications:  .  hyoscyamine (LEVSIN SL) 0.125 MG SL tablet, Place 1 tablet (0.125 mg total) under the tongue every 6 (six) hours as needed., Disp: 30 tablet, Rfl: 1 .  ibuprofen (ADVIL,MOTRIN) 600 MG tablet, Take 1 tablet (600 mg total) by mouth every 6 (six) hours., Disp: 60 tablet, Rfl: 2 .  methscopolamine (PAMINE) 2.5 MG TABS tablet, Take 1 tablet (2.5 mg total) by mouth 2 (two) times daily as needed., Disp: 30 tablet, Rfl: 2  Current Facility-Administered Medications:  .  0.9 %  sodium chloride infusion, 500 mL, Intravenous, Once, Danis, Estill Cotta III, MD   Vital signs in last 24 hrs: Vitals:   09/23/17 1332  BP: 110/74  Pulse: 80    Physical Exam  She is well-appearing with good muscle mass  HEENT: sclera anicteric, oral mucosa moist without lesions  Neck: supple, no thyromegaly, JVD or lymphadenopathy  Cardiac: RRR without murmurs, S1S2 heard, no peripheral edema  Pulm: clear to  auscultation bilaterally, normal RR and effort noted  Abdomen: soft, mild epigastric tenderness, with active bowel sounds. No guarding or palpable hepatosplenomegaly.  Skin; warm and dry, no jaundice or rash  Recent Labs: Gastric emptying study  normal Clo test negative  @ASSESSMENTPLANBEGIN @ Assessment: Encounter Diagnoses  Name Primary?  . Abdominal pain, epigastric Yes  . Abdominal bloating   . Belching    This patient has functional upper digestive symptoms as well as reported excess respiratory mucus production.  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  She has been searching endlessly for answer and solution to the symptoms.  I done my best to explain the normal test results thus far and how I think they are reassuring.  She has had some improvement from hyoscyamine.  I do not think there are any other tests that would be revealing, have little else to offer for therapy.  I have agreed to a trial of Pamine (scopolamine) 2.5 mg twice a day as needed instead of the hyoscyamine.  Typical side effects of fatigue, dry mouth or blurred vision reviewed.  She understands not to take it along with the hyoscyamine.   She can then decide which medicine is more helpful for symptom control.   Total time 25 minutes, all spent face-to-face with patient in counseling and coordination of care.   Nelida Meuse III

## 2018-02-02 DIAGNOSIS — H903 Sensorineural hearing loss, bilateral: Secondary | ICD-10-CM | POA: Diagnosis not present

## 2018-04-10 ENCOUNTER — Telehealth: Payer: Self-pay | Admitting: Family Medicine

## 2018-04-10 NOTE — Telephone Encounter (Signed)
Called in to make an appointment due to painful period cramps. Stated her cycle is regular however the cramps are on her right side and she can barely walk.

## 2018-04-12 ENCOUNTER — Encounter: Payer: Self-pay | Admitting: Medical

## 2018-04-12 ENCOUNTER — Ambulatory Visit (INDEPENDENT_AMBULATORY_CARE_PROVIDER_SITE_OTHER): Payer: Medicaid Other | Admitting: Medical

## 2018-04-12 VITALS — BP 114/72 | HR 67 | Wt 159.0 lb

## 2018-04-12 DIAGNOSIS — N946 Dysmenorrhea, unspecified: Secondary | ICD-10-CM | POA: Diagnosis not present

## 2018-04-12 DIAGNOSIS — Z23 Encounter for immunization: Secondary | ICD-10-CM

## 2018-04-12 MED ORDER — OXYCODONE-ACETAMINOPHEN 5-325 MG PO TABS: 1.0000 | ORAL_TABLET | ORAL | 0 refills | Status: DC | PRN

## 2018-04-12 NOTE — Patient Instructions (Signed)
Dysmenorrhea  Dysmenorrhea means painful cramps during your period (menstrual period). You will have pain in your lower belly (abdomen). The pain is caused by the tightening (contracting) of the muscles of the womb (uterus). The pain may be mild or very bad. With this condition, you may:  Have a headache.  Feel sick to your stomach (nauseous).  Throw up (vomit).  Have lower back pain.  Follow these instructions at home:  Helping pain and cramping    Put heat on your lower back or belly when you have pain or cramps. Use the heat source that your doctor tells you to use.  Place a towel between your skin and the heat.  Leave the heat on for 20-30 minutes.  Remove the heat if your skin turns bright red. This is especially important if you cannot feel pain, heat, or cold.  Do not have a heating pad on during sleep.  Do aerobic exercises. These include walking, swimming, or biking. These may help with cramps.  Massage your lower back or belly. This may help lessen pain.  General instructions  Take over-the-counter and prescription medicines only as told by your doctor.  Do not drive or use heavy machinery while taking prescription pain medicine.  Avoid alcohol and caffeine during and right before your period. These can make cramps worse.  Do not use any products that have nicotine or tobacco. These include cigarettes and e-cigarettes. If you need help quitting, ask your doctor.  Keep all follow-up visits as told by your doctor. This is important.  Contact a doctor if:  You have pain that gets worse.  You have pain that does not get better with medicine.  You have pain during sex.  You feel sick to your stomach or you throw up during your period, and medicine does not help.  Get help right away if:  You pass out (faint).  Summary  Dysmenorrhea means painful cramps during your period (menstrual period).  Put heat on your lower back or belly when you have pain or cramps.  Do exercises like walking, swimming, or biking to  help with cramps.  Contact a doctor if you have pain during sex.  This information is not intended to replace advice given to you by your health care provider. Make sure you discuss any questions you have with your health care provider.  Document Released: 06/18/2008 Document Revised: 04/08/2016 Document Reviewed: 04/08/2016  Elsevier Interactive Patient Education  2019 [employer]

## 2018-04-12 NOTE — Progress Notes (Signed)
Pt had tubal, states the cramping with periods are becoming worse and worse x2 months ago on right side, cant get out of  bed and hurts when she pees, pt states its unbearable at times, Dull pain on top of stomach, lack of appetite, nausea, attempted pain meds and nothing has helped.  Periods last 6-7 days.

## 2018-04-12 NOTE — Progress Notes (Signed)
History:  [name] is a 28 y.o. [phone] who presents to clinic today with complaint of dysmenorrhea worsening x 2 months. The patient states that since her BTL 07/2016 she has had increased cramping with her periods. Initially she took OTC medications and they helped but the last few months she has had no relief. She has trouble with her ADLs during her periods. She has nausea with pain and has noted heavier bleeding and blood clots. She has tried Ibuprofen, Tylenol, Midol and Naproxen without relief. She states periods are regular and last 6-7 days per cycle. She states 4 days are heavy with increased pain, but pain is present throughout the cycle.    The following portions of the patient's history were reviewed and updated as appropriate: allergies, current medications, family history, past medical history, social history, past surgical history and problem list.  Review of Systems:  Review of Systems  Constitutional: Negative for fever and malaise/fatigue.  Gastrointestinal: Positive for abdominal pain and nausea. Negative for constipation, diarrhea and vomiting.  Genitourinary: Negative for dysuria, frequency and urgency.       + vaginal bleeding      Objective:  Physical Exam Physical Exam  Nursing note and vitals reviewed. Constitutional: She is oriented to person, place, and time. She appears well-developed and well-nourished. No distress.  HENT:  Head: Normocephalic and atraumatic.  Cardiovascular: Normal rate.  Respiratory: Effort normal.  GI: Soft. She exhibits no distension and no mass. There is no abdominal tenderness. There is no rebound and no guarding.  Neurological: She is alert and oriented to person, place, and time.  Skin: Skin is warm and dry. No erythema.  Psychiatric: She has a normal mood and affect.     Assessment & Plan:  1. Dysmenorrhea - US PELVIC COMPLETE WITH TRANSVAGINAL; scheduled - oxyCODONE-acetaminophen (PERCOCET/ROXICET) 5-325 MG tablet;  Take 1 tablet by mouth every 4 (four) hours as needed for severe pain.  Dispense: 15 tablet; Refill: 0  2. Flu vaccine need - Flu Vaccine QUAD 36+ mos IM (Fluarix, Quad PF)  Patient to return to [name] in 3-4 weeks for follow-up after [place] and discuss results and management If [place] is normal, may consider LARC for bleeding control to reduce likelihood of dysmenorrhea  Luvenia Redden, PA-C 04/12/2018 11:58 AM

## 2018-04-25 ENCOUNTER — Telehealth: Payer: Self-pay | Admitting: Emergency Medicine

## 2018-04-25 NOTE — Telephone Encounter (Signed)
Patient called in to the front office with a relay interpreter reporting itching related to her prescription for percocet. Pt reports having the medication before with no issues. Pt started taking the medication on 1/19. Pt was advised that benadryl would relieve the itching she was experiencing. Pt was also advised to stop taking the percocet at this time and the prescribing provider would be notified. Pt verbalized understanding and reported being on mychart.

## 2018-04-26 ENCOUNTER — Telehealth: Payer: Self-pay | Admitting: General Practice

## 2018-04-26 ENCOUNTER — Ambulatory Visit (HOSPITAL_COMMUNITY): Admission: RE | Admit: 2018-04-26 | Payer: Medicaid Other | Source: Ambulatory Visit

## 2018-04-26 DIAGNOSIS — N946 Dysmenorrhea, unspecified: Secondary | ICD-10-CM

## 2018-04-26 MED ORDER — IBUPROFEN 800 MG PO TABS: 800.0000 mg | ORAL_TABLET | Freq: Three times a day (TID) | ORAL | 0 refills | Status: DC | PRN

## 2018-04-26 NOTE — Telephone Encounter (Signed)
Patient called into front office stating she is still waiting to hear back about the pain medication. Per Kerry Hough, patient can have ibuprofen 800mg . Patient informed. Patient verbalized understanding & had no questions.

## 2018-05-01 DIAGNOSIS — H905 Unspecified sensorineural hearing loss: Secondary | ICD-10-CM | POA: Diagnosis not present

## 2018-05-01 DIAGNOSIS — M792 Neuralgia and neuritis, unspecified: Secondary | ICD-10-CM | POA: Diagnosis not present

## 2018-05-01 DIAGNOSIS — S8991XA Unspecified injury of right lower leg, initial encounter: Secondary | ICD-10-CM | POA: Diagnosis not present

## 2018-05-01 DIAGNOSIS — M25561 Pain in right knee: Secondary | ICD-10-CM | POA: Diagnosis not present

## 2018-05-08 DIAGNOSIS — M792 Neuralgia and neuritis, unspecified: Secondary | ICD-10-CM | POA: Diagnosis not present

## 2018-05-08 DIAGNOSIS — S8391XA Sprain of unspecified site of right knee, initial encounter: Secondary | ICD-10-CM | POA: Diagnosis not present

## 2018-05-08 DIAGNOSIS — M25561 Pain in right knee: Secondary | ICD-10-CM | POA: Diagnosis not present

## 2018-05-10 ENCOUNTER — Ambulatory Visit: Payer: Self-pay | Admitting: Family Medicine

## 2018-05-23 ENCOUNTER — Ambulatory Visit: Payer: Self-pay | Admitting: Family Medicine

## 2018-05-24 ENCOUNTER — Ambulatory Visit: Payer: Self-pay | Admitting: Obstetrics & Gynecology

## 2019-03-19 ENCOUNTER — Encounter (HOSPITAL_COMMUNITY): Payer: Self-pay | Admitting: Emergency Medicine

## 2019-03-19 ENCOUNTER — Other Ambulatory Visit: Payer: Self-pay

## 2019-03-19 ENCOUNTER — Emergency Department (HOSPITAL_COMMUNITY)
Admission: EM | Admit: 2019-03-19 | Discharge: 2019-03-19 | Disposition: A | Discharge status: Home / Self Care | Payer: Medicaid Other | Attending: Emergency Medicine | Admitting: Emergency Medicine

## 2019-03-19 ENCOUNTER — Emergency Department (HOSPITAL_COMMUNITY): Payer: Medicaid Other

## 2019-03-19 DIAGNOSIS — K5900 Constipation, unspecified: Secondary | ICD-10-CM | POA: Diagnosis not present

## 2019-03-19 DIAGNOSIS — R1032 Left lower quadrant pain: Secondary | ICD-10-CM | POA: Diagnosis present

## 2019-03-19 DIAGNOSIS — R109 Unspecified abdominal pain: Secondary | ICD-10-CM | POA: Diagnosis not present

## 2019-03-19 DIAGNOSIS — R1084 Generalized abdominal pain: Secondary | ICD-10-CM | POA: Diagnosis not present

## 2019-03-19 LAB — COMPREHENSIVE METABOLIC PANEL
ALT: 18 U/L (ref 0–44)
AST: 18 U/L (ref 15–41)
Albumin: 4.1 g/dL (ref 3.5–5.0)
Alkaline Phosphatase: 63 U/L (ref 38–126)
Anion gap: 9 (ref 5–15)
BUN: 10 mg/dL (ref 6–20)
CO2: 27 mmol/L (ref 22–32)
Calcium: 9.4 mg/dL (ref 8.9–10.3)
Chloride: 103 mmol/L (ref 98–111)
Creatinine, Ser: 0.63 mg/dL (ref 0.44–1.00)
GFR calc Af Amer: >60 mL/min (ref >60)
GFR calc non Af Amer: >60 mL/min (ref >60)
Glucose, Bld: 85 mg/dL (ref 70–99)
Potassium: 4.6 mmol/L (ref 3.5–5.1)
Sodium: 139 mmol/L (ref 135–145)
Total Bilirubin: 0.4 mg/dL (ref 0.3–1.2)
Total Protein: 7.7 g/dL (ref 6.5–8.1)

## 2019-03-19 LAB — CBC
HCT: 41.1 % (ref 36.0–46.0)
Hemoglobin: 13.3 g/dL (ref 12.0–15.0)
MCH: 30.2 pg (ref 26.0–34.0)
MCHC: 32.4 g/dL (ref 30.0–36.0)
MCV: 93.2 fL (ref 80.0–100.0)
Platelets: 274 10*3/uL (ref 150–400)
RBC: 4.41 MIL/uL (ref 3.87–5.11)
RDW: 12.4 % (ref 11.5–15.5)
WBC: 7.4 10*3/uL (ref 4.0–10.5)
nRBC: 0 % (ref 0.0–0.2)

## 2019-03-19 LAB — URINALYSIS, ROUTINE W REFLEX MICROSCOPIC
Bacteria, UA: NONE SEEN
Bilirubin Urine: NEGATIVE
Glucose, UA: NEGATIVE mg/dL
Hgb urine dipstick: NEGATIVE
Ketones, ur: NEGATIVE mg/dL
Nitrite: NEGATIVE
Protein, ur: NEGATIVE mg/dL
Specific Gravity, Urine: 1.023 (ref 1.005–1.030)
pH: 5 (ref 5.0–8.0)

## 2019-03-19 LAB — I-STAT BETA HCG BLOOD, ED (MC, WL, AP ONLY): I-stat hCG, quantitative: <5 m[IU]/mL (ref <5)

## 2019-03-19 LAB — LIPASE, BLOOD: Lipase: 32 U/L (ref 11–51)

## 2019-03-19 MED ORDER — SODIUM CHLORIDE 0.9% FLUSH
3.0000 mL | Freq: Once | INTRAVENOUS | Status: AC
  Administered 2019-03-19: 3 mL via INTRAVENOUS

## 2019-03-19 MED ORDER — SODIUM CHLORIDE (PF) 0.9 % IJ SOLN
INTRAMUSCULAR | Status: AC
  Filled 2019-03-19: qty 50

## 2019-03-19 MED ORDER — IOHEXOL 300 MG/ML  SOLN
100.0000 mL | Freq: Once | INTRAMUSCULAR | Status: AC | PRN
  Administered 2019-03-19: 100 mL via INTRAVENOUS

## 2019-03-19 MED ORDER — IBUPROFEN 800 MG PO TABS: 800.0000 mg | ORAL_TABLET | Freq: Three times a day (TID) | ORAL | 0 refills | Status: DC | PRN

## 2019-03-19 NOTE — Discharge Instructions (Addendum)
Return if any problems.  See your Physician for recheck.  Take miralax for the next 2 weeks.  You can try Mag citrate x one dose

## 2019-03-19 NOTE — ED Triage Notes (Signed)
Lower abdominal pain over past two weeks with some nausea; worse when getting up and walking. Worsening specifically over past 2 days. Pt is deaf.

## 2019-03-19 NOTE — ED Provider Notes (Signed)
[facility] DEPT Provider Note   CSN: [id number] Arrival date & time: 03/19/19  1455     History Chief Complaint  Patient presents with  . Abdominal Pain    Diana Torres is a 28 y.o. female.  The history is provided by the patient. No language interpreter was used.  Abdominal Pain Pain location:  LLQ Pain quality: aching   Pain radiates to:  Does not radiate Pain severity:  Moderate Onset quality:  Unable to specify Duration:  2 weeks Timing:  Constant Progression:  Worsening Chronicity:  New Context: not recent illness and not sick contacts   Relieved by:  Nothing Worsened by:  Nothing Ineffective treatments:  None tried Associated symptoms: no diarrhea, no fever, no nausea and no vomiting   Risk factors: not pregnant        Past Medical History:  Diagnosis Date  . Deaf   . History of broken nose   . 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 Mass    cyst/left upper abd quad  . Miscarriage    pt states had difficulty with high BP prior to miscarriage and afterwards till infection was healed  . PONV (postoperative nausea and vomiting)   . UTI (lower urinary tract infection)     Patient Active Problem List   Diagnosis Date Noted  . Encounter for sterilization 07/08/2016  . Hearing impaired 08/14/2012    Past Surgical History:  Procedure Laterality Date  . COCHLEAR IMPLANT    . 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 LAPAROSCOPIC TUBAL LIGATION Bilateral 07/08/2016   Procedure: LAPAROSCOPIC TUBAL LIGATION WITH FILSHIE CLIPS;  Surgeon: Osborne Oman, MD;  Location: [facility];  Service: Gynecology;  Laterality: Bilateral;  . RHINOPLASTY    . right foot  2016   cyst removed  . TONSILLECTOMY       OB History    Gravida  3   Para  2   Term  2   Preterm      AB  1   Living  2     SAB  1   TAB      Ectopic      Multiple  0   Live Births  2           Family History  Problem Relation Age of Onset  . Hearing loss Mother   . Hypertension Mother   . Alcohol abuse Father   . Hearing loss Father   . Pancreatic cancer Maternal Grandmother     Social History   Tobacco Use  . Smoking status: Never Smoker  . Smokeless tobacco: Never Used  Substance Use Topics  . Alcohol use: No  . Drug use: No    Home Medications Prior to Admission medications   Medication Sig Start Date End Date Taking? Authorizing Provider  ibuprofen (ADVIL) 800 MG tablet Take 1 tablet (800 mg total) by mouth every 8 (eight) hours as needed. 03/19/19   Fransico Meadow, PA-C  oxyCODONE-acetaminophen (PERCOCET/ROXICET) 5-325 MG tablet Take 1 tablet by mouth every 4 (four) hours as needed for severe pain. 04/12/18   Luvenia Redden, PA-C  hyoscyamine (LEVSIN SL) 0.125 MG SL tablet Place 1 tablet (0.125 mg total) under the tongue every 6 (six) hours as needed. Patient not taking: Reported on 04/12/2018 08/08/17 03/19/19  Wilfrid Lund  L III, MD  methscopolamine (PAMINE) 2.5 MG TABS tablet Take 1 tablet (2.5 mg total) by mouth 2 (two) times daily as needed. Patient not taking: Reported on 04/12/2018 09/23/17 03/19/19  Doran Stabler, MD    Allergies    Codeine  Review of Systems   Review of Systems  Constitutional: Negative for fever.  Gastrointestinal: Positive for abdominal pain. Negative for diarrhea, nausea and vomiting.  All other systems reviewed and are negative.   Physical Exam Updated Vital Signs BP (!) 138/102   Pulse 85   Temp 98 F (36.7 C) (Oral)   Resp 18   Ht 5\' 3"  (1.6 m)   Wt 77.1 kg   LMP 03/12/2019   SpO2 100%   BMI 30.11 kg/m   Physical Exam Vitals and nursing note reviewed.  Constitutional:      Appearance: She is well-developed.  HENT:     Head: Normocephalic.  Cardiovascular:     Rate and Rhythm: Normal rate.  Pulmonary:      Effort: Pulmonary effort is normal.  Abdominal:     General: Abdomen is flat. Bowel sounds are normal. There is no distension.  Musculoskeletal:        General: Normal range of motion.     Cervical back: Normal range of motion.  Skin:    General: Skin is warm.  Neurological:     General: No focal deficit present.     Mental Status: She is alert and oriented to person, place, and time.     ED Results / Procedures / Treatments   Labs (all labs ordered are listed, but only abnormal results are displayed) Labs Reviewed  URINALYSIS, ROUTINE W REFLEX MICROSCOPIC - Abnormal; Notable for the following components:      Result Value   Leukocytes,Ua TRACE (*)    All other components within normal limits  LIPASE, BLOOD  COMPREHENSIVE METABOLIC PANEL  CBC  I-STAT BETA HCG BLOOD, ED ([facility], [facility], [facility] ONLY)    EKG None  Radiology CT ABDOMEN PELVIS W CONTRAST  Result Date: 03/19/2019 CLINICAL DATA:  Abdominal pain with nausea EXAM: CT ABDOMEN AND PELVIS WITH CONTRAST TECHNIQUE: Multidetector CT imaging of the abdomen and pelvis was performed using the standard protocol following bolus administration of intravenous contrast. CONTRAST:  154mL OMNIPAQUE IOHEXOL 300 MG/ML  SOLN COMPARISON:  June 24, 2017. FINDINGS: Lower chest: Lung bases are clear. Hepatobiliary: No focal liver lesions are evident. The gallbladder wall is not appreciably thickened. There is no biliary duct dilatation. Pancreas: No pancreatic mass or inflammatory focus. Spleen: No splenic lesions are evident. Adrenals/Urinary Tract: Adrenals bilaterally appear normal. There is no evident renal mass or hydronephrosis on either side. There is no evident renal or ureteral calculus on either side. Urinary bladder is midline with wall thickness within normal limits. Stomach/Bowel: There is no appreciable bowel wall or mesenteric thickening. There is moderate stool in the colon. There is no evident bowel obstruction. The terminal ileum  appears normal. There is no evident free air or portal venous air. Vascular/Lymphatic: No abdominal aortic aneurysm. No vascular lesions are demonstrable. There is no adenopathy in the abdomen or pelvis. Reproductive: The uterus is retroverted. There are follicles in each ovary. No pelvic masses are evident beyond follicles. There are tubal ligation clips. There is a tubal ligation clip at the left fallopian tube. The other clip is posterior to the uterus slightly to the left of midline. Other: Appendix appears normal. No evident abscess or ascites in the abdomen  or pelvis. Musculoskeletal: No blastic or lytic bone lesions. No intramuscular or abdominal wall lesions are evident. IMPRESSION: 1. There is a tubal ligation clip slightly to the left of midline posteriorly. Question displaced clip. Left-sided clip is at the level of the left fallopian tube. 2. No renal or ureteral calculus. No hydronephrosis on either side. Urinary bladder wall thickness normal. 3. No bowel obstruction. No abscess in the abdomen or pelvis. Appendix appears normal. Electronically Signed   By: Lowella Grip III M.D.   On: 03/19/2019 18:46    Procedures Procedures (including critical care time)  Medications Ordered in ED Medications  sodium chloride (PF) 0.9 % injection (has no administration in time range)  sodium chloride flush (NS) 0.9 % injection 3 mL (3 mLs Intravenous Given 03/19/19 1741)  iohexol (OMNIPAQUE) 300 MG/ML solution 100 mL (100 mLs Intravenous Contrast Given 03/19/19 1830)    ED Course  I have reviewed the triage vital signs and the nursing notes.  Pertinent labs & imaging results that were available during my care of the patient were reviewed by me and considered in my medical decision making (see chart for details).    MDM Rules/Calculators/A&P                      MDM  I suspect pt has some constipation.  I advised her to try miralax.  Pt advised to follow up with her gynecologist for evaluation  of abnormal position of tubal clip.  I do not think pt pain is related to this.   Final Clinical Impression(s) / ED Diagnoses Final diagnoses:  Generalized abdominal pain  Constipation, unspecified constipation type    Rx / DC Orders ED Discharge Orders         Ordered    ibuprofen (ADVIL) 800 MG tablet  Every 8 hours PRN     03/19/19 2007        An After Visit Summary was printed and given to the patient.    Sidney Ace 03/19/19 2018    Valarie Merino, MD 03/19/19 [phone]

## 2019-03-19 NOTE — ED Notes (Signed)
Pt refused discharge vital signs

## 2019-03-20 ENCOUNTER — Ambulatory Visit (INDEPENDENT_AMBULATORY_CARE_PROVIDER_SITE_OTHER): Payer: Medicaid Other | Admitting: Family Medicine

## 2019-03-20 ENCOUNTER — Encounter: Payer: Self-pay | Admitting: Family Medicine

## 2019-03-20 ENCOUNTER — Telehealth: Payer: Self-pay | Admitting: Obstetrics & Gynecology

## 2019-03-20 ENCOUNTER — Other Ambulatory Visit: Payer: Self-pay

## 2019-03-20 ENCOUNTER — Other Ambulatory Visit (HOSPITAL_COMMUNITY)
Admission: RE | Admit: 2019-03-20 | Discharge: 2019-03-20 | Disposition: A | Discharge status: Home / Self Care | Payer: Medicaid Other | Source: Ambulatory Visit | Attending: Family Medicine | Admitting: Family Medicine

## 2019-03-20 VITALS — BP 125/94 | HR 77 | Wt 165.1 lb

## 2019-03-20 DIAGNOSIS — R102 Pelvic and perineal pain: Secondary | ICD-10-CM

## 2019-03-20 MED ORDER — DOXYCYCLINE HYCLATE 100 MG PO CAPS: 100.0000 mg | ORAL_CAPSULE | Freq: Two times a day (BID) | ORAL | 0 refills | Status: DC

## 2019-03-20 MED ORDER — CYCLOBENZAPRINE HCL 10 MG PO TABS: 10.0000 mg | ORAL_TABLET | Freq: Three times a day (TID) | ORAL | 1 refills | Status: DC | PRN

## 2019-03-20 NOTE — Telephone Encounter (Signed)
Call returned to pt @ 1110 using relay sign language interpreter. Pt states she did take some Miralax as recommended by ED and had some results. She is still having a lot of lower abdominal pain. She was offered same Diana Torres appointment today @ 1435 and accepted. Pt was advised that we will try to arrange for sign language interpreter however on short notice this may not be possible. Pt voiced understanding.

## 2019-03-20 NOTE — Patient Instructions (Signed)
Pelvic Pain, Female Pelvic pain is pain in your lower belly (abdomen), below your belly button and between your hips. The pain may start suddenly (be acute), keep coming back (be recurring), or last a long time (become chronic). Pelvic pain that lasts longer than 6 months is called chronic pelvic pain. There are many causes of pelvic pain. Sometimes the cause of pelvic pain is not known. Follow these instructions at home:   Take over-the-counter and prescription medicines only as told by your doctor.  Rest as told by your doctor.  Do not have sex if it hurts.  Keep a journal of your pelvic pain. Write down: ? When the pain started. ? Where the pain is located. ? What seems to make the pain better or worse, such as food or your period (menstrual cycle). ? Any symptoms you have along with the pain.  Keep all follow-up visits as told by your doctor. This is important. Contact a doctor if:  Medicine does not help your pain.  Your pain comes back.  You have new symptoms.  You have unusual discharge or bleeding from your vagina.  You have a fever or chills.  You are having trouble pooping (constipation).  You have blood in your pee (urine) or poop (stool).  Your pee smells bad.  You feel weak or light-headed. Get help right away if:  You have sudden pain that is very bad.  Your pain keeps getting worse.  You have very bad pain and also have any of these symptoms: ? A fever. ? Feeling sick to your stomach (nausea). ? Throwing up (vomiting). ? Being very sweaty.  You pass out (lose consciousness). Summary  Pelvic pain is pain in your lower belly (abdomen), below your belly button and between your hips.  There are many possible causes of pelvic pain.  Keep a journal of your pelvic pain. This information is not intended to replace advice given to you by your health care provider. Make sure you discuss any questions you have with your health care provider. Document  Released: 09/08/2007 Document Revised: 09/07/2017 Document Reviewed: 09/07/2017 Elsevier Patient Education  2020 [employer].

## 2019-03-20 NOTE — Progress Notes (Signed)
   Subjective:    Patient ID: Diana Torres is a 28 y.o. female presenting with No chief complaint on file.  on 03/20/2019  HPI: Seen in ED yesterday with LLQ pain. Noted on CT to have some movement of her filshie clip. Otherwise normal pelvic anatomy. The pain started 2 wks ago and then came and went and then 3 days ago and noted just left sided and pain and pain got worse. Tossing and turning at night and felt worse with movement. Has tried motrin and tylenol and none of that helped. Got a prescription pain med which did not help at all. CT was negative.    Review of Systems  Constitutional: Negative for chills and fever.  Respiratory: Negative for shortness of breath.   Cardiovascular: Negative for chest pain.  Gastrointestinal: Positive for abdominal pain. Negative for constipation, diarrhea, nausea and vomiting.  Genitourinary: Negative for dysuria, vaginal bleeding and vaginal discharge.  Skin: Negative for rash.      Objective:    BP (!) 125/94   Pulse 77   Wt 165 lb 1.6 oz (74.9 kg)   LMP 03/06/2019 (Exact Date)   BMI 29.25 kg/m  Physical Exam Exam conducted with a chaperone present.  Constitutional:      General: She is not in acute distress.    Appearance: She is well-developed.  HENT:     Head: Normocephalic and atraumatic.  Eyes:     General: No scleral icterus. Cardiovascular:     Rate and Rhythm: Normal rate.  Pulmonary:     Effort: Pulmonary effort is normal.  Abdominal:     Palpations: Abdomen is soft.  Genitourinary:    Cervix: Cervical motion tenderness present.     Adnexa:        Right: Tenderness present.        Left: Tenderness and fullness present.   Musculoskeletal:     Cervical back: Neck supple.  Skin:    General: Skin is warm and dry.  Neurological:     Mental Status: She is alert and oriented to person, place, and time.         Assessment & Plan:   Problem List Items Addressed This Visit      Unprioritized   Pelvic pain -  Primary    Unclear etiology. Will check pelvic u/s though CT is essentially negative. Do not think her Filshie clips are the issue. Given CMT and tenderness, will treat presumptively for PID or endometritis. Add flexeril for pain. Has narcotic pain meds from the ED.      Relevant Medications   cyclobenzaprine (FLEXERIL) 10 MG tablet   doxycycline (VIBRAMYCIN) 100 MG capsule   Other Relevant Orders   Cytology - PAP( [facility])      Total face-to-face time with patient: 25 minutes. Over 50% of encounter was spent on counseling and coordination of care. Return in about 4 weeks (around 04/17/2019) for a follow-up, needs U/S scheduled ASAP.  Donnamae Jude 03/21/2019 10:29 AM

## 2019-03-20 NOTE — Telephone Encounter (Signed)
Received a call from the patient about the visit she had at the ED on yesterday. She states she is in so much pain she can not work, and needs to see the first provider as soon as possible.

## 2019-03-20 NOTE — Progress Notes (Signed)
Pain began 2 weeks ago before period began on 03/06/19. Pain became worse 3 days ago, pt went to ED. Pain today 7-8/10; worse at night. No relief with Tylenol, Percocet, or ibuprofen.   Apolonio Schneiders RN 03/20/19

## 2019-03-21 ENCOUNTER — Encounter: Payer: Self-pay | Admitting: Family Medicine

## 2019-03-21 DIAGNOSIS — R102 Pelvic and perineal pain: Secondary | ICD-10-CM | POA: Insufficient documentation

## 2019-03-21 NOTE — Assessment & Plan Note (Signed)
Unclear etiology. Will check pelvic u/s though CT is essentially negative. Do not think her Filshie clips are the issue. Given CMT and tenderness, will treat presumptively for PID or endometritis. Add flexeril for pain. Has narcotic pain meds from the ED.

## 2019-03-22 LAB — CYTOLOGY - PAP
Chlamydia: NEGATIVE
Comment: NEGATIVE
Comment: NEGATIVE
Comment: NEGATIVE
Comment: NORMAL
Diagnosis: NEGATIVE
High risk HPV: NEGATIVE
Neisseria Gonorrhea: NEGATIVE
Trichomonas: NEGATIVE

## 2019-03-28 ENCOUNTER — Ambulatory Visit (HOSPITAL_COMMUNITY)
Admission: RE | Admit: 2019-03-28 | Discharge: 2019-03-28 | Disposition: A | Discharge status: Home / Self Care | Payer: Medicaid Other | Source: Ambulatory Visit | Attending: Medical | Admitting: Medical

## 2019-03-28 ENCOUNTER — Other Ambulatory Visit: Payer: Self-pay

## 2019-03-28 DIAGNOSIS — N946 Dysmenorrhea, unspecified: Secondary | ICD-10-CM | POA: Diagnosis not present

## 2019-04-12 ENCOUNTER — Ambulatory Visit: Payer: Medicaid Other | Attending: Internal Medicine

## 2019-04-12 DIAGNOSIS — Z20822 Contact with and (suspected) exposure to covid-19: Secondary | ICD-10-CM | POA: Diagnosis not present

## 2019-04-14 LAB — NOVEL CORONAVIRUS, NAA: SARS-CoV-2, NAA: NOT DETECTED

## 2019-04-26 ENCOUNTER — Ambulatory Visit: Payer: Medicaid Other | Admitting: Family Medicine

## 2019-04-30 ENCOUNTER — Encounter (HOSPITAL_COMMUNITY): Payer: Self-pay

## 2019-04-30 ENCOUNTER — Emergency Department (HOSPITAL_COMMUNITY)
Admission: EM | Admit: 2019-04-30 | Discharge: 2019-05-01 | Disposition: A | Discharge status: Other Acute Inpatient Hospital | Payer: BLUE CROSS/BLUE SHIELD | Attending: Emergency Medicine | Admitting: Emergency Medicine

## 2019-04-30 ENCOUNTER — Other Ambulatory Visit: Payer: Self-pay

## 2019-04-30 DIAGNOSIS — Z20822 Contact with and (suspected) exposure to covid-19: Secondary | ICD-10-CM | POA: Insufficient documentation

## 2019-04-30 DIAGNOSIS — R45851 Suicidal ideations: Secondary | ICD-10-CM | POA: Diagnosis not present

## 2019-04-30 DIAGNOSIS — F332 Major depressive disorder, recurrent severe without psychotic features: Secondary | ICD-10-CM | POA: Insufficient documentation

## 2019-04-30 DIAGNOSIS — Z008 Encounter for other general examination: Secondary | ICD-10-CM | POA: Insufficient documentation

## 2019-04-30 DIAGNOSIS — F329 Major depressive disorder, single episode, unspecified: Secondary | ICD-10-CM

## 2019-04-30 DIAGNOSIS — Z79899 Other long term (current) drug therapy: Secondary | ICD-10-CM | POA: Insufficient documentation

## 2019-04-30 DIAGNOSIS — F32A Depression, unspecified: Secondary | ICD-10-CM

## 2019-04-30 DIAGNOSIS — Z03818 Encounter for observation for suspected exposure to other biological agents ruled out: Secondary | ICD-10-CM | POA: Diagnosis not present

## 2019-04-30 LAB — CBC WITH DIFFERENTIAL/PLATELET
Abs Immature Granulocytes: 0.02 10*3/uL (ref 0.00–0.07)
Basophils Absolute: 0 10*3/uL (ref 0.0–0.1)
Basophils Relative: 1 %
Eosinophils Absolute: 0.2 10*3/uL (ref 0.0–0.5)
Eosinophils Relative: 2 %
HCT: 40.8 % (ref 36.0–46.0)
Hemoglobin: 13.4 g/dL (ref 12.0–15.0)
Immature Granulocytes: 0 %
Lymphocytes Relative: 27 %
Lymphs Abs: 2 10*3/uL (ref 0.7–4.0)
MCH: 29.8 pg (ref 26.0–34.0)
MCHC: 32.8 g/dL (ref 30.0–36.0)
MCV: 90.7 fL (ref 80.0–100.0)
Monocytes Absolute: 0.4 10*3/uL (ref 0.1–1.0)
Monocytes Relative: 6 %
Neutro Abs: 4.8 10*3/uL (ref 1.7–7.7)
Neutrophils Relative %: 64 %
Platelets: 280 10*3/uL (ref 150–400)
RBC: 4.5 MIL/uL (ref 3.87–5.11)
RDW: 12.5 % (ref 11.5–15.5)
WBC: 7.5 10*3/uL (ref 4.0–10.5)
nRBC: 0 % (ref 0.0–0.2)

## 2019-04-30 LAB — COMPREHENSIVE METABOLIC PANEL
ALT: 16 U/L (ref 0–44)
AST: 15 U/L (ref 15–41)
Albumin: 4.6 g/dL (ref 3.5–5.0)
Alkaline Phosphatase: 51 U/L (ref 38–126)
Anion gap: 8 (ref 5–15)
BUN: 8 mg/dL (ref 6–20)
CO2: 26 mmol/L (ref 22–32)
Calcium: 9.6 mg/dL (ref 8.9–10.3)
Chloride: 105 mmol/L (ref 98–111)
Creatinine, Ser: 0.64 mg/dL (ref 0.44–1.00)
GFR calc Af Amer: >60 mL/min (ref >60)
GFR calc non Af Amer: >60 mL/min (ref >60)
Glucose, Bld: 93 mg/dL (ref 70–99)
Potassium: 4.1 mmol/L (ref 3.5–5.1)
Sodium: 139 mmol/L (ref 135–145)
Total Bilirubin: 0.5 mg/dL (ref 0.3–1.2)
Total Protein: 8 g/dL (ref 6.5–8.1)

## 2019-04-30 LAB — RAPID URINE DRUG SCREEN, HOSP PERFORMED
Amphetamines: NOT DETECTED
Barbiturates: NOT DETECTED
Benzodiazepines: NOT DETECTED
Cocaine: NOT DETECTED
Opiates: NOT DETECTED
Tetrahydrocannabinol: POSITIVE — AB

## 2019-04-30 LAB — RESPIRATORY PANEL BY RT PCR (FLU A&B, COVID)
Influenza A by PCR: NEGATIVE
Influenza B by PCR: NEGATIVE
SARS Coronavirus 2 by RT PCR: NEGATIVE

## 2019-04-30 LAB — I-STAT BETA HCG BLOOD, ED (MC, WL, AP ONLY): I-stat hCG, quantitative: <5 m[IU]/mL (ref <5)

## 2019-04-30 LAB — ETHANOL: Alcohol, Ethyl (B): <10 mg/dL (ref <10)

## 2019-04-30 MED ORDER — ACETAMINOPHEN 325 MG PO TABS: 650.0000 mg | ORAL_TABLET | ORAL | Status: DC | PRN

## 2019-04-30 MED ORDER — HYDROXYZINE HCL 25 MG PO TABS
25.0000 mg | ORAL_TABLET | Freq: Once | ORAL | Status: AC
  Administered 2019-04-30: 25 mg via ORAL
  Filled 2019-04-30: qty 1

## 2019-04-30 NOTE — [facility] Assessment (Signed)
[facility] Assessment Progress Note   Case was staffed with [place] NP who recommended a inpatient admission to assist with stabilization.    

## 2019-04-30 NOTE — ED Notes (Addendum)
While translator was here patient was dressed out in purple scrubs.  She contracted for safety.  All belongings were locked in locker 26.  Also translator stated that urine was already obtained.

## 2019-04-30 NOTE — Progress Notes (Addendum)
Patient meets inpatient criteria per Earleen Newport, NP. Patient has been faxed out to the following facilities for review:   [facility]  [facility]   [name]  CSW will continue to follow and assist with disposition planning.   Domenic Schwab, MSW, LCSW-A Clinical Disposition Social Worker [facility]/TTS [phone]

## 2019-04-30 NOTE — ED Triage Notes (Signed)
Pt arrives today stating that she has had anxiety and depression for a long time. Pt states that it has just gotten worse lately. Pt states that her body and mind are just tired. Pt states that she wants to give up and is tired of fighting. Pt states that she feels like she lost herself after having children. She states that she has had thoughts of suicide, but no plan. Pt states she feels unloved and lonely , that she's been crying without any reason.  Pt lives with her husband and 2 children. Pt states that her husband doesn't understand her anxiety and depression, and just thinks she should get over it. Pt states he doesn't understand the emotional toll on her.    AS[facility] interpreter used, Leory Plowman, [phone]

## 2019-04-30 NOTE — BH Assessment (Signed)
Assessment Note  Diana Torres is an 29 y.o. female that presents voluntary with S/I. 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 Patient reports a plan to "stab herself in the stomach." Patient denies any H/I or AVH. Patient denies any prior attempts or gestures at self harm. Patient states she was diagnosed with depression seven years ago after the birth of her child and briefly received counseling although never was prescribed any medications for symptom management. Patient denies any SA issues or history of abuse. Patient is observed to be very tearful as this Probation officer attempts to assess. Patient reports decreased sleep over the last week stating she has only been sleeping "a couple hours" every night. Patient states she currently resides with her husband and cannot identify any specific stressor stating "it's everything." Patient reports ongoing symptoms to include: feeling worthless, isolating and guilt. Patient stated she "knew she had to come to the hospital today after her child ask her why she was so sad." Patient stated soon after that she became suicidal. Per notes this date on admission.  Patient has a history of depression, who presents to the emergency department for evaluation of anxiety and depression. She states she has been dealing with this on her own for a long time, has never been on medication or sought help or counseling. She states over the past 2 months she started having feelings of giving up and that she does not want to live anymore. She reports these thoughts of suicide have become increasingly more persistent. She states she fears waking up in the morning and having to go on with the next day. She denies any HI or AVH. No attempts to harm herself prior to arrival. She denies any focal medical complaints. She states that she thinks her anxiety and depression stemmed from having two kids, after which she feels like she lost herself  and has not been the same since. She states her husband does not fully understand her anxiety and depression, and just feels like she should get on with it and move on.   This Probation officer spoke with patient's mother Carmelina Paddock who rendered collateral stating she has been concerned about her daughter for the last two weeks stating "she has not been herself." Patient's mother states she is afraid patient will self harm and feels she is in need of help. Patient is requesting a inpatient admission to assist with symptom management. Patient is oriented x 4 and presents with a depressed affect. Patient thought process is organized and patient does not appear to be responding to internal stimuli. Case was staffed with Rankin NP who recommended a inpatient admission to assist with stabilization.     Diagnosis: F33.2 MDD recurrent without psychotic features, severe   Past Medical History:  Past Medical History:  Diagnosis Date  . Deaf   . History of broken nose   . 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 Mass    cyst/left upper abd quad  . Miscarriage    pt states had difficulty with high BP prior to miscarriage and afterwards till infection was healed  . PONV (postoperative nausea and vomiting)   . UTI (lower urinary tract infection)     Past Surgical History:  Procedure Laterality Date  . COCHLEAR IMPLANT    . 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 LAPAROSCOPIC TUBAL LIGATION Bilateral 07/08/2016   Procedure: LAPAROSCOPIC TUBAL LIGATION WITH FILSHIE CLIPS;  Surgeon: Osborne Oman, MD;  Location: [place] [facility];  Service: Gynecology;  Laterality: Bilateral;  . RHINOPLASTY    . right foot  2016   cyst removed  . TONSILLECTOMY      Family History:  Family History  Problem Relation Age of Onset  . Hearing loss Mother   . Hypertension  Mother   . Alcohol abuse Father   . Hearing loss Father   . Pancreatic cancer Maternal Grandmother     Social History:  reports that she has never smoked. She has never used smokeless tobacco. She reports that she does not drink alcohol or use drugs.  Additional Social History:  Alcohol / Drug Use Pain Medications: See MAR Prescriptions: See MAR Over the Counter: See MAR History of alcohol / drug use?: No history of alcohol / drug abuse  CIWA: CIWA-Ar BP: (!) 148/103 Pulse Rate: 93 COWS:    Allergies:  Allergies  Allergen Reactions  . Codeine Itching and Rash    Home Medications: (Not in a hospital admission)   OB/GYN Status:  Patient's last menstrual period was 04/09/2019.  General Assessment Data [facility] of Assessment: [facility] TTS Assessment: In system Is this a Tele or Face-to-Face Assessment?: Face-to-Face Is this an Initial Assessment or a Re-assessment for this encounter?: Initial Assessment Patient Accompanied by:: N/A Language Other than English: No Living Arrangements: Other (Comment) What gender do you identify as?: Female Marital status: Married Pharmacist, community name: [facility]  Pregnancy Status: No Living Arrangements: Spouse/significant other Can pt return to current living arrangement?: Yes Admission Status: Voluntary Is patient capable of signing voluntary admission?: Yes Referral Source: Self/Family/Friend Insurance type: Medicaid  Medical Screening Exam ([place]) Medical Exam completed: Yes  Crisis Care Plan Living Arrangements: Spouse/significant other Legal Guardian: (NA) Name of Psychiatrist: None Name of Therapist: None  Education Status Is patient currently in school?: No Is the patient employed, unemployed or receiving disability?: Receiving disability income  Risk to self with the past 6 months Suicidal Ideation: Yes-Currently Present Has patient been a risk to self within the past 6 months prior to admission? : No Suicidal Intent:  Yes-Currently Present Has patient had any suicidal intent within the past 6 months prior to admission? : No Is patient at risk for suicide?: Yes Suicidal Plan?: Yes-Currently Present Has patient had any suicidal plan within the past 6 months prior to admission? : No Specify Current Suicidal Plan: Pt plans to stab herself in the stomach Access to Means: Yes Specify Access to Suicidal Means: Pt has sharps What has been your use of drugs/alcohol within the last 12 months?: Denies Previous Attempts/Gestures: No How many times?: 0 Other Self Harm Risks: NA Triggers for Past Attempts: Unknown Intentional Self Injurious Behavior: None Family Suicide History: No Recent stressful life event(s): Other (Comment)(Family issues) Persecutory voices/beliefs?: No Depression: Yes Depression Symptoms: Isolating, Guilt, Feeling worthless/self pity Substance abuse history and/or treatment for substance abuse?: No Suicide prevention information given to non-admitted patients: Not applicable  Risk to Others within the past 6 months Homicidal Ideation: No Does patient have any lifetime risk of violence toward others beyond the six months prior to admission? : No Thoughts of Harm to Others: No Current Homicidal Intent: No Current Homicidal Plan: No Access to Homicidal Means: No Identified Victim: NA History of harm to others?: No Assessment of Violence: None Noted Violent Behavior Description: NA Does  patient have access to weapons?: No Criminal Charges Pending?: No Does patient have a court date: No Is patient on probation?: No  Psychosis Hallucinations: None noted Delusions: None noted  Mental Status Report Appearance/Hygiene: Unremarkable Eye Contact: Fair Motor Activity: Freedom of movement Speech: (Pt uses sign language) Level of Consciousness: Quiet/awake Mood: Depressed Affect: Appropriate to circumstance Anxiety Level: Moderate Thought Processes: Coherent, Relevant Judgement:  Partial Orientation: Person, Place, Time Obsessive Compulsive Thoughts/Behaviors: None  Cognitive Functioning Concentration: Normal Memory: Recent Intact, Remote Intact Is patient IDD: No Insight: Good Impulse Control: Fair Appetite: Good Have you had any weight changes? : No Change Sleep: Decreased Total Hours of Sleep: 2 Vegetative Symptoms: None  ADLScreening [facility] Assessment Services) Patient's cognitive ability adequate to safely complete daily activities?: Yes Patient able to express need for assistance with ADLs?: Yes Independently performs ADLs?: Yes (appropriate for developmental age)  Prior Inpatient Therapy Prior Inpatient Therapy: No  Prior Outpatient Therapy Prior Outpatient Therapy: No Does patient have an ACCT team?: No Does patient have Intensive In-House Services?  : No Does patient have [facility] services? : No Does patient have P4CC services?: No  ADL Screening (condition at time of admission) Patient's cognitive ability adequate to safely complete daily activities?: Yes Is the patient deaf or have difficulty hearing?: Yes(Pt uses sign language) Does the patient have difficulty seeing, even when wearing glasses/contacts?: No Does the patient have difficulty concentrating, remembering, or making decisions?: No Patient able to express need for assistance with ADLs?: Yes Does the patient have difficulty dressing or bathing?: No Independently performs ADLs?: Yes (appropriate for developmental age) Does the patient have difficulty walking or climbing stairs?: No Weakness of Legs: None Weakness of Arms/Hands: None  Home Assistive Devices/Equipment Home Assistive Devices/Equipment: None  Therapy Consults (therapy consults require a physician order) PT Evaluation Needed: No OT Evalulation Needed: No SLP Evaluation Needed: No Abuse/Neglect Assessment (Assessment to be complete while patient is alone) Abuse/Neglect Assessment Can Be Completed: Yes Physical  Abuse: Denies Verbal Abuse: Denies Sexual Abuse: Denies Exploitation of patient/patient's resources: Denies Self-Neglect: Denies Values / Beliefs Cultural Requests During Hospitalization: None Spiritual Requests During Hospitalization: None Consults Spiritual Care Consult Needed: No Transition of Care Team Consult Needed: No Advance Directives (For Healthcare) Does Patient Have a Medical Advance Directive?: No Would patient like information on creating a medical advance directive?: No - Patient declined          Disposition: Case was staffed with Rankin NP who recommended a inpatient admission to assist with stabilization.    Disposition Initial Assessment Completed for this Encounter: Yes Disposition of Patient: Admit Type of inpatient treatment program: Adult  On Site Evaluation by:   Reviewed with Physician:    Mamie Nick 04/30/2019 4:14 PM

## 2019-04-30 NOTE — Progress Notes (Signed)
Patient requested medication to help her sleep. Paged Dr. Zenia Resides. New one-time order for hydraxyzine 25mg  PO ordered and given. Will continue to monitor.

## 2019-04-30 NOTE — ED Provider Notes (Signed)
[facility] DEPT Provider Note   CSN: [id number] Arrival date & time: 04/30/19  1053     History Chief Complaint  Patient presents with  . Depression  . Anxiety    Diana Torres is a 29 y.o. female.  Diana Torres is a 29 y.o. 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  She states she has been dealing with this on her own for a long time, has never been on medication or sought help or counseling.  She states over the past 2 months she started having feelings of giving up and that she does not want to live anymore.  She reports these thoughts of suicide have become increasingly more persistent, she denies specific plan.  She states she fears waking up in the morning and having to go on with the next day.  She denies any HI or AVH.  No attempts to harm herself prior to arrival.  She denies any focal medical complaints.  She states that she thinks her anxiety and depression stemmed from having two kids, after which she feels like she lost herself and has not been the same since.  She states her husband does not fully understand her anxiety and depression, and just feels like she should get on with it and move on.  She denies any focal medical complaints, no fever or recent illness, no Covid exposures, no chest pain, shortness of breath, abdominal pain.  Denies drugs or alcohol.  No other aggravating or alleviating factors.  ASL interpreter used to obtain history.        Past Medical History:  Diagnosis Date  . Deaf   . History of broken nose   . 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 Mass    cyst/left upper abd quad  . Miscarriage    pt states had difficulty with high BP prior to miscarriage and afterwards till infection was healed  . PONV (postoperative nausea and vomiting)   . UTI (lower urinary tract infection)      Patient Active Problem List   Diagnosis Date Noted  . Pelvic pain 03/21/2019  . Hearing impaired 08/14/2012    Past Surgical History:  Procedure Laterality Date  . COCHLEAR IMPLANT    . 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 LAPAROSCOPIC TUBAL LIGATION Bilateral 07/08/2016   Procedure: LAPAROSCOPIC TUBAL LIGATION WITH FILSHIE CLIPS;  Surgeon: Osborne Oman, MD;  Location: [facility];  Service: Gynecology;  Laterality: Bilateral;  . RHINOPLASTY    . right foot  2016   cyst removed  . TONSILLECTOMY       OB History    Gravida  3   Para  2   Term  2   Preterm      AB  1   Living  2     SAB  1   TAB      Ectopic      Multiple  0   Live Births  2           Family History  Problem Relation Age of Onset  . Hearing loss Mother   . Hypertension Mother   . Alcohol abuse Father   . Hearing loss Father   . Pancreatic cancer Maternal Grandmother  Social History   Tobacco Use  . Smoking status: Never Smoker  . Smokeless tobacco: Never Used  Substance Use Topics  . Alcohol use: No  . Drug use: No    Home Medications Prior to Admission medications   Medication Sig Start Date End Date Taking? Authorizing Provider  cyclobenzaprine (FLEXERIL) 10 MG tablet Take 1 tablet (10 mg total) by mouth every 8 (eight) hours as needed for muscle spasms. 03/20/19   Donnamae Jude, MD  doxycycline (VIBRAMYCIN) 100 MG capsule Take 1 capsule (100 mg total) by mouth 2 (two) times daily. 03/20/19   Donnamae Jude, MD  ibuprofen (ADVIL) 800 MG tablet Take 1 tablet (800 mg total) by mouth every 8 (eight) hours as needed. 03/19/19   Fransico Meadow, PA-C  oxyCODONE-acetaminophen (PERCOCET/ROXICET) 5-325 MG tablet Take 1 tablet by mouth every 4 (four) hours as needed for severe pain. 04/12/18   Luvenia Redden, PA-C  hyoscyamine  (LEVSIN SL) 0.125 MG SL tablet Place 1 tablet (0.125 mg total) under the tongue every 6 (six) hours as needed. Patient not taking: Reported on 04/12/2018 08/08/17 03/19/19  Doran Stabler, MD  methscopolamine (PAMINE) 2.5 MG TABS tablet Take 1 tablet (2.5 mg total) by mouth 2 (two) times daily as needed. Patient not taking: Reported on 04/12/2018 09/23/17 03/19/19  Doran Stabler, MD    Allergies    Codeine  Review of Systems   Review of Systems  Constitutional: Negative for chills and fever.  HENT: Negative.   Respiratory: Negative for cough and shortness of breath.   Cardiovascular: Negative for chest pain.  Gastrointestinal: Negative for abdominal pain, nausea and vomiting.  Genitourinary: Negative for dysuria and frequency.  Musculoskeletal: Negative for arthralgias and myalgias.  Skin: Negative for color change and rash.  Neurological: Negative for dizziness, syncope and light-headedness.  Psychiatric/Behavioral: Positive for dysphoric mood and suicidal ideas. Negative for hallucinations. The patient is nervous/anxious.     Physical Exam Updated Vital Signs BP (!) 148/103 (BP Location: Left Arm)   Pulse 93   Temp 98.9 F (37.2 C) (Oral)   Resp 18   SpO2 100%   Physical Exam Vitals and nursing note reviewed.  Constitutional:      General: She is not in acute distress.    Appearance: Normal appearance. She is well-developed and normal weight. She is not diaphoretic.     Comments: Intermittently tearful but well-appearing and in no distress.  HENT:     Head: Normocephalic and atraumatic.  Eyes:     General:        Right eye: No discharge.        Left eye: No discharge.     Conjunctiva/sclera: Conjunctivae normal.  Cardiovascular:     Rate and Rhythm: Normal rate and regular rhythm.     Heart sounds: Normal heart sounds.  Pulmonary:     Effort: Pulmonary effort is normal. No respiratory distress.     Breath sounds: Normal breath sounds. No wheezing or rales.      Comments: Respirations equal and unlabored, patient able to speak in full sentences, lungs clear to auscultation bilaterally Abdominal:     General: Bowel sounds are normal. There is no distension.     Palpations: Abdomen is soft. There is no mass.     Tenderness: There is no abdominal tenderness. There is no guarding.     Comments: Abdomen soft, nondistended, nontender to palpation in all quadrants without guarding or peritoneal signs  Musculoskeletal:  General: No deformity.     Cervical back: Neck supple.  Skin:    General: Skin is warm and dry.     Capillary Refill: Capillary refill takes less than 2 seconds.  Neurological:     Mental Status: She is alert.     Coordination: Coordination normal.     Comments: Unable to assess speech, patient communicates through sign language, able to follow commands Moves extremities without ataxia, coordination intact  Psychiatric:        Mood and Affect: Mood is depressed. Affect is tearful.        Behavior: Behavior normal. Behavior is cooperative.        Thought Content: Thought content includes suicidal ideation. Thought content does not include homicidal ideation. Thought content does not include suicidal plan.     ED Results / Procedures / Treatments   Labs (all labs ordered are listed, but only abnormal results are displayed) Labs Reviewed  RESPIRATORY PANEL BY RT PCR (FLU A&B, COVID)  COMPREHENSIVE METABOLIC PANEL  ETHANOL  RAPID URINE DRUG SCREEN, HOSP PERFORMED  CBC WITH DIFFERENTIAL/PLATELET  I-STAT BETA HCG BLOOD, ED ([facility], [facility], [facility] ONLY)    EKG None  Radiology No results found.  Procedures Procedures (including critical care time)  Medications Ordered in ED Medications - No data to display  ED Course  I have reviewed the triage vital signs and the nursing notes.  Pertinent labs & imaging results that were available during my care of the patient were reviewed by me and considered in my medical decision making (see  chart for details).    MDM Rules/Calculators/A&P                      29 year old female presents with worsening anxiety and depression, endorsing suicidal thoughts that have become more persistent.  She has never before sought help with this and has never been on previous medications.  She denies specific suicide plan.  No HI or AVH.  Feel patient would benefit from psychiatric evaluation.  She has no focal medical complaints today or findings on exam.  Medical screening labs and Covid swab ordered.  Patient communicates through sign language, nursing staff has contacted in person ASL interpreter to be present for a TTS consult.  Lab work is reassuring, no leukocytosis, no acute electrolyte derangements, normal renal and liver function.  Negative pregnancy.  Covid is negative.  UDS pending.  Patient is medically cleared. Awaiting TTS evaluation and recommendations for appropriate disposition.  ASL interpreter at bedside.  TTS has seen and evaluated the patient, feels that she meets inpatient criteria, awaiting placement.  Placed under ED psych hold.  Final Clinical Impression(s) / ED Diagnoses Final diagnoses:  Suicidal ideation  Depression, unspecified depression type    Rx / DC Orders ED Discharge Orders    None       Janet Berlin 04/30/19 1623    Milton Ferguson, MD 04/30/19 1654

## 2019-04-30 NOTE — Progress Notes (Signed)
Pt accepted to [facility]; [name].      Dr. Jonelle Sports will be the accepting and attending provider.   Call report to [phone].   Kiersten @ [facility] ED notified.     Pt is Voluntary.    Pt may be transported by [facility].   Pt scheduled to arrive on 05/01/2019 after 8:00am, family is allowed to come with the patient and assist with completing paperwork.   Domenic Schwab, MSW, LCSW-A Clinical Disposition Social Worker [facility]/TTS [phone]

## 2019-05-01 DIAGNOSIS — F329 Major depressive disorder, single episode, unspecified: Secondary | ICD-10-CM | POA: Diagnosis not present

## 2019-05-01 NOTE — ED Notes (Signed)
Report called to [facility] to [employer]

## 2019-05-01 NOTE — ED Notes (Signed)
Transported to [facility] by [facility]. Belongings given to driver. Pt gave her car keys to her sister who is following. Pt was calm and cooperative.

## 2019-05-01 NOTE — ED Notes (Signed)
Have tried to call [facility] several times on both numbers with no answer.

## 2019-05-01 NOTE — ED Provider Notes (Addendum)
Chart reviewed- 29 yo female deaf, interviewed with asl interpreter who was advised to hospitalize noted through provider and tts note.  Patient -'s vss, labs reviewed and normal.  Patient sleeping on evaluation, but woke up and eating breakfast. RN reports patient accepted to [facility] after 0800 and she will be calling them now.   Pattricia Boss, MD 05/01/19 [id number]    Pattricia Boss, MD 05/01/19 [id number]

## 2019-05-01 NOTE — Discharge Summary (Signed)
  Patient to transferred to [facility] for inpatient psychiatric treatment

## 2019-05-01 NOTE — ED Notes (Signed)
Pt requests that her sister follow [facility] to [facility]. Request psych team to put in transfer and [place], then pt will call the sister.  When the sister arrives this Probation officer will Research officer, political party.

## 2019-05-01 NOTE — ED Notes (Signed)
Tried to call report to [facility] and there was no answer to the page.

## 2019-05-07 ENCOUNTER — Encounter: Payer: Self-pay | Admitting: Family Medicine

## 2019-05-07 ENCOUNTER — Ambulatory Visit: Payer: Medicaid Other | Admitting: Family Medicine

## 2019-05-07 NOTE — Progress Notes (Signed)
Patient did not keep appointment today. She may call to reschedule.  

## 2019-08-24 ENCOUNTER — Other Ambulatory Visit: Payer: Medicaid Other

## 2019-11-07 DIAGNOSIS — Z20822 Contact with and (suspected) exposure to covid-19: Secondary | ICD-10-CM | POA: Diagnosis not present

## 2019-11-13 ENCOUNTER — Other Ambulatory Visit: Payer: BLUE CROSS/BLUE SHIELD

## 2020-01-19 ENCOUNTER — Emergency Department (HOSPITAL_COMMUNITY)
Admission: EM | Admit: 2020-01-19 | Discharge: 2020-01-19 | Disposition: A | Discharge status: Home / Self Care | Payer: Medicaid Other | Attending: Emergency Medicine | Admitting: Emergency Medicine

## 2020-01-19 ENCOUNTER — Other Ambulatory Visit: Payer: Self-pay

## 2020-01-19 ENCOUNTER — Encounter (HOSPITAL_COMMUNITY): Payer: Self-pay | Admitting: Emergency Medicine

## 2020-01-19 ENCOUNTER — Emergency Department (HOSPITAL_COMMUNITY): Payer: Medicaid Other

## 2020-01-19 DIAGNOSIS — R519 Headache, unspecified: Secondary | ICD-10-CM | POA: Insufficient documentation

## 2020-01-19 DIAGNOSIS — H9202 Otalgia, left ear: Secondary | ICD-10-CM | POA: Insufficient documentation

## 2020-01-19 DIAGNOSIS — R42 Dizziness and giddiness: Secondary | ICD-10-CM | POA: Diagnosis not present

## 2020-01-19 DIAGNOSIS — Z9621 Cochlear implant status: Secondary | ICD-10-CM | POA: Diagnosis not present

## 2020-01-19 DIAGNOSIS — I10 Essential (primary) hypertension: Secondary | ICD-10-CM | POA: Diagnosis not present

## 2020-01-19 LAB — CBC WITH DIFFERENTIAL/PLATELET
Abs Immature Granulocytes: 0.02 10*3/uL (ref 0.00–0.07)
Basophils Absolute: 0 10*3/uL (ref 0.0–0.1)
Basophils Relative: 0 %
Eosinophils Absolute: 0.2 10*3/uL (ref 0.0–0.5)
Eosinophils Relative: 2 %
HCT: 37.4 % (ref 36.0–46.0)
Hemoglobin: 12.2 g/dL (ref 12.0–15.0)
Immature Granulocytes: 0 %
Lymphocytes Relative: 24 %
Lymphs Abs: 2.2 10*3/uL (ref 0.7–4.0)
MCH: 29.5 pg (ref 26.0–34.0)
MCHC: 32.6 g/dL (ref 30.0–36.0)
MCV: 90.3 fL (ref 80.0–100.0)
Monocytes Absolute: 0.5 10*3/uL (ref 0.1–1.0)
Monocytes Relative: 6 %
Neutro Abs: 6 10*3/uL (ref 1.7–7.7)
Neutrophils Relative %: 68 %
Platelets: 285 10*3/uL (ref 150–400)
RBC: 4.14 MIL/uL (ref 3.87–5.11)
RDW: 12.9 % (ref 11.5–15.5)
WBC: 8.9 10*3/uL (ref 4.0–10.5)
nRBC: 0 % (ref 0.0–0.2)

## 2020-01-19 LAB — BASIC METABOLIC PANEL
Anion gap: 8 (ref 5–15)
BUN: 8 mg/dL (ref 6–20)
CO2: 25 mmol/L (ref 22–32)
Calcium: 9.1 mg/dL (ref 8.9–10.3)
Chloride: 106 mmol/L (ref 98–111)
Creatinine, Ser: 0.62 mg/dL (ref 0.44–1.00)
GFR, Estimated: >60 mL/min (ref >60)
Glucose, Bld: 90 mg/dL (ref 70–99)
Potassium: 3.9 mmol/L (ref 3.5–5.1)
Sodium: 139 mmol/L (ref 135–145)

## 2020-01-19 MED ORDER — PROCHLORPERAZINE EDISYLATE 10 MG/2ML IJ SOLN
10.0000 mg | Freq: Once | INTRAMUSCULAR | Status: AC
  Administered 2020-01-19: 10 mg via INTRAVENOUS
  Filled 2020-01-19: qty 2

## 2020-01-19 MED ORDER — KETOROLAC TROMETHAMINE 30 MG/ML IJ SOLN
30.0000 mg | Freq: Once | INTRAMUSCULAR | Status: AC
  Administered 2020-01-19: 30 mg via INTRAVENOUS
  Filled 2020-01-19: qty 1

## 2020-01-19 MED ORDER — DIPHENHYDRAMINE HCL 50 MG/ML IJ SOLN
25.0000 mg | Freq: Once | INTRAMUSCULAR | Status: AC
  Administered 2020-01-19: 25 mg via INTRAVENOUS
  Filled 2020-01-19: qty 1

## 2020-01-19 MED ORDER — SODIUM CHLORIDE 0.9 % IV BOLUS
500.0000 mL | Freq: Once | INTRAVENOUS | Status: AC
  Administered 2020-01-19: 500 mL via INTRAVENOUS

## 2020-01-19 MED ORDER — IOHEXOL 300 MG/ML  SOLN
100.0000 mL | Freq: Once | INTRAMUSCULAR | Status: AC | PRN
  Administered 2020-01-19: 100 mL via INTRAVENOUS

## 2020-01-19 MED ORDER — SODIUM CHLORIDE (PF) 0.9 % IJ SOLN
INTRAMUSCULAR | Status: AC
  Filled 2020-01-19: qty 50

## 2020-01-19 NOTE — ED Notes (Signed)
ED Provider at bedside. 

## 2020-01-19 NOTE — ED Triage Notes (Signed)
Patient is complaining of pain where her cochlear implant is. Patient states that has been hurting for a few days now. Patient states that it made her feel dizzy.

## 2020-01-19 NOTE — ED Provider Notes (Signed)
[facility] DEPT Provider Note   CSN: [id number] Arrival date & time: 01/19/20  0106     History Chief Complaint  Patient presents with   Ear Pain    Diana Torres is a 29 y.o. 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  The patient reports that she has been having worsening pain around the left ear for the last 4 to 5 days.  She characterizes the pain as throbbing.  Pain has been constant and worsening since onset.  No known aggravating or alleviating factors.  She has been taking over-the-counter medication for pain without improvement in her symptoms.  She has also developed a global headache, fullness in her left ear, and dizziness.  She has had some nausea, but no vomiting.  No right otalgia, fever, chills, neck pain or stiffness, visual changes, tinnitus, otorrhea, sore throat, cough, shortness of breath, chest pain, or rash.  She stopped wearing her cochlear implants around the time that the pain began.  She is a followed for her cochlear implant in [place].    The history is provided by the patient and medical records. No language interpreter was used.       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   Procedure: LAPAROSCOPIC SPLEEN SPARING AND DISTAL PANCRETECTOMY ;  Surgeon: Stark Klein, MD;   Location: [facility];  Service: General;  Laterality: N/A;   LAPAROSCOPIC TUBAL LIGATION Bilateral 07/08/2016   Procedure: LAPAROSCOPIC TUBAL LIGATION WITH FILSHIE CLIPS;  Surgeon: Osborne Oman, MD;  Location: [facility];  Service: Gynecology;  Laterality: Bilateral;   RHINOPLASTY     right foot  2016   cyst removed   TONSILLECTOMY       OB History    Gravida  3   Para  2   Term  2   Preterm      AB  1   Living  2     SAB  1   TAB      Ectopic      Multiple  0   Live Births  2           Family History  Problem Relation Age of Onset   Hearing loss Mother    Hypertension Mother    Alcohol abuse Father    Hearing loss Father    Pancreatic cancer Maternal Grandmother     Social History   Tobacco Use   Smoking status: Never Smoker   Smokeless tobacco: Never Used  Substance Use Topics   Alcohol use: No   Drug use: No    Home Medications Prior to Admission medications   Medication Sig Start Date End Date Taking? Authorizing Provider  ibuprofen (ADVIL) 800 MG tablet Take 1 tablet (800  mg total) by mouth every 8 (eight) hours as needed. 03/19/19  Yes Caryl Ada K, PA-C  hyoscyamine (LEVSIN SL) 0.125 MG SL tablet Place 1 tablet (0.125 mg total) under the tongue every 6 (six) hours as needed. Patient not taking: Reported on 04/12/2018 08/08/17 03/19/19  Doran Stabler, MD  methscopolamine (PAMINE) 2.5 MG TABS tablet Take 1 tablet (2.5 mg total) by mouth 2 (two) times daily as needed. Patient not taking: Reported on 04/12/2018 09/23/17 03/19/19  Doran Stabler, MD    Allergies    Codeine  Review of Systems   Review of Systems  Constitutional: Negative for activity change, chills and fever.  HENT: Positive for ear pain and hearing loss (chronic). Negative for congestion, ear discharge, facial swelling, nosebleeds, postnasal drip, sinus pressure, sinus pain, sore throat, tinnitus and trouble swallowing.   Eyes: Negative for photophobia and  visual disturbance.  Respiratory: Negative for cough, shortness of breath and wheezing.   Cardiovascular: Negative for chest pain.  Gastrointestinal: Negative for abdominal pain, diarrhea, nausea and vomiting.  Genitourinary: Negative for dysuria.  Musculoskeletal: Negative for back pain, myalgias, neck pain and neck stiffness.  Skin: Negative for rash.  Allergic/Immunologic: Negative for immunocompromised state.  Neurological: Positive for dizziness and headaches. Negative for tremors, seizures, syncope, facial asymmetry, weakness, light-headedness and numbness.  Psychiatric/Behavioral: Negative for confusion.    Physical Exam Updated Vital Signs BP 107/84    Pulse 70    Temp 98.6 F (37 C)    Resp 17    LMP 01/04/2020    SpO2 98%   Physical Exam Vitals and nursing note reviewed.  Constitutional:      General: She is not in acute distress. HENT:     Head: Normocephalic.     Right Ear: Tympanic membrane, ear canal and external ear normal.     Ears:     Comments: Scarring noted to the left TM.  No mastoid tenderness.  No pain with movement of the tragus or auricle.  No lymphadenopathy. Eyes:     Conjunctiva/sclera: Conjunctivae normal.  Neck:     Comments: No meningismus Cardiovascular:     Rate and Rhythm: Normal rate and regular rhythm.     Heart sounds: No murmur heard.  No friction rub. No gallop.   Pulmonary:     Effort: Pulmonary effort is normal. No respiratory distress.  Abdominal:     General: There is no distension.     Palpations: Abdomen is soft. There is no mass.     Tenderness: There is no abdominal tenderness. There is no right CVA tenderness, left CVA tenderness, guarding or rebound.     Hernia: No hernia is present.  Musculoskeletal:     Cervical back: Neck supple.  Skin:    General: Skin is warm.     Findings: No rash.  Neurological:     Mental Status: She is alert.  Psychiatric:        Behavior: Behavior normal.     ED Results / Procedures /  Treatments   Labs (all labs ordered are listed, but only abnormal results are displayed) Labs Reviewed  CBC WITH DIFFERENTIAL/PLATELET  BASIC METABOLIC PANEL    EKG None  Radiology CT Temporal Bones W Contrast  Result Date: 01/19/2020 CLINICAL DATA:  Pain at site of cochlear implant. EXAM: CT TEMPORAL BONES WITH CONTRAST TECHNIQUE: Axial and coronal plane CT imaging of the petrous temporal bones was performed with thin-collimation image reconstruction after intravenous contrast administration. Multiplanar  CT image reconstructions were also generated. CONTRAST:  187mL OMNIPAQUE IOHEXOL 300 MG/ML  SOLN COMPARISON:  None. FINDINGS: RIGHT: --Pinna and external auditory canal: Normal. --Ossicular chain: Normal. No erosion or dislocation. --Tympanic membrane: Normal. --Middle ear: Normal. --Epitympanum: The Prussak space is clear. The scutum is sharp. Tegmen tympani is intact. --Cochlea, vestibule, vestibular aqueduct and semicircular canals: Normal. No evidence of canal dehiscence or otospongiosis. --Internal auditory canal: Normal. No widening of the porus acusticus. --Facial nerve: No focal abnormality along the course of the facial nerve. --Cerebellopontine angle: Normal. --Petrous Apex: Normal. --Mastoids: Normal. --Carotid canal: Normal position. LEFT: --Pinna and external auditory canal: Normal. --Ossicular chain: Normal. No erosion or dislocation. --Tympanic membrane: Normal. --Middle ear: Normal. --Epitympanum: The Prussak space is clear. The scutum is sharp. Tegmen tympani is intact. --Cochlea, vestibule, vestibular aqueduct and semicircular canals: Normal course of the cochlear implant, which makes a complete turn within the cochlear. --Internal auditory canal: Normal. No widening of the porus acusticus. --Facial nerve: No focal abnormality along the course of the facial nerve. --Cerebellopontine angle: Normal. --Petrous Apex: Normal. --Mastoids: Canal wall up mastoidectomy. --Carotid canal:  Normal position. OTHER: --Visualized intracranial: Normal. --Visualized paranasal sinuses: Normal. --Nasopharynx: Clear. --Temporomandibular joints: Normal. --Visualized extracranial soft tissues: The extracranial component of the cochlear implant is anchored in the left temporal bone. Streak artifact from the device degrades assessment of the adjacent soft tissues but there is no visible fluid collection. The superior 2/3 of the device is outside the scan range. IMPRESSION: 1. No focal abnormality along the course of the cochlear implant lead. 2. Status post left mastoidectomy with cochlear implant in place. The surrounding soft tissues are obscured by streak artifact from the device and superior 2/3 of the device is outside the scan range; but, within the limitation, there is no fluid collection or other focal abnormality. 3. Normal right temporal bone. Electronically Signed   By: Ulyses Jarred M.D.   On: 01/19/2020 06:42    Procedures Procedures (including critical care time)  Medications Ordered in ED Medications  sodium chloride (PF) 0.9 % injection (has no administration in time range)  prochlorperazine (COMPAZINE) injection 10 mg (10 mg Intravenous Given 01/19/20 0534)  diphenhydrAMINE (BENADRYL) injection 25 mg (25 mg Intravenous Given 01/19/20 0534)  ketorolac (TORADOL) 30 MG/ML injection 30 mg (30 mg Intravenous Given 01/19/20 0534)  sodium chloride 0.9 % bolus 500 mL (0 mLs Intravenous Stopped 01/19/20 0821)  iohexol (OMNIPAQUE) 300 MG/ML solution 100 mL (100 mLs Intravenous Contrast Given 01/19/20 0604)    ED Course  I have reviewed the triage vital signs and the nursing notes.  Pertinent labs & imaging results that were available during my care of the patient were reviewed by me and considered in my medical decision making (see chart for details).    MDM Rules/Calculators/A&P                          29 year old female with a history of a left cochlear implant presenting with  headache, left ear pain, intermittent dizziness, over the last few days.  No constitutional symptoms.  No neck pain or stiffness.  Vital signs are normal in the ER.  Patient was discussed with Dr. Florina Ou, attending physician.  Given her cochlear implants and concern for worsening pain, she will need a CT scan to ensure that she does not have infection around the cochlear implant.  Spoke with radiology who recommended CT with contrast of the temporal bone, which has  been ordered.  Labs and imaging have been individually reviewed and interpreted by me.  Labs are normal.  No leukocytosis.  CT temporal bone does not fully visualize the cochlear implant, but there does not appear to be any fluid collection.  No stranding or evidence of infection on my evaluation.  Patient had been given a migraine cocktail for her symptoms and on reevaluation, she reports marked improvement in her headache.  Discussed CT results with the patient.  At this time, she is hemodynamically stable and in no acute distress.  Doubt CVA, mastoiditis, infection surrounding the cochlear implant, meningitis, malignant otitis externa.  She will plan to follow-up with her cochlear implant team and she will be evaluated this week.  All questions due to.  ER return precautions given.  Safe for discharge to home with outpatient follow-up as indicated.  Final Clinical Impression(s) / ED Diagnoses Final diagnoses:  Bad headache    Rx / DC Orders ED Discharge Orders    None       Haylee Mcanany, [place], PA-C 01/19/20 0908    Molpus, Jenny Reichmann, MD 01/19/20 2253

## 2020-01-19 NOTE — Discharge Instructions (Addendum)
Thank you for allowing me to care for you today in the Emergency Department.   Your work-up was reassuring.  There is no evidence of ear infection on the CT.  You do not need antibiotics today.  Take 650 mg of Tylenol or 600 mg of ibuprofen with food every 6 hours for pain.  You can alternate between these 2 medications every 3 hours if your pain returns.  For instance, you can take Tylenol at noon, followed by a dose of ibuprofen at 3, followed by second dose of Tylenol and 6.  Please call your doctor in [place] to follow-up about your cochlear implant in the next few days.  Return to the emergency department if you develop uncontrollable vomiting, if you have a fall, become very off balance, have drainage from the ear, if you become unable to move your neck, or develop other new, concerning symptoms.

## 2020-01-21 ENCOUNTER — Telehealth: Payer: Self-pay

## 2020-01-21 NOTE — Telephone Encounter (Signed)
Transition Care Management Unsuccessful Follow-up Telephone Call  Date of discharge and from where:  01/19/2020 Diana Torres   Attempts:  1st Attempt  Reason for unsuccessful TCM follow-up call:  Left voice message

## 2020-01-22 ENCOUNTER — Telehealth: Payer: Self-pay | Admitting: *Deleted

## 2020-01-22 NOTE — Telephone Encounter (Signed)
Called patient to assist with follow up appointment ,left message to call back. Diana Torres PEC [phone]

## 2020-01-28 DIAGNOSIS — L03012 Cellulitis of left finger: Secondary | ICD-10-CM | POA: Diagnosis not present

## 2020-01-31 ENCOUNTER — Other Ambulatory Visit: Payer: Self-pay

## 2020-01-31 ENCOUNTER — Emergency Department (HOSPITAL_COMMUNITY)
Admission: EM | Admit: 2020-01-31 | Discharge: 2020-02-01 | Disposition: A | Discharge status: Home / Self Care | Payer: Medicaid Other | Attending: Emergency Medicine | Admitting: Emergency Medicine

## 2020-01-31 ENCOUNTER — Encounter (HOSPITAL_COMMUNITY): Payer: Self-pay

## 2020-01-31 DIAGNOSIS — L03012 Cellulitis of left finger: Secondary | ICD-10-CM | POA: Diagnosis not present

## 2020-01-31 DIAGNOSIS — R2231 Localized swelling, mass and lump, right upper limb: Secondary | ICD-10-CM | POA: Diagnosis present

## 2020-01-31 NOTE — ED Triage Notes (Signed)
Pt sts left 2nd digit swelling for 2-3 days. Seen at [facility] and had it drained and given abx. Uses sign language for communication.

## 2020-02-01 MED ORDER — LIDOCAINE HCL 2 % IJ SOLN
INTRAMUSCULAR | Status: AC
  Filled 2020-02-01: qty 20

## 2020-02-01 MED ORDER — LIDOCAINE HCL 2 % IJ SOLN
10.0000 mL | Freq: Once | INTRAMUSCULAR | Status: AC
  Administered 2020-02-01: 200 mg

## 2020-02-01 NOTE — ED Provider Notes (Signed)
[facility] DEPT Provider Note: Diana Spurling, MD, FACEP  CSN: [id number] MRN: [id number] ARRIVAL: 01/31/20 at [facility]: [id number]   CHIEF COMPLAINT  Finger Swelling   HISTORY OF PRESENT ILLNESS  [birth year] 89:37 AM Diana Torres is a 29 y.o. female who has had pain and swelling around the nail of her left index finger for the past 2 to 3 days.  She was seen at an urgent care yesterday morning and given antibiotics.  They also made an attempt to open and drain it but she states this was not very successful and the pain and swelling have worsened.  She rates her pain is a 5 out of 10, worse with palpation or movement.  The finger pad itself is not tender.  The patient is deaf and communication was through a professional video interpreter.   Past Medical History:  Diagnosis Date  . Deaf   . History of broken nose   . 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 Mass    cyst/left upper abd quad  . Miscarriage    pt states had difficulty with high BP prior to miscarriage and afterwards till infection was healed  . PONV (postoperative nausea and vomiting)   . UTI (lower urinary tract infection)     Past Surgical History:  Procedure Laterality Date  . COCHLEAR IMPLANT    . 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 LAPAROSCOPIC TUBAL LIGATION Bilateral 07/08/2016   Procedure: LAPAROSCOPIC TUBAL LIGATION WITH FILSHIE CLIPS;  Surgeon: Osborne Oman, MD;  Location: [facility];  Service: Gynecology;  Laterality: Bilateral;  . RHINOPLASTY    . right foot  2016   cyst removed  . TONSILLECTOMY      Family History  Problem Relation Age of Onset  . Hearing loss Mother   . Hypertension Mother   . Alcohol abuse Father   . Hearing loss Father   . Pancreatic cancer Maternal Grandmother     Social History   Tobacco Use  .  Smoking status: Never Smoker  . Smokeless tobacco: Never Used  Substance Use Topics  . Alcohol use: No  . Drug use: No    Prior to Admission medications   Medication Sig Start Date End Date Taking? Authorizing Provider  hyoscyamine (LEVSIN SL) 0.125 MG SL tablet Place 1 tablet (0.125 mg total) under the tongue every 6 (six) hours as needed. Patient not taking: Reported on 04/12/2018 08/08/17 03/19/19  Doran Stabler, MD  methscopolamine (PAMINE) 2.5 MG TABS tablet Take 1 tablet (2.5 mg total) by mouth 2 (two) times daily as needed. Patient not taking: Reported on 04/12/2018 09/23/17 03/19/19  Doran Stabler, MD    Allergies Codeine   REVIEW OF SYSTEMS  Negative except as noted here or in the History of Present Illness.   PHYSICAL EXAMINATION  Initial Vital Signs Blood pressure (!) 125/94, pulse 90, temperature 99.2 F (37.3 C), temperature source Oral, resp. rate 16, height 5\' 9"  (1.753 m), weight 77.1 kg, last menstrual period 01/04/2020, SpO2 100 %.  Examination General: Well-developed, well-nourished female in no acute distress; appearance consistent with age of record HENT: normocephalic; atraumatic Eyes: Normal appearance Neck: supple Heart: regular rate and rhythm Lungs: clear to auscultation bilaterally Abdomen: soft; nondistended Extremities: No deformity; full range of motion; tenderness and swelling of the tip of the right  index finger consistent with bilateral paronychia, no tenderness or swelling at the finger pad to suggest a felon, evidence of a small stab incision proximal to the thenar side of the nail fold:    Neurologic: Awake, alert and oriented; motor function intact in all extremities and symmetric; no facial droop; deaf Skin: Warm and dry Psychiatric: Normal mood and affect   RESULTS  Summary of this visit's results, reviewed and interpreted by myself:   EKG Interpretation  Date/Time:    Ventricular Rate:    PR Interval:    QRS Duration:     QT Interval:    QTC Calculation:   R Axis:     Text Interpretation:        Laboratory Studies: No results found for this or any previous visit (from the past 24 hour(s)). Imaging Studies: No results found.  ED COURSE and MDM  Nursing notes, initial and subsequent vitals signs, including pulse oximetry, reviewed and interpreted by myself.  Vitals:   01/31/20 2345 01/31/20 2352  BP: (!) 125/94   Pulse: 90   Resp: 16   Temp: 99.2 F (37.3 C)   TempSrc: Oral   SpO2: 100%   Weight:  77.1 kg  Height:  5\' 9"  (1.753 m)   Medications  lidocaine (XYLOCAINE) 2 % (with pres) injection 200 mg (200 mg Other Given 02/01/20 0050)    Patient advised to keep taking the antibiotic but that the principal treatment for paronychia is I&D which we have done.  PROCEDURES  Procedures  INCISION AND DRAINAGE Performed by: Karen Chafe Cyree Chuong Consent: Verbal consent obtained. Risks and benefits: risks, benefits and alternatives were discussed Type: Paronychia  Body area: Left index finger  Anesthesia: Digital block  Local anesthetic: lidocaine 2% without epinephrine  Anesthetic total: 4 ml  Incision was made with a scalpel parallel to the nail edges bilaterally  Complexity: complex Sharp dissection to break up loculations  Drainage: purulent (thenar side only)  Drainage amount: Moderate  Packing material: None  Patient tolerance: Patient tolerated the procedure well with no immediate complications.   ED DIAGNOSES     ICD-10-CM   1. Paronychia of left index finger  L03.012        Aayan Haskew, Jenny Reichmann, MD 02/01/20 [phone]

## 2020-02-04 ENCOUNTER — Telehealth: Payer: Self-pay | Admitting: *Deleted

## 2020-02-04 ENCOUNTER — Telehealth: Payer: Self-pay

## 2020-02-04 NOTE — Telephone Encounter (Signed)
Called patient to assist with follow up ,no answer . Si Jachim PEC [phone]

## 2020-02-04 NOTE — Telephone Encounter (Signed)
Transition Care Management Unsuccessful Follow-up Telephone Call  Date of discharge and from where:  02/01/2020 Diana Torres   Attempts:  1st Attempt  Reason for unsuccessful TCM follow-up call:  Left voice message with interpreter

## 2020-02-06 ENCOUNTER — Encounter (HOSPITAL_COMMUNITY): Payer: Self-pay | Admitting: Emergency Medicine

## 2020-02-06 ENCOUNTER — Other Ambulatory Visit: Payer: Self-pay

## 2020-02-06 ENCOUNTER — Emergency Department (HOSPITAL_COMMUNITY)
Admission: EM | Admit: 2020-02-06 | Discharge: 2020-02-07 | Disposition: A | Discharge status: Home / Self Care | Payer: Medicaid Other | Attending: Emergency Medicine | Admitting: Emergency Medicine

## 2020-02-06 DIAGNOSIS — M79645 Pain in left finger(s): Secondary | ICD-10-CM | POA: Diagnosis present

## 2020-02-06 DIAGNOSIS — L03012 Cellulitis of left finger: Secondary | ICD-10-CM | POA: Diagnosis not present

## 2020-02-06 NOTE — ED Triage Notes (Addendum)
Patient is death. Patient was seen last 24-Jul-2022 and given antibiotics. Patient states that it is not getting better and is painful. Patient also states it is making her itch and her body hurts. It is her pointer finger on the left. It is swollen.

## 2020-02-07 MED ORDER — CEPHALEXIN 500 MG PO CAPS: 500.0000 mg | ORAL_CAPSULE | Freq: Four times a day (QID) | ORAL | 0 refills | Status: DC

## 2020-02-07 MED ORDER — MORPHINE SULFATE 15 MG PO TABS: 7.5000 mg | ORAL_TABLET | ORAL | 0 refills | Status: DC | PRN

## 2020-02-07 MED ORDER — OXYCODONE HCL 5 MG PO TABS
5.0000 mg | ORAL_TABLET | Freq: Once | ORAL | Status: AC
  Administered 2020-02-07: 5 mg via ORAL
  Filled 2020-02-07: qty 1

## 2020-02-07 MED ORDER — LIDOCAINE-EPINEPHRINE (PF) 2 %-1:200000 IJ SOLN
10.0000 mL | Freq: Once | INTRAMUSCULAR | Status: AC
  Administered 2020-02-07: 10 mL via INTRADERMAL
  Filled 2020-02-07: qty 20

## 2020-02-07 MED ORDER — AMOXICILLIN-POT CLAVULANATE 875-125 MG PO TABS: 1.0000 | ORAL_TABLET | Freq: Two times a day (BID) | ORAL | 0 refills | Status: DC

## 2020-02-07 MED ORDER — SULFAMETHOXAZOLE-TRIMETHOPRIM 800-160 MG PO TABS
1.0000 | ORAL_TABLET | Freq: Once | ORAL | Status: AC
  Administered 2020-02-07: 1 via ORAL
  Filled 2020-02-07: qty 1

## 2020-02-07 MED ORDER — IBUPROFEN 800 MG PO TABS
800.0000 mg | ORAL_TABLET | Freq: Once | ORAL | Status: AC
  Administered 2020-02-07: 800 mg via ORAL
  Filled 2020-02-07: qty 1

## 2020-02-07 MED ORDER — ACETAMINOPHEN 500 MG PO TABS
1000.0000 mg | ORAL_TABLET | Freq: Once | ORAL | Status: AC
  Administered 2020-02-07: 1000 mg via ORAL
  Filled 2020-02-07: qty 2

## 2020-02-07 NOTE — Discharge Instructions (Signed)
Keep the packing in for 24 hours.  After that do warm soaks or compresses at least 4 times a day.  Take the antibiotics as prescribed.  Please return for rapid spreading redness or if you develop a fever.  Take 4 over the counter ibuprofen tablets 3 times a day or 2 over-the-counter naproxen tablets twice a day for pain. Also take tylenol 1000mg (2 extra strength) four times a day.   Then take the pain medicine if you feel like you need it. Narcotics do not help with the pain, they only make you care about it less.  You can become addicted to this, people may break into your house to steal it.  It will constipate you.  If you drive under the influence of this medicine you can get a DUI.

## 2020-02-07 NOTE — ED Provider Notes (Signed)
[place] DEPT Provider Note   CSN: [id number] Arrival date & time: 02/06/20  2308     History Chief Complaint  Patient presents with  . Finger Injury    Diana Torres is a 29 y.o. female.  29 yo F with a chief complaints of left second digit pain.  Patient was seen in the ED about a week ago had a I&D of the paronychia.  Since then the patient feels that the swelling is gotten somewhat worse and she feels like the pain radiates up her arm and the pad of her finger.  She denies overt fevers but has felt hot off and on.  Denies recurrent trauma.  Has finished her antibiotics.  Thinks that she was on Keflex.  The history is provided by the patient.  Illness Severity:  Moderate Onset quality:  Gradual Duration:  1 week Timing:  Constant Progression:  Worsening Chronicity:  New Associated symptoms: no chest pain, no congestion, no fever, no headaches, no myalgias, no nausea, no rhinorrhea, no shortness of breath, no vomiting and no wheezing        Past Medical History:  Diagnosis Date  . Deaf   . History of broken nose   . 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 Mass    cyst/left upper abd quad  . Miscarriage    pt states had difficulty with high BP prior to miscarriage and afterwards till infection was healed  . PONV (postoperative nausea and vomiting)   . UTI (lower urinary tract infection)     Patient Active Problem List   Diagnosis Date Noted  . Pelvic pain 03/21/2019  . Hearing impaired 08/14/2012    Past Surgical History:  Procedure Laterality Date  . COCHLEAR IMPLANT    . 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 LAPAROSCOPIC TUBAL LIGATION Bilateral 07/08/2016   Procedure: LAPAROSCOPIC TUBAL LIGATION WITH FILSHIE CLIPS;  Surgeon: Osborne Oman, MD;  Location: [facility];  Service: Gynecology;  Laterality: Bilateral;  . RHINOPLASTY    . right foot  2016   cyst removed  . TONSILLECTOMY       OB History    Gravida  3   Para  2   Term  2   Preterm      AB  1   Living  2     SAB  1   TAB      Ectopic      Multiple  0   Live Births  2           Family History  Problem Relation Age of Onset  . Hearing loss Mother   . Hypertension Mother   . Alcohol abuse Father   . Hearing loss Father   . Pancreatic cancer Maternal Grandmother     Social History   Tobacco Use  . Smoking status: Never Smoker  . Smokeless tobacco: Never Used  Substance Use Topics  . Alcohol use: No  . Drug use: No    Home Medications Prior to Admission medications   Medication Sig Start Date End Date Taking? Authorizing Provider  amoxicillin-clavulanate (AUGMENTIN) 875-125 MG tablet Take 1 tablet by mouth every 12 (twelve) hours. 02/07/20   Deno Etienne, DO  cephALEXin (KEFLEX) 500 MG capsule Take 1 capsule (500 mg total) by mouth 4 (four) times daily. 02/07/20  Deno Etienne, DO  morphine (MSIR) 15 MG tablet Take 0.5 tablets (7.5 mg total) by mouth every 4 (four) hours as needed for severe pain. 02/07/20   Deno Etienne, DO  hyoscyamine (LEVSIN SL) 0.125 MG SL tablet Place 1 tablet (0.125 mg total) under the tongue every 6 (six) hours as needed. Patient not taking: Reported on 04/12/2018 08/08/17 03/19/19  Doran Stabler, MD  methscopolamine (PAMINE) 2.5 MG TABS tablet Take 1 tablet (2.5 mg total) by mouth 2 (two) times daily as needed. Patient not taking: Reported on 04/12/2018 09/23/17 03/19/19  Doran Stabler, MD    Allergies    Codeine  Review of Systems   Review of Systems  Constitutional: Negative for chills and fever.  HENT: Negative for congestion and rhinorrhea.   Eyes: Negative for redness and visual disturbance.  Respiratory: Negative for shortness of breath and wheezing.   Cardiovascular: Negative for chest pain and palpitations.    Gastrointestinal: Negative for nausea and vomiting.  Genitourinary: Negative for dysuria and urgency.  Musculoskeletal: Negative for arthralgias and myalgias.  Skin: Positive for color change and wound. Negative for pallor.  Neurological: Negative for dizziness and headaches.    Physical Exam Updated Vital Signs BP (!) 141/95   Pulse 88   Temp 98.3 F (36.8 C) (Oral)   Resp 16   Ht 5\' 9"  (1.753 m)   Wt 77.1 kg   SpO2 100%   BMI 25.10 kg/m   Physical Exam Vitals and nursing note reviewed.  Constitutional:      General: She is not in acute distress.    Appearance: She is well-developed. She is not diaphoretic.  HENT:     Head: Normocephalic and atraumatic.  Eyes:     Pupils: Pupils are equal, round, and reactive to light.  Cardiovascular:     Rate and Rhythm: Normal rate and regular rhythm.     Heart sounds: No murmur heard.  No friction rub. No gallop.   Pulmonary:     Effort: Pulmonary effort is normal.     Breath sounds: No wheezing or rales.  Abdominal:     General: There is no distension.     Palpations: Abdomen is soft.     Tenderness: There is no abdominal tenderness.  Musculoskeletal:        General: No tenderness.       Hands:     Cervical back: Normal range of motion and neck supple.  Skin:    General: Skin is warm and dry.  Neurological:     Mental Status: She is alert and oriented to person, place, and time.  Psychiatric:        Behavior: Behavior normal.     ED Results / Procedures / Treatments   Labs (all labs ordered are listed, but only abnormal results are displayed) Labs Reviewed - No data to display  EKG None  Radiology No results found.  Procedures .Marland KitchenIncision and Drainage  Date/Time: 02/07/2020 2:10 AM Performed by: Deno Etienne, DO Authorized by: Deno Etienne, DO   Consent:    Consent obtained:  Verbal   Consent given by:  Patient and spouse   Risks discussed:  Bleeding, incomplete drainage, infection and damage to other  organs   Alternatives discussed:  No treatment, delayed treatment and alternative treatment Location:    Type:  Abscess   Size:  Dime   Location:  Upper extremity   Upper extremity location:  Finger   Finger location:  L index  finger Pre-procedure details:    Skin preparation:  Chloraprep Anesthesia (see MAR for exact dosages):    Anesthesia method:  Nerve block   Block location:  Digital   Block needle gauge:  27 G   Block anesthetic:  Lidocaine 2% WITH epi   Block technique:  Digital   Block injection procedure:  Anatomic landmarks identified, anatomic landmarks palpated and negative aspiration for blood   Block outcome:  Anesthesia achieved Procedure type:    Complexity:  Complex Procedure details:    Needle aspiration: yes     Incision types:  Single straight   Incision depth:  Subcutaneous   Scalpel blade:  11   Wound management:  Probed and deloculated   Drainage:  Bloody   Drainage amount:  Moderate   Wound treatment:  Wound left open and drain placed   Packing materials:  1/4 in gauze   Amount 1/4":  2in Post-procedure details:    Patient tolerance of procedure:  Tolerated well, no immediate complications Comments:     Incision made with an 11 blade.  Blood without purulence.  I was able to take a Kelly clamp and go to either end of the finger dorsally and about midway down on the radial aspect of the finger.  There is no apparent extension into the pad of the finger.   (including critical care time)  Medications Ordered in ED Medications  sulfamethoxazole-trimethoprim (BACTRIM DS) 800-160 MG per tablet 1 tablet (has no administration in time range)  lidocaine-EPINEPHrine (XYLOCAINE W/EPI) 2 %-1:200000 (PF) injection 10 mL (10 mLs Intradermal Given 02/07/20 0124)    ED Course  I have reviewed the triage vital signs and the nursing notes.  Pertinent labs & imaging results that were available during my care of the patient were reviewed by me and considered in my  medical decision making (see chart for details).    MDM Rules/Calculators/A&P                          29 yo F with a chief complaint of finger pain and swelling.  Is been going on for about a week now.  Patient initially had a cuts and seems it has gotten infected.  Had a paronychia that was drained in the ED about a week ago.  Has finished a course of Keflex and has had worsening pain and swelling.  She has some swelling and pain to the pad of the finger, could be consistent with a felon.  Will discuss with hand surgery. I discussed case with Dr. Grandville Silos, hand surgery recommended I&D in the emergency department.  After some discussion with him about the physical exam and the presentation he suggested lancing the paronychia and placing packing to keep it open for 24 hours.  He recommended antibiotics with MRSA coverage.  Unfortunately he is not available to see her in 48 hours as would be the normal timing for ID follow-up and so he recommended she follow back up at urgent care or the ED.  I discussed this with the patient.  We will give her information to follow-up with the orthopedic office if she so chooses.  Have her return for rapid spreading redness or if she develops a fever.  2:13 AM:  I have discussed the diagnosis/risks/treatment options with the patient and family and believe the pt to be eligible for discharge home to follow-up with PCP, Hand surgery. We also discussed returning to the ED immediately if new  or worsening sx occur. We discussed the sx which are most concerning (e.g., sudden worsening pain, fever, inability to tolerate by mouth) that necessitate immediate return. Medications administered to the patient during their visit and any new prescriptions provided to the patient are listed below.  Medications given during this visit Medications  sulfamethoxazole-trimethoprim (BACTRIM DS) 800-160 MG per tablet 1 tablet (has no administration in time range)  lidocaine-EPINEPHrine  (XYLOCAINE W/EPI) 2 %-1:200000 (PF) injection 10 mL (10 mLs Intradermal Given 02/07/20 0124)     The patient appears reasonably screen and/or stabilized for discharge and I doubt any other medical condition or other [facility] requiring further screening, evaluation, or treatment in the ED at this time prior to discharge.   Final Clinical Impression(s) / ED Diagnoses Final diagnoses:  Paronychia of finger, left    Rx / DC Orders ED Discharge Orders         Ordered    amoxicillin-clavulanate (AUGMENTIN) 875-125 MG tablet  Every 12 hours        02/07/20 0207    cephALEXin (KEFLEX) 500 MG capsule  4 times daily        02/07/20 0207    morphine (MSIR) 15 MG tablet  Every 4 hours PRN        02/07/20 0207           Deno Etienne, DO 02/07/20 [id number]

## 2020-02-08 ENCOUNTER — Telehealth: Payer: Self-pay

## 2020-02-08 DIAGNOSIS — L03012 Cellulitis of left finger: Secondary | ICD-10-CM | POA: Diagnosis not present

## 2020-02-08 NOTE — Telephone Encounter (Signed)
Transition Care Management Unsuccessful Follow-up Telephone Call  Date of discharge and from where: 02/07/2020 [facility]  Attempts:  1st Attempt  Reason for unsuccessful TCM follow-up call:  Left voice message with interpreter

## 2020-02-10 DIAGNOSIS — M79645 Pain in left finger(s): Secondary | ICD-10-CM | POA: Diagnosis not present

## 2020-02-10 DIAGNOSIS — L03012 Cellulitis of left finger: Secondary | ICD-10-CM | POA: Diagnosis not present

## 2020-02-11 NOTE — Telephone Encounter (Signed)
Transition Care Management Unsuccessful Follow-up Telephone Call  Date of discharge and from where: 02/07/2020 [name]  Attempts:  2nd Attempt  Reason for unsuccessful TCM follow-up call:  Left voice message with interpreter

## 2020-02-12 DIAGNOSIS — L03012 Cellulitis of left finger: Secondary | ICD-10-CM | POA: Diagnosis not present

## 2020-02-15 NOTE — Telephone Encounter (Signed)
Transition Care Management Unsuccessful Follow-up Telephone Call  Date of discharge and from where:  02/07/2020 [facility]  Attempts:  3rd Attempt  Reason for unsuccessful TCM follow-up call:  Left voice message with interpreter

## 2020-04-28 ENCOUNTER — Other Ambulatory Visit: Payer: Medicaid Other

## 2020-04-28 DIAGNOSIS — Z20822 Contact with and (suspected) exposure to covid-19: Secondary | ICD-10-CM | POA: Diagnosis not present

## 2020-04-29 LAB — SARS-COV-2, NAA 2 DAY TAT

## 2020-04-29 LAB — NOVEL CORONAVIRUS, NAA: SARS-CoV-2, NAA: NOT DETECTED

## 2020-07-22 ENCOUNTER — Emergency Department (HOSPITAL_BASED_OUTPATIENT_CLINIC_OR_DEPARTMENT_OTHER)
Admission: EM | Admit: 2020-07-22 | Discharge: 2020-07-23 | Disposition: A | Discharge status: Home / Self Care | Payer: Medicaid Other | Attending: Emergency Medicine | Admitting: Emergency Medicine

## 2020-07-22 ENCOUNTER — Other Ambulatory Visit: Payer: Self-pay

## 2020-07-22 ENCOUNTER — Encounter (HOSPITAL_BASED_OUTPATIENT_CLINIC_OR_DEPARTMENT_OTHER): Payer: Self-pay

## 2020-07-22 DIAGNOSIS — R1032 Left lower quadrant pain: Secondary | ICD-10-CM | POA: Diagnosis not present

## 2020-07-22 DIAGNOSIS — R1030 Lower abdominal pain, unspecified: Secondary | ICD-10-CM | POA: Diagnosis not present

## 2020-07-22 LAB — CBC WITH DIFFERENTIAL/PLATELET
Abs Immature Granulocytes: 0.04 10*3/uL (ref 0.00–0.07)
Basophils Absolute: 0.1 10*3/uL (ref 0.0–0.1)
Basophils Relative: 1 %
Eosinophils Absolute: 0.4 10*3/uL (ref 0.0–0.5)
Eosinophils Relative: 5 %
HCT: 36.7 % (ref 36.0–46.0)
Hemoglobin: 12.2 g/dL (ref 12.0–15.0)
Immature Granulocytes: 0 %
Lymphocytes Relative: 26 %
Lymphs Abs: 2.5 10*3/uL (ref 0.7–4.0)
MCH: 29.9 pg (ref 26.0–34.0)
MCHC: 33.2 g/dL (ref 30.0–36.0)
MCV: 90 fL (ref 80.0–100.0)
Monocytes Absolute: 0.7 10*3/uL (ref 0.1–1.0)
Monocytes Relative: 7 %
Neutro Abs: 5.8 10*3/uL (ref 1.7–7.7)
Neutrophils Relative %: 61 %
Platelets: 254 10*3/uL (ref 150–400)
RBC: 4.08 MIL/uL (ref 3.87–5.11)
RDW: 12.7 % (ref 11.5–15.5)
WBC: 9.4 10*3/uL (ref 4.0–10.5)
nRBC: 0 % (ref 0.0–0.2)

## 2020-07-22 NOTE — ED Provider Notes (Signed)
Emergency Department Provider Note   I have reviewed the triage vital signs and the nursing notes.   HISTORY  Chief Complaint No chief complaint on file.   HPI Diana Torres is a 30 y.o. female with past medical history reviewed below presents to the emergency department with lower abdominal pain over the past 3 weeks.  She describes pain mainly in the left lower quadrant but occasionally radiating over to the right.  She feels like it may have initially felt like menstrual cramping but she did not have vaginal bleeding.  Her last menstrual cycle was April 7.  She denies any dysuria, hesitancy, urgency.  No vaginal bleeding or discharge.  No concern for sexually transmitted infection.  No fevers or chills.  With worsening symptoms she presents to the emergency department for evaluation.   Video ASL interpreter used for the encounter.   Past Medical History:  Diagnosis Date  . Deaf   . History of broken nose   . 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 Mass    cyst/left upper abd quad  . Miscarriage    pt states had difficulty with high BP prior to miscarriage and afterwards till infection was healed  . PONV (postoperative nausea and vomiting)   . UTI (lower urinary tract infection)     Patient Active Problem List   Diagnosis Date Noted  . Pelvic pain 03/21/2019  . Hearing impaired 08/14/2012    Past Surgical History:  Procedure Laterality Date  . COCHLEAR IMPLANT    . 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 LAPAROSCOPIC TUBAL LIGATION Bilateral 07/08/2016   Procedure: LAPAROSCOPIC TUBAL LIGATION WITH FILSHIE CLIPS;  Surgeon: Osborne Oman, MD;  Location: [facility] [place] [facility];  Service: Gynecology;  Laterality: Bilateral;  . RHINOPLASTY    . right foot  2016   cyst removed  . TONSILLECTOMY       Allergies Codeine  Family History  Problem Relation Age of Onset  . Hearing loss Mother   . Hypertension Mother   . Alcohol abuse Father   . Hearing loss Father   . Pancreatic cancer Maternal Grandmother     Social History Social History   Tobacco Use  . Smoking status: Never Smoker  . Smokeless tobacco: Never Used  Substance Use Topics  . Alcohol use: No  . Drug use: No    Review of Systems  Constitutional: No fever/chills Eyes: No visual changes. ENT: No sore throat. Cardiovascular: Denies chest pain. Respiratory: Denies shortness of breath. Gastrointestinal: Positive LLQ abdominal pain.  No nausea, no vomiting.  No diarrhea.  No constipation. Genitourinary: Negative for dysuria. Musculoskeletal: Negative for back pain. Skin: Negative for rash. Neurological: Negative for headaches, focal weakness or numbness.  10-point ROS otherwise negative.  ____________________________________________   PHYSICAL EXAM:  VITAL SIGNS: ED Triage Vitals  Enc Vitals Group     BP 07/22/20 2313 124/80     Pulse Rate 07/22/20 2313 76     Resp 07/22/20 2313 20     Temp 07/22/20 2313 98.3 F (36.8 C)     Temp Source 07/22/20 2313 Oral     SpO2 07/22/20 2313 98 %     Weight 07/22/20 2314 167 lb (75.8 kg)     Height 07/22/20 2314 5\' 4"  (1.626 m)   Constitutional: Alert and oriented. Well appearing and in no  acute distress. Eyes: Conjunctivae are normal.  Head: Atraumatic. Nose: No congestion/rhinnorhea. Neck: No stridor.   Cardiovascular: Normal rate, regular rhythm. Good peripheral circulation. Grossly normal heart sounds.   Respiratory: Normal respiratory effort.  No retractions. Lungs CTAB. Gastrointestinal: Soft with mild tenderness in the LLQ of the abdomen w/o rebound or guarding. No distention.  Genitourinary: Exam performed with nurse chaperone, AML interpreter, and patient's consent. No CMT. No adnexal masses but some left adnexal tenderness noted.   Musculoskeletal: No gross deformities of extremities. Neurologic:  Moving all extremities equally.   Skin:  Skin is warm, dry and intact. No rash noted.  ____________________________________________   LABS (all labs ordered are listed, but only abnormal results are displayed)  Labs Reviewed  WET PREP, GENITAL - Abnormal; Notable for the following components:      Result Value   WBC, Wet Prep HPF POC FEW (*)    All other components within normal limits  COMPREHENSIVE METABOLIC PANEL - Abnormal; Notable for the following components:   AST 11 (*)    All other components within normal limits  URINALYSIS, ROUTINE W REFLEX MICROSCOPIC - Abnormal; Notable for the following components:   Leukocytes,Ua SMALL (*)    All other components within normal limits  LIPASE, BLOOD  CBC WITH DIFFERENTIAL/PLATELET  PREGNANCY, URINE  GC/CHLAMYDIA PROBE AMP ([place]) NOT AT [facility]   ____________________________________________  RADIOLOGY  CT ABDOMEN PELVIS W CONTRAST  Result Date: 07/23/2020 CLINICAL DATA:  Lower abdominal pain EXAM: CT ABDOMEN AND PELVIS WITH CONTRAST TECHNIQUE: Multidetector CT imaging of the abdomen and pelvis was performed using the standard protocol following bolus administration of intravenous contrast. CONTRAST:  133mL OMNIPAQUE IOHEXOL 300 MG/ML  SOLN COMPARISON:  03/19/2019 FINDINGS: Lower chest: Lung bases are clear. No effusions. Heart is normal size. Hepatobiliary: No focal hepatic abnormality. Gallbladder unremarkable. Pancreas: No focal abnormality or ductal dilatation. Spleen: No focal abnormality.  Normal size. Adrenals/Urinary Tract: No adrenal abnormality. No focal renal abnormality. No stones or hydronephrosis. Urinary bladder is unremarkable. Stomach/Bowel: Normal appendix. Stomach, large and small bowel grossly unremarkable. Vascular/Lymphatic: No evidence of aneurysm or adenopathy. Reproductive: Uterus and adnexa unremarkable.  No mass. Other: No free fluid or free  air. Musculoskeletal: No acute bony abnormality. IMPRESSION: No acute findings in the abdomen or pelvis. Electronically Signed   By: Rolm Baptise M.D.   On: 07/23/2020 00:47   US PELVIC COMPLETE W TRANSVAGINAL AND TORSION R/O  Result Date: 07/23/2020 CLINICAL DATA:  Abdominal pain, left lower quadrant pain EXAM: TRANSABDOMINAL AND TRANSVAGINAL ULTRASOUND OF PELVIS DOPPLER ULTRASOUND OF OVARIES TECHNIQUE: Both transabdominal and transvaginal ultrasound examinations of the pelvis were performed. Transabdominal technique was performed for global imaging of the pelvis including uterus, ovaries, adnexal regions, and pelvic cul-de-sac. It was necessary to proceed with endovaginal exam following the transabdominal exam to visualize the uterus, endometrium, ovaries and adnexa. Color and duplex Doppler ultrasound was utilized to evaluate blood flow to the ovaries. COMPARISON:  CT earlier today FINDINGS: Uterus Measurements: 8.3 x 5.3 x 5.2 cm = volume: 118 mL. No fibroids or other mass visualized. Endometrium Thickness: 10 mm in thickness.  No focal abnormality visualized. Right ovary Measurements: 3.5 x 2.7 x 3.4 cm = volume: 16 mL. 2.4 cm dominant follicle in the left ovary. No adnexal mass. Left ovary Measurements: 3.1 x 2.2 x 2.6 cm = volume: 9.5 mL. Normal appearance/no adnexal mass. Pulsed Doppler evaluation of both ovaries demonstrates normal low-resistance arterial and venous waveforms. Other findings No abnormal free fluid. IMPRESSION:  No acute findings in the pelvis.  No evidence of ovarian torsion. Electronically Signed   By: Rolm Baptise M.D.   On: 07/23/2020 02:42    ____________________________________________   PROCEDURES  Procedure(s) performed:   Procedures  None  ____________________________________________   INITIAL IMPRESSION / ASSESSMENT AND PLAN / ED COURSE  Pertinent labs & imaging results that were available during my care of the patient were reviewed by me and considered in my  medical decision making (see chart for details).   Patient presents emergency department valuation of left lower quadrant abdominal pain over the past 3 weeks.  She does have mild tenderness in the left lower abdomen.  Differential includes but not limited to diverticulitis, colitis, fibroids, with much lower suspicion for ureteral stone or ovarian torsion. Plan for labs and CT abdomen/pelvis.   01:10 AM  Updated patient using the sign language interpreter regarding her lab results and CT scan.  Pregnancy test is negative.  No evidence of urine infection.  Lab work including lipase and CBC are largely unremarkable.  CT abdomen pelvis with no acute findings.  The uterus and adnexa are normal on CT. we discussed moving forward with a pelvic exam with swabs and patient provides verbal consent for that using ASL interpreter.   Wet prep WNL. Pelvic US obtained to evaluate for torsion. [place] with no acute findings. Plan for Motrin and Bentyl for symptom mgmt and PCP follow up plan. Discussed results and plan with ASL interpreter and return precautions in writing.  ____________________________________________  FINAL CLINICAL IMPRESSION(S) / ED DIAGNOSES  Final diagnoses:  Lower abdominal pain     MEDICATIONS GIVEN DURING THIS VISIT:  Medications  iohexol (OMNIPAQUE) 300 MG/ML solution 100 mL (100 mLs Intravenous Contrast Given 07/23/20 0037)  ibuprofen (ADVIL) tablet 800 mg (800 mg Oral Given 07/23/20 0257)     NEW OUTPATIENT MEDICATIONS STARTED DURING THIS VISIT:  Discharge Medication List as of 07/23/2020  2:52 AM    START taking these medications   Details  dicyclomine (BENTYL) 20 MG tablet Take 1 tablet (20 mg total) by mouth 3 (three) times daily as needed for spasms., Starting Wed 07/23/2020, Normal    ibuprofen (ADVIL) 800 MG tablet Take 1 tablet (800 mg total) by mouth every 8 (eight) hours as needed for moderate pain., Starting Wed 07/23/2020, Normal        Note:  This document was  prepared using Dragon voice recognition software and may include unintentional dictation errors.  Nanda Quinton, MD, [facility]    Llana Deshazo, [facility], MD 07/23/20 [phone]

## 2020-07-22 NOTE — ED Triage Notes (Signed)
Pt is deaf -  She uses her phone to communicate -    She reports lower abd pain that started about 3 wks  And nausea and progressively getting worse    Pain score 7/10

## 2020-07-23 ENCOUNTER — Emergency Department (HOSPITAL_BASED_OUTPATIENT_CLINIC_OR_DEPARTMENT_OTHER): Payer: Medicaid Other

## 2020-07-23 DIAGNOSIS — R1030 Lower abdominal pain, unspecified: Secondary | ICD-10-CM | POA: Diagnosis not present

## 2020-07-23 DIAGNOSIS — R1032 Left lower quadrant pain: Secondary | ICD-10-CM | POA: Diagnosis not present

## 2020-07-23 DIAGNOSIS — R109 Unspecified abdominal pain: Secondary | ICD-10-CM | POA: Diagnosis not present

## 2020-07-23 LAB — WET PREP, GENITAL
Clue Cells Wet Prep HPF POC: NONE SEEN
Sperm: NONE SEEN
Trich, Wet Prep: NONE SEEN
Yeast Wet Prep HPF POC: NONE SEEN

## 2020-07-23 LAB — URINALYSIS, ROUTINE W REFLEX MICROSCOPIC
Bilirubin Urine: NEGATIVE
Glucose, UA: NEGATIVE mg/dL
Hgb urine dipstick: NEGATIVE
Ketones, ur: NEGATIVE mg/dL
Nitrite: NEGATIVE
Protein, ur: NEGATIVE mg/dL
Specific Gravity, Urine: 1.026 (ref 1.005–1.030)
pH: 6 (ref 5.0–8.0)

## 2020-07-23 LAB — COMPREHENSIVE METABOLIC PANEL
ALT: 10 U/L (ref 0–44)
AST: 11 U/L — ABNORMAL LOW (ref 15–41)
Albumin: 4.1 g/dL (ref 3.5–5.0)
Alkaline Phosphatase: 53 U/L (ref 38–126)
Anion gap: 5 (ref 5–15)
BUN: 12 mg/dL (ref 6–20)
CO2: 29 mmol/L (ref 22–32)
Calcium: 9.4 mg/dL (ref 8.9–10.3)
Chloride: 104 mmol/L (ref 98–111)
Creatinine, Ser: 0.69 mg/dL (ref 0.44–1.00)
GFR, Estimated: >60 mL/min (ref >60)
Glucose, Bld: 87 mg/dL (ref 70–99)
Potassium: 4 mmol/L (ref 3.5–5.1)
Sodium: 138 mmol/L (ref 135–145)
Total Bilirubin: 0.3 mg/dL (ref 0.3–1.2)
Total Protein: 6.9 g/dL (ref 6.5–8.1)

## 2020-07-23 LAB — LIPASE, BLOOD: Lipase: 34 U/L (ref 11–51)

## 2020-07-23 LAB — PREGNANCY, URINE: Preg Test, Ur: NEGATIVE

## 2020-07-23 MED ORDER — DICYCLOMINE HCL 20 MG PO TABS: 20.0000 mg | ORAL_TABLET | Freq: Three times a day (TID) | ORAL | 0 refills | Status: DC | PRN

## 2020-07-23 MED ORDER — IOHEXOL 300 MG/ML  SOLN
100.0000 mL | Freq: Once | INTRAMUSCULAR | Status: AC | PRN
  Administered 2020-07-23: 100 mL via INTRAVENOUS

## 2020-07-23 MED ORDER — IBUPROFEN 800 MG PO TABS
800.0000 mg | ORAL_TABLET | Freq: Once | ORAL | Status: AC
  Administered 2020-07-23: 800 mg via ORAL
  Filled 2020-07-23: qty 1

## 2020-07-23 MED ORDER — IBUPROFEN 800 MG PO TABS: 800.0000 mg | ORAL_TABLET | Freq: Three times a day (TID) | ORAL | 0 refills | Status: DC | PRN

## 2020-07-23 NOTE — Discharge Instructions (Signed)

## 2020-07-24 LAB — GC/CHLAMYDIA PROBE AMP (~~LOC~~) NOT AT ARMC
Chlamydia: NEGATIVE
Comment: NEGATIVE
Comment: NORMAL
Neisseria Gonorrhea: NEGATIVE

## 2020-09-17 ENCOUNTER — Other Ambulatory Visit: Payer: Self-pay

## 2020-09-17 ENCOUNTER — Encounter (HOSPITAL_BASED_OUTPATIENT_CLINIC_OR_DEPARTMENT_OTHER): Payer: Self-pay | Admitting: Emergency Medicine

## 2020-09-17 ENCOUNTER — Emergency Department (HOSPITAL_BASED_OUTPATIENT_CLINIC_OR_DEPARTMENT_OTHER)
Admission: EM | Admit: 2020-09-17 | Discharge: 2020-09-17 | Disposition: A | Discharge status: Home / Self Care | Payer: Medicaid Other | Attending: Emergency Medicine | Admitting: Emergency Medicine

## 2020-09-17 DIAGNOSIS — L03011 Cellulitis of right finger: Secondary | ICD-10-CM | POA: Diagnosis not present

## 2020-09-17 MED ORDER — LIDOCAINE HCL (PF) 1 % IJ SOLN
10.0000 mL | Freq: Once | INTRAMUSCULAR | Status: AC
  Administered 2020-09-17: 10 mL
  Filled 2020-09-17: qty 10

## 2020-09-17 MED ORDER — BUPIVACAINE HCL (PF) 0.5 % IJ SOLN
10.0000 mL | Freq: Once | INTRAMUSCULAR | Status: AC
  Administered 2020-09-17: 10 mL
  Filled 2020-09-17: qty 10

## 2020-09-17 MED ORDER — DOXYCYCLINE HYCLATE 100 MG PO CAPS: 100.0000 mg | ORAL_CAPSULE | Freq: Two times a day (BID) | ORAL | 0 refills | Status: DC

## 2020-09-17 NOTE — ED Triage Notes (Signed)
  Patient comes in with infection around her R middle finger.  Patient states it has been swollen and painful for two days.  No fevers at home.  Took tylenol earlier this morning for pain.  Patient is hearing impaired.  Pain 9/10 throbbing in finger.

## 2020-09-17 NOTE — ED Provider Notes (Signed)
[facility] DEPT Provider Note   CSN: [id number] Arrival date & time: 09/17/20  0149     History Chief Complaint  Patient presents with   Finger Injury    Diana Torres is a 30 y.o. female.  Patient is a 30 year old female who presents with pain to her right middle finger.  She states she has noticed some swelling and pain to the tip of the finger for the last 2 days.  It got more painful this morning which is why she came in.  She has no fevers.  She took Tylenol earlier today with some improvement.  No history of injuries to the finger.  No history of similar symptoms.  She is hearing impaired and history was obtained through an ASL video interpreter.      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   Procedure: LAPAROSCOPIC SPLEEN SPARING AND DISTAL PANCRETECTOMY ;  Surgeon: Stark Klein, MD;  Location: [facility];  Service: General;  Laterality: N/A;   LAPAROSCOPIC TUBAL LIGATION Bilateral 07/08/2016   Procedure: LAPAROSCOPIC TUBAL LIGATION WITH FILSHIE CLIPS;  Surgeon: Osborne Oman, MD;  Location: [facility];  Service: Gynecology;  Laterality: Bilateral;   RHINOPLASTY     right foot  2016   cyst removed   TONSILLECTOMY       OB History     Gravida  3   Para  2   Term  2   Preterm      AB  1   Living  2      SAB  1   IAB      Ectopic      Multiple  0   Live Births  2           Family  History  Problem Relation Age of Onset   Hearing loss Mother    Hypertension Mother    Alcohol abuse Father    Hearing loss Father    Pancreatic cancer Maternal Grandmother     Social History   Tobacco Use   Smoking status: Never   Smokeless tobacco: Never  Substance Use Topics   Alcohol use: No   Drug use: No    Home Medications Prior to Admission medications   Medication Sig Start Date End Date Taking? Authorizing Provider  doxycycline (VIBRAMYCIN) 100 MG capsule Take 1 capsule (100 mg total) by mouth 2 (two) times daily. One po bid x 7 days 09/17/20  Yes Malvin Johns, MD  dicyclomine (BENTYL) 20 MG tablet Take 1 tablet (20 mg total) by mouth 3 (three) times daily as needed for spasms. 07/23/20   Long, Wonda Olds, MD  ibuprofen (ADVIL) 800 MG tablet Take 1 tablet (800 mg total) by mouth every 8 (eight) hours as needed for moderate pain. 07/23/20   Long, Wonda Olds, MD  morphine (MSIR) 15 MG tablet  Take 0.5 tablets (7.5 mg total) by mouth every 4 (four) hours as needed for severe pain. 02/07/20   Deno Etienne, DO  hyoscyamine (LEVSIN SL) 0.125 MG SL tablet Place 1 tablet (0.125 mg total) under the tongue every 6 (six) hours as needed. Patient not taking: Reported on 04/12/2018 08/08/17 03/19/19  Doran Stabler, MD  methscopolamine (PAMINE) 2.5 MG TABS tablet Take 1 tablet (2.5 mg total) by mouth 2 (two) times daily as needed. Patient not taking: Reported on 04/12/2018 09/23/17 03/19/19  Doran Stabler, MD    Allergies    Codeine  Review of Systems   Review of Systems  Constitutional:  Negative for fever.  Gastrointestinal:  Negative for nausea and vomiting.  Musculoskeletal:  Positive for arthralgias and joint swelling. Negative for back pain and neck pain.  Skin:  Positive for wound.  Neurological:  Negative for weakness, numbness and headaches.   Physical Exam Updated Vital Signs BP (!) 120/94 (BP Location: Right Arm)   Pulse 71   Temp 98.2 F (36.8 C) (Oral)   Resp 18    Ht 5\' 4"  (1.626 m)   Wt 75.8 kg   SpO2 100%   BMI 28.67 kg/m   Physical Exam Constitutional:      Appearance: She is well-developed.  HENT:     Head: Normocephalic and atraumatic.  Cardiovascular:     Rate and Rhythm: Normal rate.  Pulmonary:     Effort: Pulmonary effort is normal.  Musculoskeletal:        General: Tenderness present.     Cervical back: Normal range of motion and neck supple.     Comments: Patient has a paronychia to the right middle finger on the ulnar side.  There is a small amount of fluctuance.  No surrounding erythema.  No warmth or redness in the remainder of the finger.  Skin:    General: Skin is warm and dry.  Neurological:     Mental Status: She is alert and oriented to person, place, and time.    ED Results / Procedures / Treatments   Labs (all labs ordered are listed, but only abnormal results are displayed) Labs Reviewed - No data to display  EKG None  Radiology No results found.  Procedures .Marland KitchenIncision and Drainage  Date/Time: 09/17/2020 3:24 AM Performed by: Malvin Johns, MD Authorized by: Malvin Johns, MD   Consent:    Consent obtained:  Verbal   Consent given by:  Patient   Risks, benefits, and alternatives were discussed: yes   Location:    Type:  Abscess (paronychia)   Location:  Upper extremity   Upper extremity location:  Finger   Finger location:  R long finger Pre-procedure details:    Skin preparation:  Povidone-iodine Anesthesia:    Anesthesia method:  Nerve block   Block anesthetic:  Bupivacaine 0.5% w/o epi and lidocaine 1% w/o epi   Block technique:  Digital   Block injection procedure:  Anatomic landmarks identified, introduced needle, negative aspiration for blood and incremental injection   Block outcome:  Anesthesia achieved Procedure type:    Complexity:  Simple Procedure details:    Incision types:  Single straight   Incision depth:  Dermal   Wound management:  Probed and deloculated   Drainage:   Purulent   Drainage amount:  Moderate   Wound treatment:  Wound left open   Packing materials:  None Post-procedure details:    Procedure completion:  Tolerated well, no immediate complications .Nerve  Block  Date/Time: 09/17/2020 3:26 AM Performed by: Malvin Johns, MD Authorized by: Malvin Johns, MD   Consent:    Consent obtained:  Verbal   Consent given by:  Patient   Risks, benefits, and alternatives were discussed: yes   Indications:    Indications:  Procedural anesthesia and pain relief Location:    Body area:  Upper extremity   Upper extremity nerve:  Metacarpal   Laterality:  Right Pre-procedure details:    Skin preparation:  Chlorhexidine   Preparation: Patient was prepped and draped in usual sterile fashion   Procedure details:    Block needle gauge:  25 G   Anesthetic injected:  Bupivacaine 0.5% w/o epi and lidocaine 1% w/o epi   Additive injected:  None   Injection procedure:  Anatomic landmarks identified, incremental injection, negative aspiration for blood and introduced needle   Paresthesia:  Immediately resolved Post-procedure details:    Dressing:  None   Procedure completion:  Tolerated well, no immediate complications   Medications Ordered in ED Medications  bupivacaine (MARCAINE) 0.5 % injection 10 mL (has no administration in time range)  lidocaine (PF) (XYLOCAINE) 1 % injection 10 mL (has no administration in time range)    ED Course  I have reviewed the triage vital signs and the nursing notes.  Pertinent labs & imaging results that were available during my care of the patient were reviewed by me and considered in my medical decision making (see chart for details).    MDM Rules/Calculators/A&P                          Patient presents with a paronychia.  This was drained in the ED.  She was instructed in using warm compresses.  She was started on doxycycline.  Return precautions were given. Final Clinical Impression(s) / ED Diagnoses Final  diagnoses:  Paronychia of finger of right hand    Rx / DC Orders ED Discharge Orders          Ordered    doxycycline (VIBRAMYCIN) 100 MG capsule  2 times daily        09/17/20 0322             Malvin Johns, MD 09/17/20 0327

## 2020-09-18 ENCOUNTER — Telehealth: Payer: Self-pay

## 2020-09-18 NOTE — Telephone Encounter (Signed)
Transition Care Management Unsuccessful Follow-up Telephone Call  Date of discharge and from where:  09/17/2020 from [facility]  Attempts:  1st Attempt  Reason for unsuccessful TCM follow-up call:  Left voice message   Pt has a sign language interpreter service phone.

## 2020-09-19 NOTE — Telephone Encounter (Signed)
Transition Care Management Unsuccessful Follow-up Telephone Call  Date of discharge and from where:  09/17/2020 - [facility]  Attempts:  2nd Attempt  Reason for unsuccessful TCM follow-up call:  Left voice message

## 2020-09-22 NOTE — Telephone Encounter (Signed)
Transition Care Management Follow-up Telephone Call Date of discharge and from where: 09/17/2020 from [facility] How have you been since you were released from the hospital? Pt states that she is feeling better.  Any questions or concerns? No  Items Reviewed: Did the pt receive and understand the discharge instructions provided? Yes  Medications obtained and verified? Yes  Other? No  Any new allergies since your discharge? No  Dietary orders reviewed? No Do you have support at home? Yes    Answering service stated that the call dropped. We tried to reconnect but we were not able.

## 2020-09-30 ENCOUNTER — Encounter (HOSPITAL_BASED_OUTPATIENT_CLINIC_OR_DEPARTMENT_OTHER): Payer: Self-pay | Admitting: Obstetrics and Gynecology

## 2020-09-30 ENCOUNTER — Emergency Department (HOSPITAL_BASED_OUTPATIENT_CLINIC_OR_DEPARTMENT_OTHER)
Admission: EM | Admit: 2020-09-30 | Discharge: 2020-09-30 | Disposition: A | Discharge status: Home / Self Care | Payer: Medicaid Other | Attending: Emergency Medicine | Admitting: Emergency Medicine

## 2020-09-30 ENCOUNTER — Other Ambulatory Visit: Payer: Self-pay

## 2020-09-30 DIAGNOSIS — B001 Herpesviral vesicular dermatitis: Secondary | ICD-10-CM | POA: Diagnosis not present

## 2020-09-30 DIAGNOSIS — Z79899 Other long term (current) drug therapy: Secondary | ICD-10-CM | POA: Diagnosis not present

## 2020-09-30 DIAGNOSIS — I10 Essential (primary) hypertension: Secondary | ICD-10-CM | POA: Insufficient documentation

## 2020-09-30 DIAGNOSIS — K1379 Other lesions of oral mucosa: Secondary | ICD-10-CM | POA: Diagnosis not present

## 2020-09-30 MED ORDER — VALACYCLOVIR HCL 1 G PO TABS: 1000.0000 mg | ORAL_TABLET | Freq: Two times a day (BID) | ORAL | 0 refills | Status: AC

## 2020-09-30 NOTE — ED Triage Notes (Signed)
Patient reports to the ER for blister on the lip that started yesterday and patient reports is painful and irritated. Patient denies hx of herpes.

## 2020-09-30 NOTE — ED Notes (Signed)
Clarrissa [phone] with language line explained discharge.

## 2020-09-30 NOTE — Discharge Instructions (Addendum)
You can try Abreva OTC.

## 2020-09-30 NOTE — ED Provider Notes (Signed)
[facility] EM[facility] DEPT Provider Note   CSN: [id number] Arrival date & time: 09/30/20  1234     History Chief Complaint  Patient presents with  . Mouth Lesions    Cold Sore    Diana Torres is a 30 y.o. female.  The history is provided by the patient.  Mouth Lesions Location:  Upper lip Upper lip location:  L outer Quality:  Blistered Onset quality:  Sudden Severity:  Moderate Duration:  1 day Progression:  Worsening Chronicity:  New Context: stress   Context: not a change in diet, not a change in medications, not medications, not a possible infection and not trauma   Relieved by:  Nothing Worsened by:  Nothing Ineffective treatments:  None tried Associated symptoms: no congestion, no ear pain, no fever, no rash, no rhinorrhea and no sore throat       Past Medical History:  Diagnosis Date  . Deaf   . History of broken nose   . 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 Mass    cyst/left upper abd quad  . Miscarriage    pt states had difficulty with high BP prior to miscarriage and afterwards till infection was healed  . PONV (postoperative nausea and vomiting)   . UTI (lower urinary tract infection)     Patient Active Problem List   Diagnosis Date Noted  . Pelvic pain 03/21/2019  . Hearing impaired 08/14/2012    Past Surgical History:  Procedure Laterality Date  . COCHLEAR IMPLANT    . 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 LAPAROSCOPIC TUBAL LIGATION Bilateral 07/08/2016   Procedure: LAPAROSCOPIC TUBAL LIGATION WITH FILSHIE CLIPS;  Surgeon: Osborne Oman, MD;  Location: [facility];  Service: Gynecology;  Laterality: Bilateral;  . RHINOPLASTY    . right foot  2016   cyst removed  . TONSILLECTOMY       OB History     Gravida  3   Para  2   Term  2   Preterm      AB   1   Living  2      SAB  1   IAB      Ectopic      Multiple  0   Live Births  2           Family History  Problem Relation Age of Onset  . Hearing loss Mother   . Hypertension Mother   . Alcohol abuse Father   . Hearing loss Father   . Pancreatic cancer Maternal Grandmother     Social History   Tobacco Use  . Smoking status: Never  . Smokeless tobacco: Never  Vaping Use  . Vaping Use: Never used  Substance Use Topics  . Alcohol use: Yes    Comment: Social  . Drug use: No    Home Medications Prior to Admission medications   Medication Sig Start Date End Date Taking? Authorizing Provider  valACYclovir (VALTREX) 1000 MG tablet Take 1 tablet (1,000 mg total) by mouth 2 (two) times daily for 7 days. 09/30/20 10/07/20 Yes Arnaldo Natal, MD  dicyclomine (BENTYL) 20 MG tablet Take 1 tablet (20 mg total) by mouth 3 (three) times daily as needed for spasms. 07/23/20   Long, Wonda Olds, MD  doxycycline (VIBRAMYCIN) 100 MG capsule Take 1 capsule (100 mg total)  by mouth 2 (two) times daily. One po bid x 7 days 09/17/20   Malvin Johns, MD  ibuprofen (ADVIL) 800 MG tablet Take 1 tablet (800 mg total) by mouth every 8 (eight) hours as needed for moderate pain. 07/23/20   Long, Wonda Olds, MD  morphine (MSIR) 15 MG tablet Take 0.5 tablets (7.5 mg total) by mouth every 4 (four) hours as needed for severe pain. 02/07/20   Deno Etienne, DO  hyoscyamine (LEVSIN SL) 0.125 MG SL tablet Place 1 tablet (0.125 mg total) under the tongue every 6 (six) hours as needed. Patient not taking: Reported on 04/12/2018 08/08/17 03/19/19  Doran Stabler, MD  methscopolamine (PAMINE) 2.5 MG TABS tablet Take 1 tablet (2.5 mg total) by mouth 2 (two) times daily as needed. Patient not taking: Reported on 04/12/2018 09/23/17 03/19/19  Doran Stabler, MD    Allergies    Codeine  Review of Systems   Review of Systems  Constitutional:  Negative for chills and fever.  HENT:  Positive for mouth sores.  Negative for congestion, ear pain, rhinorrhea and sore throat.   Eyes:  Negative for pain and visual disturbance.  Respiratory:  Negative for cough and shortness of breath.   Cardiovascular:  Negative for chest pain and palpitations.  Gastrointestinal:  Negative for abdominal pain and vomiting.  Genitourinary:  Negative for dysuria and hematuria.  Musculoskeletal:  Negative for arthralgias and back pain.  Skin:  Negative for color change and rash.  Neurological:  Negative for seizures and syncope.  All other systems reviewed and are negative.  Physical Exam Updated Vital Signs BP 120/76 (BP Location: Right Arm)   Pulse 64   Temp 98.3 F (36.8 C)   Resp 18   Ht 5\' 4"  (1.626 m)   Wt 76 kg   LMP 09/09/2020 (Exact Date)   SpO2 100%   BMI 28.76 kg/m   Physical Exam Vitals and nursing note reviewed.  HENT:     Head: Normocephalic and atraumatic.     Mouth/Throat:     Comments: Grouped vesicles at the left upper lip consistent with herpes simplex Eyes:     General: No scleral icterus. Pulmonary:     Effort: Pulmonary effort is normal. No respiratory distress.  Musculoskeletal:     Cervical back: Normal range of motion.  Skin:    General: Skin is warm and dry.  Neurological:     General: No focal deficit present.     Mental Status: She is alert and oriented to person, place, and time.  Psychiatric:        Mood and Affect: Mood normal.    ED Results / Procedures / Treatments   Labs (all labs ordered are listed, but only abnormal results are displayed) Labs Reviewed - No data to display  EKG None  Radiology No results found.  Procedures Procedures   Medications Ordered in ED Medications - No data to display  ED Course  I have reviewed the triage vital signs and the nursing notes.  Pertinent labs & imaging results that were available during my care of the patient were reviewed by me and considered in my medical decision making (see chart for details).    MDM  Rules/Calculators/A&P                          Garald Balding presents with initial outbreak of herpes simplex labialis.  She was given treatment and counseled on symptomatic  management as well as management of any future outbreaks. Final Clinical Impression(s) / ED Diagnoses Final diagnoses:  Cold sore    Rx / DC Orders ED Discharge Orders          Ordered    valACYclovir (VALTREX) 1000 MG tablet  2 times daily        09/30/20 1305             Arnaldo Natal, MD 09/30/20 1308

## 2020-10-01 ENCOUNTER — Telehealth: Payer: Self-pay | Admitting: *Deleted

## 2020-10-01 NOTE — Telephone Encounter (Signed)
Transition Care Management Follow-up Telephone Call Date of discharge and from where: 09/30/2020 - [facility] How have you been since you were released from the hospital? "Doing a little better' Any questions or concerns? No  Items Reviewed: Did the pt receive and understand the discharge instructions provided? Yes  Medications obtained and verified? Yes  Other? No  Any new allergies since your discharge? No  Dietary orders reviewed? No Do you have support at home? Yes    Functional Questionnaire: (I = Independent and D = Dependent) ADLs: I  Bathing/Dressing- I  Meal Prep- I  Eating- I  Maintaining continence- I  Transferring/Ambulation- I  Managing Meds- I  Follow up appointments reviewed:  PCP Hospital f/u appt confirmed? No   Specialist Hospital f/u appt confirmed? No  Are transportation arrangements needed? No  If their condition worsens, is the pt aware to call PCP or go to the Emergency Dept.? Yes Was the patient provided with contact information for the PCP's office or ED? Yes Was to pt encouraged to call back with questions or concerns? Yes  Call was completed with Sign language interpreter phone system.

## 2020-11-21 DIAGNOSIS — Z03818 Encounter for observation for suspected exposure to other biological agents ruled out: Secondary | ICD-10-CM | POA: Diagnosis not present

## 2020-11-21 DIAGNOSIS — Z20822 Contact with and (suspected) exposure to covid-19: Secondary | ICD-10-CM | POA: Diagnosis not present

## 2020-11-24 DIAGNOSIS — H6502 Acute serous otitis media, left ear: Secondary | ICD-10-CM | POA: Diagnosis not present

## 2020-11-24 DIAGNOSIS — R059 Cough, unspecified: Secondary | ICD-10-CM | POA: Diagnosis not present

## 2020-11-24 DIAGNOSIS — Z20822 Contact with and (suspected) exposure to covid-19: Secondary | ICD-10-CM | POA: Diagnosis not present

## 2020-12-31 DIAGNOSIS — Z20822 Contact with and (suspected) exposure to covid-19: Secondary | ICD-10-CM | POA: Diagnosis not present

## 2020-12-31 DIAGNOSIS — Z03818 Encounter for observation for suspected exposure to other biological agents ruled out: Secondary | ICD-10-CM | POA: Diagnosis not present

## 2020-12-31 DIAGNOSIS — J00 Acute nasopharyngitis [common cold]: Secondary | ICD-10-CM | POA: Diagnosis not present

## 2021-09-29 ENCOUNTER — Ambulatory Visit: Payer: Medicaid Other | Admitting: Student

## 2021-09-29 ENCOUNTER — Encounter: Payer: Self-pay | Admitting: Student

## 2021-09-29 VITALS — BP 128/87 | HR 80 | Ht 64.0 in | Wt 169.8 lb

## 2021-09-29 DIAGNOSIS — N946 Dysmenorrhea, unspecified: Secondary | ICD-10-CM | POA: Diagnosis not present

## 2021-09-29 DIAGNOSIS — Z20822 Contact with and (suspected) exposure to covid-19: Secondary | ICD-10-CM | POA: Diagnosis not present

## 2021-09-29 DIAGNOSIS — J019 Acute sinusitis, unspecified: Secondary | ICD-10-CM | POA: Diagnosis not present

## 2021-09-29 DIAGNOSIS — R519 Headache, unspecified: Secondary | ICD-10-CM | POA: Diagnosis not present

## 2021-09-29 DIAGNOSIS — R102 Pelvic and perineal pain: Secondary | ICD-10-CM

## 2021-09-29 DIAGNOSIS — B9689 Other specified bacterial agents as the cause of diseases classified elsewhere: Secondary | ICD-10-CM | POA: Diagnosis not present

## 2021-09-29 NOTE — Progress Notes (Signed)
History:  [name] is a 31 y.o. [id number] who presents to clinic today for bilaterally lower abdominal pain since tubal ligation (2018). ASL interpreter is at the bedside. Abdominal pain is intermittent and not associated with periods. Patient notes tenderness to pressure or touch on both sides. Currently, she feels that the left side is hurting more than the other. Reports increased passage of clots and cramping with periods for >4 years. However, has noticed these symptoms worsening in the last 4 months. Pain unresolved by OTC analgesics.  Patient is wondering if developing a "allergy" to surgical pieces from tubal ligation.  ASL interpreter assisting at bedside with care and coordination of visit.   The following portions of the patient's history were reviewed and updated as appropriate: allergies, current medications, family history, past medical history, social history, past surgical history and problem list.  Review of Systems:  Review of Systems  Constitutional: Negative.  Negative for chills, fever and weight loss.  HENT:  Positive for hearing loss.   Respiratory: Negative.    Cardiovascular:  Negative for chest pain and palpitations.  Gastrointestinal:  Positive for abdominal pain. Negative for constipation, diarrhea, nausea and vomiting.  Genitourinary:  Negative for flank pain, frequency, hematuria and urgency.       Denies dysuria, discharge, or vaginal bleeding  Skin: Negative.   Neurological: Negative.   Psychiatric/Behavioral: Negative.    All other systems reviewed and are negative.     Objective:  Physical Exam  BP 128/87   Pulse 80   Ht 5\' 4"  (1.626 m)   Wt 169 lb 12.8 oz (77 kg)   LMP 09/22/2021 (Approximate)   BMI 29.15 kg/m  CONSTITUTIONAL: Well-developed, well-nourished female in no acute distress.  HENT:  Normocephalic, atraumatic SKIN: Skin is warm and dry. No rash noted. Not diaphoretic. No erythema. No pallor. NEUROLOGIC: Alert and oriented to  person, place, and time.  PSYCHIATRIC: Normal mood and affect. Normal behavior. Normal judgment and thought content. CARDIOVASCULAR: Normal heart rate noted, regular rhythm RESPIRATORY: Clear to auscultation bilaterally. Effort and breath sounds normal, no problems with respiration noted. ABDOMEN: Soft, no distention noted.  No rebound or guarding. + Tenderness bilateral lower quadrants PELVIC: Normal appearing external genitalia and urethral meatus; normal appearing vaginal mucosa and cervix.  No abnormal discharge noted.  Pap smear not indicated. Normal uterine size, no other palpable masses, + adnexal tenderness  Labs and Imaging No results found for this or any previous visit (from the past 24 hour(s)).  No results found.  Health Maintenance Due  Topic Date Due   COVID-19 Vaccine (1) Never done   Hepatitis C Screening  Never done    Labs, imaging and previous visits in Epic and Care Everywhere reviewed  Assessment & Plan:  1. Pelvic pain - Discussed follow-up options with patient. Patient in agreement with follow-up TVUS. Discussed other potential sources of pain such as scar tissue or benign cysts. Reassured patient that it is unlikely that patient is having a reaction or sensitivity to Filshie Clips.  - Offered STI testing - patient declined - US PELVIC COMPLETE WITH TRANSVAGINAL; Future  2. Dysmenorrhea - Counseled on different options to manage painful cramping and heavy bleeding during periods. Counseled patient on  contraceptive options, OCP vs. IUD. Patient prefers to await TVUS results before intervening further.  Approximately 30 minutes of total time was spent with this patient on counseling and coordination of care with assistance of ASL interpreter.   Corlis Hove, NP 09/29/2021 5:16 PM

## 2021-09-30 ENCOUNTER — Ambulatory Visit (HOSPITAL_BASED_OUTPATIENT_CLINIC_OR_DEPARTMENT_OTHER): Admission: RE | Admit: 2021-09-30 | Payer: Medicaid Other | Source: Ambulatory Visit

## 2021-10-07 ENCOUNTER — Ambulatory Visit (HOSPITAL_BASED_OUTPATIENT_CLINIC_OR_DEPARTMENT_OTHER): Payer: Medicaid Other

## 2021-10-29 ENCOUNTER — Ambulatory Visit: Payer: Medicaid Other | Admitting: Student

## 2021-12-10 ENCOUNTER — Encounter: Payer: Self-pay | Admitting: Family Medicine

## 2021-12-10 ENCOUNTER — Other Ambulatory Visit (HOSPITAL_COMMUNITY)
Admission: RE | Admit: 2021-12-10 | Discharge: 2021-12-10 | Disposition: A | Discharge status: Home / Self Care | Payer: Medicaid Other | Source: Ambulatory Visit | Attending: Family Medicine | Admitting: Family Medicine

## 2021-12-10 ENCOUNTER — Ambulatory Visit (INDEPENDENT_AMBULATORY_CARE_PROVIDER_SITE_OTHER): Payer: Medicaid Other | Admitting: Family Medicine

## 2021-12-10 VITALS — BP 108/75 | HR 69 | Ht 64.0 in | Wt 168.0 lb

## 2021-12-10 DIAGNOSIS — N898 Other specified noninflammatory disorders of vagina: Secondary | ICD-10-CM

## 2021-12-10 DIAGNOSIS — R102 Pelvic and perineal pain: Secondary | ICD-10-CM

## 2021-12-10 DIAGNOSIS — Z124 Encounter for screening for malignant neoplasm of cervix: Secondary | ICD-10-CM | POA: Insufficient documentation

## 2021-12-10 DIAGNOSIS — G44209 Tension-type headache, unspecified, not intractable: Secondary | ICD-10-CM

## 2021-12-10 DIAGNOSIS — Z0000 Encounter for general adult medical examination without abnormal findings: Secondary | ICD-10-CM | POA: Diagnosis not present

## 2021-12-10 LAB — HM PAP SMEAR: HM Pap smear: HIGH

## 2021-12-10 NOTE — Assessment & Plan Note (Addendum)
-   pt has concerns of pelvic pain that happened after her tubes tied. Recommended she go to gynecology to address this with them. No obvious abnormalities or masses seen on exam.

## 2021-12-10 NOTE — Assessment & Plan Note (Addendum)
-   headaches described sound like tension in nature. Have recommended she get her eyes checked to see if this is the cause. Recommended adequate sleep, hydration - recommended to keep track of headaches and if more recurrent after eye exam is clear can consider amitriptyline pxp

## 2021-12-10 NOTE — Progress Notes (Signed)
Annual Wellness Visit     Patient: Diana Torres, Female    DOB: 05-Oct-1990, 31 y.o.   MRN: [id number]  Subjective  Chief Complaint  Patient presents with   Annual Exam    Gayatri Teasdale is a 31 y.o. female sign language patient who presents with interpretor to aid in translation of this visit. She presents today for her Annual Wellness Visit. She reports consuming a general diet.  She works for [employer] and gets exercise during work.  She generally feels well. She reports sleeping fairly well.   HPI Headaches She reports tension headaches that occur. She will take tylenol, excedrin, ibuprofen and has noted the headache coming back. Denies vomiting with the headaches. Does admit to some nausea. Says they are coming 3-4x a week. She does note frequent squinting and has not been to an eye doctor in a few years.     Vision:Not within last year  and Dental: No current dental problems and Last dental visit: 5 years ago.    Pap smear: 2020; negative  Family hx HTN: mom  DM: dad   Two children (51yrZayne and 564yrmma)  Alcohol use: no  Smoking: no Illicity drug use: no   Medications: Outpatient Medications Prior to Visit  Medication Sig   [DISCONTINUED] dicyclomine (BENTYL) 20 MG tablet Take 1 tablet (20 mg total) by mouth 3 (three) times daily as needed for spasms. (Patient not taking: Reported on 09/29/2021)   [DISCONTINUED] doxycycline (VIBRAMYCIN) 100 MG capsule Take 1 capsule (100 mg total) by mouth 2 (two) times daily. One po bid x 7 days (Patient not taking: Reported on 09/29/2021)   [DISCONTINUED] ibuprofen (ADVIL) 800 MG tablet Take 1 tablet (800 mg total) by mouth every 8 (eight) hours as needed for moderate pain. (Patient not taking: Reported on 09/29/2021)   [DISCONTINUED] morphine (MSIR) 15 MG tablet Take 0.5 tablets (7.5 mg total) by mouth every 4 (four) hours as needed for severe pain. (Patient not taking: Reported on 09/29/2021)   No facility-administered medications  prior to visit.    Allergies  Allergen Reactions   Codeine Itching and Rash    Patient Care Team: Patient, No Pcp Per as PCP - General (General Practice)  Review of Systems  Constitutional:  Negative for chills and fever.  Respiratory:  Negative for cough and shortness of breath.   Cardiovascular:  Negative for chest pain.  Neurological:  Negative for headaches.      Objective  BP 108/75   Pulse 69   Ht '5\' 4"'$  (1.626 m)   Wt 168 lb (76.2 kg)   SpO2 99%   BMI 28.84 kg/m    Physical Exam Vitals and nursing note reviewed.  Constitutional:      General: She is not in acute distress.    Appearance: Normal appearance.  HENT:     Head: Normocephalic and atraumatic.     Right Ear: External ear normal.     Left Ear: External ear normal.     Nose: Nose normal.  Eyes:     Conjunctiva/sclera: Conjunctivae normal.  Cardiovascular:     Rate and Rhythm: Normal rate and regular rhythm.  Pulmonary:     Effort: Pulmonary effort is normal.     Breath sounds: Normal breath sounds.  Abdominal:     General: Abdomen is flat. Bowel sounds are normal.     Palpations: Abdomen is soft.     Tenderness: There is no abdominal tenderness.  Genitourinary:    Comments: Chaperone present during  exam. Pap smear performed. No adnexal masses or friablity of the cervix noted. Copious vaginal discharge. Wet prep sample taken.  Musculoskeletal:     Comments: 5/5 upper and lower extremities bilaterally  Neurological:     General: No focal deficit present.     Mental Status: She is alert and oriented to person, place, and time.  Psychiatric:        Mood and Affect: Mood normal.        Behavior: Behavior normal.        Thought Content: Thought content normal.        Judgment: Judgment normal.     No results found for any visits on 12/10/21.    Assessment & Plan   Annual wellness visit done today including the all of the following: Reviewed patient's Family Medical History Reviewed and  updated list of patient's medical providers Assessment of cognitive impairment was done Assessed patient's functional ability Established a written schedule for health screening [place] Completed and Reviewed  Exercise Activities and Dietary recommendations  Goals   None     Immunization History  Administered Date(s) Administered   Influenza Split 01/16/2008, 08/14/2012   Influenza,inj,Quad PF,6+ Mos 01/07/2016, 04/12/2018   Meningococcal Conjugate 10/16/2010   Pneumococcal Polysaccharide-23 01/03/2005   Td 09/17/2004   Tdap 10/16/2010, 08/14/2012, 03/03/2016   Varicella 10/16/2010    Health Maintenance  Topic Date Due   COVID-19 Vaccine (1) Never done   Hepatitis C Screening  Never done   INFLUENZA VACCINE  07/04/2022 (Originally 11/03/2021)   PAP SMEAR-Modifier  03/19/2022   TETANUS/TDAP  03/03/2026   HIV Screening  Completed   HPV VACCINES  Aged Out     Discussed health benefits of physical activity, and encouraged her to engage in regular exercise appropriate for her age and condition.    Problem List Items Addressed This Visit       Other   Pelvic pain    - pt has concerns of pelvic pain that happened after her tubes tied. Recommended she go to gynecology to address this with them. No obvious abnormalities or masses seen on exam.        Routine health maintenance - Primary    - pap smear done today for cervical cancer screening today with HPV testing, due in 5 years in 2027 - CBC, lipid, cmp ordered  - encouraged diet and exercise      Relevant Orders   Lipid panel   COMPLETE METABOLIC PANEL WITH GFR   CBC   Vaginal discharge   Relevant Orders   WET PREP FOR TRICH, YEAST, CLUE   Tension headache    - headaches described sound like tension in nature. Have recommended she get her eyes checked to see if this is the cause. Recommended adequate sleep, hydration - recommended to keep track of headaches and if more recurrent after eye  exam is clear can consider amitriptyline pxp        Other Visit Diagnoses     Screening for cervical cancer       Relevant Orders   Cytology - PAP       No follow-ups on file.     Owens Loffler, DO

## 2021-12-10 NOTE — Assessment & Plan Note (Signed)
-   pap smear done today for cervical cancer screening today with HPV testing, due in 5 years in 2027 - CBC, lipid, cmp ordered  - encouraged diet and exercise

## 2021-12-11 LAB — COMPLETE METABOLIC PANEL WITH GFR
AG Ratio: 1.7 (calc) (ref 1.0–2.5)
ALT: 8 U/L (ref 6–29)
AST: 10 U/L (ref 10–30)
Albumin: 4.3 g/dL (ref 3.6–5.1)
Alkaline phosphatase (APISO): 53 U/L (ref 31–125)
BUN: 9 mg/dL (ref 7–25)
CO2: 29 mmol/L (ref 20–32)
Calcium: 9.6 mg/dL (ref 8.6–10.2)
Chloride: 103 mmol/L (ref 98–110)
Creat: 0.74 mg/dL (ref 0.50–0.97)
Globulin: 2.6 g/dL (calc) (ref 1.9–3.7)
Glucose, Bld: 75 mg/dL (ref 65–99)
Potassium: 4.6 mmol/L (ref 3.5–5.3)
Sodium: 138 mmol/L (ref 135–146)
Total Bilirubin: 0.5 mg/dL (ref 0.2–1.2)
Total Protein: 6.9 g/dL (ref 6.1–8.1)
eGFR: 112 mL/min/{1.73_m2} (ref >=60)

## 2021-12-11 LAB — CBC
HCT: 38 % (ref 35.0–45.0)
Hemoglobin: 12.4 g/dL (ref 11.7–15.5)
MCH: 29.2 pg (ref 27.0–33.0)
MCHC: 32.6 g/dL (ref 32.0–36.0)
MCV: 89.6 fL (ref 80.0–100.0)
MPV: 10.7 fL (ref 7.5–12.5)
Platelets: 270 10*3/uL (ref 140–400)
RBC: 4.24 10*6/uL (ref 3.80–5.10)
RDW: 12.3 % (ref 11.0–15.0)
WBC: 7.4 10*3/uL (ref 3.8–10.8)

## 2021-12-11 LAB — LIPID PANEL
Cholesterol: 172 mg/dL (ref <200)
HDL: 49 mg/dL — ABNORMAL LOW (ref >=50)
LDL Cholesterol (Calc): 109 mg/dL (calc) — ABNORMAL HIGH
Non-HDL Cholesterol (Calc): 123 mg/dL (calc) (ref <130)
Total CHOL/HDL Ratio: 3.5 (calc) (ref <5.0)
Triglycerides: 58 mg/dL (ref <150)

## 2021-12-14 LAB — LIPID PANEL

## 2021-12-14 LAB — WET PREP FOR TRICH, YEAST, CLUE
MICRO NUMBER:: 13885158
Specimen Quality: ADEQUATE

## 2021-12-14 LAB — COMPLETE METABOLIC PANEL WITHOUT GFR

## 2021-12-14 LAB — CBC

## 2021-12-16 ENCOUNTER — Other Ambulatory Visit: Payer: Self-pay | Admitting: Family Medicine

## 2021-12-16 DIAGNOSIS — R87612 Low grade squamous intraepithelial lesion on cytologic smear of cervix (LGSIL): Secondary | ICD-10-CM

## 2021-12-16 DIAGNOSIS — R87618 Other abnormal cytological findings on specimens from cervix uteri: Secondary | ICD-10-CM

## 2021-12-16 LAB — CYTOLOGY - PAP
Comment: NEGATIVE
Comment: NEGATIVE
Comment: NEGATIVE
HPV 16: NEGATIVE
HPV 18 / 45: NEGATIVE
High risk HPV: POSITIVE — AB

## 2021-12-16 NOTE — Progress Notes (Signed)
Pap came back abnormal. Have notified pt and sent referral to gynecology for colposcopy.

## 2021-12-19 ENCOUNTER — Encounter: Payer: Self-pay | Admitting: Family Medicine

## 2021-12-21 ENCOUNTER — Telehealth: Payer: Self-pay | Admitting: Family Medicine

## 2021-12-21 DIAGNOSIS — Z45321 Encounter for adjustment and management of cochlear device: Secondary | ICD-10-CM | POA: Diagnosis not present

## 2021-12-21 DIAGNOSIS — H903 Sensorineural hearing loss, bilateral: Secondary | ICD-10-CM | POA: Diagnosis not present

## 2021-12-21 NOTE — Telephone Encounter (Signed)
Patient called in and stated she is in a lot of pain. She does not have her appt with GYN until 9/28 and wanted to ask Dr. Mel Almond if she would send something for pain until then.

## 2021-12-21 NOTE — Telephone Encounter (Signed)
I did explain that to patient while she was on the phone but she stated she rather speak with you first or see what you could do.

## 2021-12-22 ENCOUNTER — Encounter: Payer: Self-pay | Admitting: Family Medicine

## 2021-12-28 NOTE — Progress Notes (Unsigned)
GYNECOLOGY OFFICE VISIT NOTE  History:   Diana Torres is a 31 y.o. [id number] here today for colposcopy but has a few other concerns.   She has had intermittent but regular pelvic pain since getting her tubes tied. She would like to have it reversed.   She also is going through a separation with her husband and would like to have STI testing. She also has some recent vaginal itching and burning that maybe started today.   She has not had the Gardasil vaccine.     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   Procedure: LAPAROSCOPIC SPLEEN SPARING AND DISTAL PANCRETECTOMY ;  Surgeon: Stark Klein, MD;  Location: [facility];  Service: General;  Laterality: N/A;   LAPAROSCOPIC TUBAL LIGATION Bilateral 07/08/2016   Procedure: LAPAROSCOPIC TUBAL LIGATION WITH FILSHIE CLIPS;  Surgeon: Osborne Oman, MD;  Location: [facility];  Service: Gynecology;  Laterality: Bilateral;   RHINOPLASTY     right foot  2016   cyst removed   TONSILLECTOMY      The following portions of the patient's history were reviewed and updated as appropriate: allergies, current medications, past family history, past medical history, past social history, past surgical history and problem list.   Health Maintenance:     Diagnosis  Date Value Ref Range Status  12/10/2021 - Low grade squamous intraepithelial lesion (LSIL) (A)  Final  HPV pos   Review of Systems:  Pertinent items noted in HPI and remainder of comprehensive ROS otherwise negative.  Physical Exam:  BP (!) 125/90   Pulse 94    Resp 16   Ht '5\' 4"'$  (1.626 m)   Wt 168 lb (76.2 kg)   LMP 12/22/2021   BMI 28.84 kg/m  CONSTITUTIONAL: Well-developed, well-nourished female in no acute distress.  HEENT:  Normocephalic, atraumatic. External right and left ear normal. No scleral icterus.  NECK: Normal range of motion, supple, no masses noted on observation SKIN: No rash noted. Not diaphoretic. No erythema. No pallor. MUSCULOSKELETAL: Normal range of motion. No edema noted. NEUROLOGIC: Alert and oriented to person, place, and time. Normal muscle tone coordination. No cranial nerve deficit noted. PSYCHIATRIC: Normal mood and affect. Normal behavior. Normal judgment and thought content.  CARDIOVASCULAR: Normal heart rate noted RESPIRATORY: Effort and breath sounds normal, no problems with respiration noted ABDOMEN: No masses noted. No other overt distention noted.    PELVIC: Normal appearing external genitalia; normal urethral meatus; normal appearing cervix; vaginal mucosa red at introitus. Bilateral levator tenderness L>R No abnormal discharge noted.  Normal uterine size, no other palpable masses, no uterine or adnexal tenderness. Performed in the presence of a chaperone  Labs and Imaging Results for orders placed or performed in visit on 12/31/21 (from the past 168 hour(s))  POCT urine pregnancy   Collection Time: 12/31/21  4:27 PM  Result Value Ref Range   Preg Test, Ur Negative Negative    GYNECOLOGY OFFICE COLPOSCOPY PROCEDURE NOTE  31 y.o. [id number] here for  colposcopy for low-grade squamous intraepithelial neoplasia (LGSIL - encompassing HPV,mild dysplasia,CIN I) pap smear on 12/2021.   Pregnancy test:  negative Gardasil:  not yet had, accepts. Discussed role for HPV in cervical dysplasia, need for surveillance.  Informed consent and review of risks, benefit and alternatives performed. Written consent given.   Speculum inserted into patient's vagina assuring full view of cervix and vaginal walls. 3 swabs of  vinegar solution applied to the cervix and vaginal walls and colposcope was used to observe both the cervix and vaginal walls.   Colposcopy adequate? Yes  no visible lesions, no mosaicism, no punctation, and no abnormal vasculature;  ECC collected.  All specimens were labeled and sent to pathology.  Speculum removed.  Pt tolerated well with minimal pain and bleeding.    Assessment and Plan:   1. Abnormal cervical Papanicolaou smear, unspecified abnormal pap finding ECC done today. Anticipate 1 yr follow up . Vaccine series initiated.  - POCT urine pregnancy - Surgical pathology( [place]/ [facility])  2. Vaginal discharge Will hold on empiric therapy.  - Cervicovaginal ancillary only( [place])  3. Routine screening for STI (sexually transmitted infection) Per pt request - Cervicovaginal ancillary only( ) - HIV antibody (with reflex) - Hepatitis C Antibody - RPR - Hepatitis B Surface AntiGEN  4. Immunization due - HPV 9-valent vaccine,Recombinat  5. Pelvic floor dysfunction - Ambulatory referral to Physical Therapy  Sign language interpreter used throughout our visit.   Routine preventative health maintenance measures emphasized. Please refer to After Visit Summary for other counseling recommendations.   No follow-ups on file.  Radene Gunning, MD, [place] for [facility], [place]

## 2021-12-31 ENCOUNTER — Ambulatory Visit: Payer: Medicaid Other | Admitting: Obstetrics and Gynecology

## 2021-12-31 ENCOUNTER — Other Ambulatory Visit (HOSPITAL_COMMUNITY)
Admission: RE | Admit: 2021-12-31 | Discharge: 2021-12-31 | Disposition: A | Discharge status: Home / Self Care | Payer: Medicaid Other | Source: Ambulatory Visit | Attending: Obstetrics and Gynecology | Admitting: Obstetrics and Gynecology

## 2021-12-31 ENCOUNTER — Encounter: Payer: Self-pay | Admitting: Obstetrics and Gynecology

## 2021-12-31 VITALS — BP 125/90 | HR 94 | Resp 16 | Ht 64.0 in | Wt 168.0 lb

## 2021-12-31 DIAGNOSIS — R87612 Low grade squamous intraepithelial lesion on cytologic smear of cervix (LGSIL): Secondary | ICD-10-CM

## 2021-12-31 DIAGNOSIS — M6289 Other specified disorders of muscle: Secondary | ICD-10-CM | POA: Diagnosis not present

## 2021-12-31 DIAGNOSIS — R87619 Unspecified abnormal cytological findings in specimens from cervix uteri: Secondary | ICD-10-CM | POA: Diagnosis not present

## 2021-12-31 DIAGNOSIS — Z01812 Encounter for preprocedural laboratory examination: Secondary | ICD-10-CM

## 2021-12-31 DIAGNOSIS — N898 Other specified noninflammatory disorders of vagina: Secondary | ICD-10-CM

## 2021-12-31 DIAGNOSIS — Z113 Encounter for screening for infections with a predominantly sexual mode of transmission: Secondary | ICD-10-CM | POA: Diagnosis not present

## 2021-12-31 DIAGNOSIS — Z23 Encounter for immunization: Secondary | ICD-10-CM | POA: Diagnosis not present

## 2021-12-31 DIAGNOSIS — R87810 Cervical high risk human papillomavirus (HPV) DNA test positive: Secondary | ICD-10-CM | POA: Diagnosis not present

## 2021-12-31 LAB — POCT URINE PREGNANCY: Preg Test, Ur: NEGATIVE

## 2021-12-31 NOTE — Patient Instructions (Signed)
Website for tubal reversal:  Tubal Ligation Reversal Surgery, IVF, [facility] [place] ([email or web address])   They are located in [facility] area.

## 2022-01-01 DIAGNOSIS — Z113 Encounter for screening for infections with a predominantly sexual mode of transmission: Secondary | ICD-10-CM | POA: Diagnosis not present

## 2022-01-04 LAB — CERVICOVAGINAL ANCILLARY ONLY
Bacterial Vaginitis (gardnerella): POSITIVE — AB
Candida Glabrata: NEGATIVE
Candida Vaginitis: NEGATIVE
Chlamydia: NEGATIVE
Comment: NEGATIVE
Comment: NEGATIVE
Comment: NEGATIVE
Comment: NEGATIVE
Comment: NEGATIVE
Comment: NORMAL
Neisseria Gonorrhea: NEGATIVE
Trichomonas: NEGATIVE

## 2022-01-04 LAB — HIV ANTIBODY (ROUTINE TESTING W REFLEX): HIV 1&2 Ab, 4th Generation: NONREACTIVE

## 2022-01-04 LAB — HEPATITIS B SURFACE ANTIGEN: Hepatitis B Surface Ag: NONREACTIVE

## 2022-01-04 LAB — HEPATITIS C ANTIBODY: Hepatitis C Ab: NONREACTIVE

## 2022-01-04 LAB — SURGICAL PATHOLOGY

## 2022-01-04 LAB — RPR: RPR Ser Ql: NONREACTIVE

## 2022-01-05 ENCOUNTER — Other Ambulatory Visit: Payer: Self-pay | Admitting: *Deleted

## 2022-01-05 MED ORDER — METRONIDAZOLE 500 MG PO TABS: 500.0000 mg | ORAL_TABLET | Freq: Two times a day (BID) | ORAL | 0 refills | Status: DC

## 2022-01-05 NOTE — Progress Notes (Signed)
Pt notified of positive BV and her surgical path was benign.  Flagyl was sent to [facility] in [facility].

## 2022-02-01 ENCOUNTER — Encounter: Payer: Self-pay | Admitting: Emergency Medicine

## 2022-02-01 ENCOUNTER — Ambulatory Visit
Admission: EM | Admit: 2022-02-01 | Discharge: 2022-02-01 | Disposition: A | Discharge status: Home / Self Care | Payer: Medicaid Other | Attending: Family Medicine | Admitting: Family Medicine

## 2022-02-01 DIAGNOSIS — N611 Abscess of the breast and nipple: Secondary | ICD-10-CM

## 2022-02-01 DIAGNOSIS — L739 Follicular disorder, unspecified: Secondary | ICD-10-CM

## 2022-02-01 MED ORDER — SULFAMETHOXAZOLE-TRIMETHOPRIM 800-160 MG PO TABS: 1.0000 | ORAL_TABLET | Freq: Two times a day (BID) | ORAL | 0 refills | Status: AC

## 2022-02-01 NOTE — Discharge Instructions (Addendum)
Advised patient to take medication as directed with food to completion.  Encouraged patient to increase daily water intake to 64 ounces per day while taking this medication.  Advised patient if abscess worsens and/or unresolved please follow-up with [facility] surgery for further evaluation.  [address], [place] [place], [place], [place] [address] [phone]

## 2022-02-01 NOTE — ED Triage Notes (Signed)
Pt is deaf, interpreter [phone] used. Pt states she noticed a bump under her left breast about 1 week ago and states it is getting more painful now.

## 2022-02-01 NOTE — ED Provider Notes (Signed)
[name]    CSN: [id number] Arrival date & time: 02/01/22  1439      History   Chief Complaint Chief Complaint  Patient presents with   Abscess    HPI Diana Torres is a 31 y.o. female.   HPI Pleasant 31 year old female presents with a left breast abscess that developed 1 week ago and is getting more painful.  PMH significant for deafness, labile hypertension, and obesity.  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   Procedure: LAPAROSCOPIC SPLEEN SPARING AND DISTAL PANCRETECTOMY ;  Surgeon: Stark Klein, MD;  Location: [facility];  Service: General;  Laterality: N/A;   LAPAROSCOPIC TUBAL LIGATION Bilateral 07/08/2016   Procedure: LAPAROSCOPIC TUBAL LIGATION WITH FILSHIE CLIPS;  Surgeon: Osborne Oman, MD;  Location: [facility];  Service: Gynecology;  Laterality: Bilateral;   RHINOPLASTY     right foot  2016   cyst removed   TONSILLECTOMY      OB History     Gravida  3   Para  2   Term  2   Preterm      AB  1   Living  2      SAB  1   IAB      Ectopic      Multiple  0   Live Births  2            Home Medications    Prior to Admission medications   Medication Sig Start Date End Date Taking? Authorizing Provider  sulfamethoxazole-trimethoprim (BACTRIM DS) 800-160 MG tablet Take 1 tablet by mouth 2 (two) times daily for 10 days. 02/01/22 02/11/22 Yes  Eliezer Lofts, FNP  metroNIDAZOLE (FLAGYL) 500 MG tablet Take 1 tablet (500 mg total) by mouth 2 (two) times daily. 01/05/22   Radene Gunning, MD  hyoscyamine (LEVSIN SL) 0.125 MG SL tablet Place 1 tablet (0.125 mg total) under the tongue every 6 (six) hours as needed. Patient not taking: Reported on 04/12/2018 08/08/17 03/19/19  Doran Stabler, MD  methscopolamine (PAMINE) 2.5 MG TABS tablet Take 1 tablet (2.5 mg total) by mouth 2 (two) times daily as needed. Patient not taking: Reported on 04/12/2018 09/23/17 03/19/19  Doran Stabler, MD    Family History Family History  Problem Relation Age of Onset   Hearing loss Mother    Hypertension Mother    Alcohol abuse Father    Hearing loss Father    Pancreatic cancer Maternal Grandmother     Social History Social History   Tobacco Use   Smoking status: Never   Smokeless tobacco: Never  Vaping Use   Vaping Use: Never used  Substance Use Topics   Alcohol use: Yes    Comment: Social   Drug use: No     Allergies   Codeine  Review of Systems Review of Systems  Skin:        Abscess of left breast for 7 days     Physical Exam Triage Vital Signs ED Triage Vitals  Enc Vitals Group     BP 02/01/22 1500 (!) 141/90     Pulse Rate 02/01/22 1500 81     Resp 02/01/22 1500 16     Temp 02/01/22 1500 98.9 F (37.2 C)     Temp Source 02/01/22 1500 Oral     SpO2 02/01/22 1500 99 %     Weight --      Height --      Head Circumference --      Peak Flow --      Pain Score 02/01/22 1503 9     Pain Loc --      Pain Edu? --      Excl. in [place]? --    No data found.  Updated Vital Signs BP (!) 141/90 (BP Location: Right Arm)   Pulse 81   Temp 98.9 F (37.2 C) (Oral)   Resp 16   SpO2 99%    Physical Exam Vitals and nursing note reviewed. Exam conducted with a chaperone present.  Constitutional:      Appearance: Normal appearance. She is obese.  HENT:     Head: Normocephalic and atraumatic.     Nose: Nose normal.      Mouth/Throat:     Mouth: Mucous membranes are moist.     Pharynx: Oropharynx is clear.  Eyes:     Extraocular Movements: Extraocular movements intact.     Conjunctiva/sclera: Conjunctivae normal.     Pupils: Pupils are equal, round, and reactive to light.  Cardiovascular:     Rate and Rhythm: Normal rate and regular rhythm.     Pulses: Normal pulses.     Heart sounds: Normal heart sounds. No murmur heard. Pulmonary:     Effort: Pulmonary effort is normal.     Breath sounds: Normal breath sounds. No wheezing, rhonchi or rales.  Abdominal:     General: Bowel sounds are normal.  Musculoskeletal:        General: Normal range of motion.     Cervical back: Normal range of motion and neck supple.  Skin:    General: Skin is warm and dry.     Comments: Left breast (medial volar aspect): Erythematous abscess noted-please see image below  Neurological:     General: No focal deficit present.     Mental Status: She is alert and oriented to person, place, and time.       UC Treatments / Results  Labs (all labs ordered are listed, but only abnormal results are displayed) Labs Reviewed - No data to display  EKG   Radiology No results found.  Procedures Procedures (including critical care time)  Medications Ordered in UC Medications - No data to display  Initial Impression / Assessment and Plan / UC Course  I have reviewed the triage vital signs and the nursing notes.  Pertinent labs & imaging results that were available during my care of the patient were reviewed by me and considered in my medical decision making (see chart for details).     MDM: 1.  Abscess of left breast-Rx'd Bactrim. Advised patient to take medication as directed with food to completion.  Encouraged patient to increase daily water intake to 64 ounces per day while taking this medication.  Advised patient if abscess worsens and/or unresolved please follow-up  with [facility] surgery for  further evaluation.  [address], [address], [place], [place] [address] [phone] Final Clinical Impressions(s) / UC Diagnoses   Final diagnoses:  Folliculitis  Abscess of left breast     Discharge Instructions      Advised patient to take medication as directed with food to completion.  Encouraged patient to increase daily water intake to 64 ounces per day while taking this medication.  Advised patient if abscess worsens and/or unresolved please follow-up with [facility] surgery for further evaluation.  [address], [place], [place] [address] [phone]     ED Prescriptions     Medication Sig Dispense Auth. Provider   sulfamethoxazole-trimethoprim (BACTRIM DS) 800-160 MG tablet Take 1 tablet by mouth 2 (two) times daily for 10 days. 20 tablet Eliezer Lofts, FNP      PDMP not reviewed this encounter.   Eliezer Lofts, [place] 02/01/22 1547

## 2022-03-02 ENCOUNTER — Ambulatory Visit: Payer: Medicaid Other

## 2022-03-03 ENCOUNTER — Ambulatory Visit: Payer: Medicaid Other

## 2022-03-04 ENCOUNTER — Ambulatory Visit (INDEPENDENT_AMBULATORY_CARE_PROVIDER_SITE_OTHER): Payer: Medicaid Other

## 2022-03-04 DIAGNOSIS — Z23 Encounter for immunization: Secondary | ICD-10-CM | POA: Diagnosis not present

## 2022-03-04 NOTE — Progress Notes (Signed)
Pt here for Gardasil injection. Interpreter present. Injection given and tolerated well. Pt will return for 3rd injection 07/01/22.

## 2022-05-11 NOTE — Progress Notes (Unsigned)
GYNECOLOGY OFFICE VISIT NOTE  History:   Diana Torres is a 32 y.o. [id number] here today for worsened pelvic pain for 2 weeks. She is taking the Ibuprofen 800 mg regularly and does not wish to continue havign to take this. She had her tubes tied in the past and feels like since that time her pain has progressively worsened. It feels like her insides are cramping twisting and she feels like it is something in her pelvis. While she felt like she was done with child-bearing, she isn't completely sure she would not want any children in the future I.e. having a tubal reversal, especially if the tubal reversal would help her pain. Her priority is the pain however and not child-bearing.   She has a history of BV in the past. She had a tubal and has had intermittent pain related to that since having the procedure. On her last exam with me, she did have levator spasm on exam.   Last GC/CT testing was 12/31/21 and was negative.   Her last [place] was 07/2020 and was normal.   She denies any abnormal vaginal discharge, bleeding.     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   Procedure: LAPAROSCOPIC SPLEEN SPARING AND DISTAL PANCRETECTOMY ;  Surgeon: Stark Klein, MD;  Location: [facility];  Service: General;  Laterality: N/A;   LAPAROSCOPIC TUBAL LIGATION Bilateral 07/08/2016   Procedure: LAPAROSCOPIC TUBAL LIGATION WITH FILSHIE CLIPS;  Surgeon: Osborne Oman, MD;  Location: [facility];  Service: Gynecology;  Laterality: Bilateral;   RHINOPLASTY     right foot  2016   cyst  removed   TONSILLECTOMY      The following portions of the patient's history were reviewed and updated as appropriate: allergies, current medications, past family history, past medical history, past social history, past surgical history and problem list.   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   Review of Systems:  Pertinent items noted in HPI and remainder of comprehensive ROS otherwise negative.  Physical Exam:  BP 122/76   Pulse 95   Ht '5\' 4"'$  (1.626 m)   Wt 169 lb (76.7 kg)   LMP 04/21/2022   BMI 29.01 kg/m  CONSTITUTIONAL: Well-developed, well-nourished female in no acute distress.  HEENT:  Normocephalic, atraumatic. External right and left ear normal. No scleral icterus.  NECK: Normal range of motion, supple, no masses noted on observation SKIN: No rash noted. Not diaphoretic. No erythema. No pallor. MUSCULOSKELETAL: Normal range of motion. No edema noted. NEUROLOGIC: Alert and oriented to person, place, and time. Normal muscle tone coordination. No cranial nerve deficit noted. PSYCHIATRIC: Normal mood and affect. Normal behavior. Normal judgment and thought content.   ABDOMEN: No masses noted. No other overt distention noted.    PELVIC: Normal appearing external genitalia; normal urethral meatus; normal appearing vaginal mucosa and  cervix. Bilateral levator tenderness and spasm, no obturator tenderness. Bladder nontender  No abnormal discharge noted.  Normal uterine size, no other palpable masses, Mild uterine tenderness and moderate adnexal tenderness.  Performed in the presence of a chaperone  Labs and Imaging No results found for this or any previous visit (from the past 168 hour(s)). No results found.  Assessment and Plan:   1. Pelvic pain - Discussed currently clear source of pain is pelvic floor dysfunction. I drew out an algorithm of a  plan based on her goals of care.  - We discussed checking a TVUS and doing PFPT. If the [place] is abnormal, then we could treat according to what might be the source of her pain. For instance, if she has a cyst like an endometrioma, then we would do surgery to remove that and that would explain her pain. We discussed that if her tubes show dilation, I would recommend referral to REI specialist to discuss removal vs reanastomosis.  - We discussed if the [place] is normal then the next step would be to see how PFPT is working. If her pain is improving then I would say surgery is not needed. If her [place] is normal but PFPT is not working, then I think surgery by a specialist is best so that she would have all options at the time of her surgery. For instance, they could remove endometriosis but also if no endometriosis then they could do either option for her tubes depending on her preference at that time I.e. remove them vs try reversal to see if that helps symptoms.  - After discussed treatment plan, she felt better and all questions answered. She was given the "algorithm" to go home with in the event she thought of more questions.  - We discussed giving PFPT about 6 weeks to work would be reasonable before deciding it isn't helping.  -     US PELVIC COMPLETE WITH TRANSVAGINAL; Future -     Ambulatory referral to Physical Therapy  Sign interpreter used throughout our appointment.   Routine preventative health maintenance measures emphasized. Please refer to After Visit Summary for other counseling recommendations.   Return if symptoms worsen or fail to improve.  Radene Gunning, MD, [place] [facility], [place]

## 2022-05-12 ENCOUNTER — Ambulatory Visit: Payer: Medicaid Other | Admitting: Obstetrics and Gynecology

## 2022-05-12 ENCOUNTER — Encounter: Payer: Self-pay | Admitting: Obstetrics and Gynecology

## 2022-05-12 VITALS — BP 122/76 | HR 95 | Ht 64.0 in | Wt 169.0 lb

## 2022-05-12 DIAGNOSIS — R102 Pelvic and perineal pain: Secondary | ICD-10-CM

## 2022-05-12 NOTE — Progress Notes (Signed)
Pelvic has progressively gotten worse since her tubal.  The pain is not associated with her cycles.  She has been to several DR's who just told her to take pain meds.

## 2022-05-13 ENCOUNTER — Ambulatory Visit: Payer: Medicaid Other

## 2022-05-13 ENCOUNTER — Ambulatory Visit (INDEPENDENT_AMBULATORY_CARE_PROVIDER_SITE_OTHER): Payer: Medicaid Other

## 2022-05-13 DIAGNOSIS — R102 Pelvic and perineal pain: Secondary | ICD-10-CM

## 2022-05-13 DIAGNOSIS — Z9851 Tubal ligation status: Secondary | ICD-10-CM | POA: Diagnosis not present

## 2022-05-13 DIAGNOSIS — D251 Intramural leiomyoma of uterus: Secondary | ICD-10-CM | POA: Diagnosis not present

## 2022-06-07 ENCOUNTER — Ambulatory Visit: Payer: Medicaid Other | Admitting: *Deleted

## 2022-06-07 DIAGNOSIS — Z23 Encounter for immunization: Secondary | ICD-10-CM | POA: Diagnosis not present

## 2022-06-07 NOTE — Progress Notes (Unsigned)
Pt here for Fluarix vaccine only.  Given in Rt deltoid IM

## 2022-06-15 ENCOUNTER — Encounter: Payer: Self-pay | Admitting: Family Medicine

## 2022-06-15 ENCOUNTER — Ambulatory Visit: Payer: Medicaid Other | Admitting: Family Medicine

## 2022-06-15 VITALS — BP 124/81 | HR 78 | Ht 64.0 in | Wt 173.1 lb

## 2022-06-15 DIAGNOSIS — Z23 Encounter for immunization: Secondary | ICD-10-CM | POA: Diagnosis not present

## 2022-06-15 DIAGNOSIS — Z0184 Encounter for antibody response examination: Secondary | ICD-10-CM

## 2022-06-15 NOTE — Addendum Note (Signed)
Addended by: Mertha Finders on: 06/15/2022 10:41 AM   Modules accepted: Orders

## 2022-06-15 NOTE — Progress Notes (Signed)
     Established patient visit   Patient: Diana Torres   DOB: [birth year]   32 y.o. Female  MRN: [id number] Visit Date: 06/15/2022  Today's healthcare provider: Owens Loffler, DO   Chief Complaint  Patient presents with   blood work   Immunizations    SUBJECTIVE    Chief Complaint  Patient presents with   blood work   Immunizations   HPI  Pt is sign language and an interpretor was used for communication. She presents for vaccinations and titers for a new job. She needs the MMR titer, Hep B titer, quant gold test and varicella vaccine per her work requirements  Review of Systems  Constitutional:  Negative for activity change, fatigue and fever.  Respiratory:  Negative for cough and shortness of breath.   Cardiovascular:  Negative for chest pain.  Gastrointestinal:  Negative for abdominal pain.  Genitourinary:  Negative for difficulty urinating.       No outpatient medications have been marked as taking for the 06/15/22 encounter (Office Visit) with Owens Loffler, DO.    OBJECTIVE    BP 124/81 (BP Location: Left Arm, Patient Position: Sitting, Cuff Size: Large)   Pulse 78   Ht 5\' 4"  (1.626 m)   Wt 173 lb 1.3 oz (78.5 kg)   SpO2 100%   BMI 29.71 kg/m   Physical Exam Vitals and nursing note reviewed.  Constitutional:      General: She is not in acute distress.    Appearance: Normal appearance.  HENT:     Head: Normocephalic and atraumatic.     Right Ear: External ear normal.     Left Ear: External ear normal.     Nose: Nose normal.  Eyes:     Conjunctiva/sclera: Conjunctivae normal.  Cardiovascular:     Rate and Rhythm: Normal rate and regular rhythm.  Pulmonary:     Effort: Pulmonary effort is normal.     Breath sounds: Normal breath sounds.  Neurological:     General: No focal deficit present.     Mental Status: She is alert and oriented to person, place, and time.  Psychiatric:        Mood and Affect: Mood normal.        Behavior: Behavior normal.         Thought Content: Thought content normal.        Judgment: Judgment normal.        ASSESSMENT & PLAN    Problem List Items Addressed This Visit       Other   Immunity to measles, mumps, and rubella determined by serologic test - Primary   Relevant Orders   Measles/Mumps/Rubella Immunity   Immunity status testing   Relevant Orders   HBsAb Quant HBIG Assessment   QuantiFERON-TB Gold Plus   Other Visit Diagnoses     Need for varicella vaccine       Relevant Orders   Varicella vaccine subcutaneous (Completed)            No orders of the defined types were placed in this encounter.   Orders Placed This Encounter  Procedures   Varicella vaccine subcutaneous   HBsAb Quant HBIG Assessment   QuantiFERON-TB Gold Plus   Measles/Mumps/Rubella Immunity     Owens Loffler, [place] at [facility] [address] [phone] (phone) [phone] (fax)  [place]

## 2022-06-16 LAB — HBSAB QUANT HBIG ASSESSMENT: HBsAb Quant HBIG Assessment: 5.2 m[IU]/mL

## 2022-06-22 LAB — QUANTIFERON-TB GOLD PLUS
QuantiFERON Mitogen Value: >10 IU/mL
QuantiFERON Nil Value: 0.01 IU/mL
QuantiFERON TB1 Ag Value: 0.03 IU/mL
QuantiFERON TB2 Ag Value: 0.04 IU/mL
QuantiFERON-TB Gold Plus: NEGATIVE

## 2022-06-22 LAB — MEASLES/MUMPS/RUBELLA IMMUNITY
MUMPS ABS, IGG: 98.4 AU/mL (ref >10.9)
RUBEOLA AB, IGG: 230 AU/mL (ref >16.4)
Rubella Antibodies, IGG: 2.54 index (ref >0.99)

## 2022-06-28 ENCOUNTER — Other Ambulatory Visit: Payer: Self-pay

## 2022-06-28 ENCOUNTER — Ambulatory Visit: Payer: Medicaid Other | Attending: Obstetrics and Gynecology

## 2022-06-28 ENCOUNTER — Encounter: Payer: Self-pay | Admitting: Physical Therapy

## 2022-06-28 DIAGNOSIS — R279 Unspecified lack of coordination: Secondary | ICD-10-CM | POA: Insufficient documentation

## 2022-06-28 DIAGNOSIS — R102 Pelvic and perineal pain: Secondary | ICD-10-CM | POA: Diagnosis not present

## 2022-06-28 DIAGNOSIS — M6281 Muscle weakness (generalized): Secondary | ICD-10-CM | POA: Insufficient documentation

## 2022-06-28 DIAGNOSIS — M62838 Other muscle spasm: Secondary | ICD-10-CM | POA: Insufficient documentation

## 2022-06-28 NOTE — Therapy (Signed)
OUTPATIENT PHYSICAL THERAPY FEMALE PELVIC EVALUATION   Patient Name: Diana Torres MRN: [id number] DOB:[birth year], 32 y.o., female Today's Date: 06/28/2022  END OF SESSION:  PT End of Session - 06/28/22 1029     Visit Number 1    Date for PT Re-Evaluation 12/13/22    Authorization Type Medicaid [employer]    PT Start Time [phone]    PT Stop Time 1030    PT Time Calculation (min) 35 min    Activity Tolerance Patient tolerated treatment well    Behavior During Therapy [facility] for tasks assessed/performed             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   with pregnancy   Mass    cyst/left upper abd quad   Miscarriage    pt states had difficulty with high BP prior to miscarriage and afterwards till infection was healed   PONV (postoperative nausea and vomiting)    UTI (lower urinary tract infection)    Past Surgical History:  Procedure Laterality Date   COCHLEAR IMPLANT     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   Procedure: LAPAROSCOPIC SPLEEN SPARING AND DISTAL PANCRETECTOMY ;  Surgeon: Stark Klein, MD;  Location: [facility];  Service: General;  Laterality: N/A;   LAPAROSCOPIC TUBAL LIGATION Bilateral 07/08/2016   Procedure: LAPAROSCOPIC TUBAL LIGATION WITH FILSHIE CLIPS;  Surgeon: Osborne Oman, MD;  Location: [facility];  Service: Gynecology;  Laterality: Bilateral;   RHINOPLASTY     right foot  2016   cyst removed   TONSILLECTOMY     Patient Active Problem List   Diagnosis Date Noted   Immunity to measles, mumps, and rubella determined by serologic test 06/15/2022   Immunity status testing 06/15/2022   Routine health maintenance 12/10/2021   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   Hearing impaired 08/14/2012    PCP: Owens Loffler, DO  REFERRING PROVIDER: Radene Gunning, MD  REFERRING DIAG: R10.2 (ICD-10-CM) - Pelvic pain  THERAPY DIAG:  Pelvic pain  Muscle weakness (generalized)  Other muscle  spasm  Unspecified lack of coordination  Rationale for Evaluation and Treatment: Rehabilitation  ONSET DATE: 3 years ago  SUBJECTIVE:                                                                                                                                                                                           06/28/22 SUBJECTIVE STATEMENT: Pt states that she has two children, then tubes tied in 2018, and then 1-2 years later she started having lower pelvic pain that feels sharp.  It would feel like period pain, but it was not her period. Worse when she was active. Sometimes it would get better with sitting/rest, but not always.  Fluid intake: Yes: drinks a lot of water    PAIN:  Are you having pain? Yes NPRS scale: 4-5/10 current, 8-9/10 Pain location:  lower abdominal pain  Pain type: sharp Pain description: constant   Aggravating factors: activity, everything Relieving factors: heat, lying down, medicine  PRECAUTIONS: None  WEIGHT BEARING RESTRICTIONS: No  FALLS:  Has patient fallen in last 6 months? 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  PLOF: Independent  PATIENT GOALS: decrease pain  PERTINENT HISTORY:  G2P2, laparoscopic splenectomy/distal pancreatectomy, laparoscopic tubal ligation  Sexual abuse: No  BOWEL MOVEMENT: Pain with bowel movement: No Type of bowel movement:Frequency 1-2x/day and Strain No Fully empty rectum: Yes: - Leakage: No Pads: No Fiber supplement: No  URINATION: Pain with urination: No Fully empty bladder: Yes: - Stream: Strong Urgency: No Frequency: WNL Leakage:  jumping just sometimes  Pads: No  INTERCOURSE: Pain with intercourse: During Penetration and when it's too hard Ability to have vaginal penetration:  Yes: - Climax: yes Marinoff Scale: 0/3  PREGNANCY: Vaginal deliveries 2 Tearing Yes: with first had to have  sutures C-section deliveries 0 Currently pregnant No  PROLAPSE: None   OBJECTIVE:  06/28/22:  PATIENT SURVEYS:   [id number]  COGNITION: Overall cognitive status: Within functional limits for tasks assessed     SENSATION: Light touch: Appears intact Proprioception: Appears intact  FUNCTIONAL TESTS:  Squat: Lt rotation, Rt weight shift  GAIT: Comments: WNL  POSTURE: rounded shoulders, decreased lumbar lordosis, decreased thoracic kyphosis, posterior pelvic tilt, and Rt thoracic curvature  LUMBARAROM/PROM:  A/PROM A/PROM  Eval (% available)  Flexion 50  Extension 50, same pain  Right lateral flexion WNL  Left lateral flexion WNL  Right rotation WNL  Left rotation WNL   (Blank rows = not tested)   LOWER EXTREMITY MMT:  MMT Right eval Left eval  Hip flexion 4 4  Hip extension 4 4  Hip abduction 4 4  Hip adduction 4 4  Hip internal rotation 4- 4-  Hip external rotation 4- 4-   PALPATION:   General   Tenderness throughout lower abdomen, scar tissue present in lower and upper abdomen                External Perineal Exam NA                             Internal Pelvic Floor NA  Patient confirms identification and approves PT to assess internal pelvic floor and treatment Yes - in future if needed  PELVIC MMT:   MMT eval  Vaginal   Internal Anal Sphincter   External Anal Sphincter   Puborectalis   Diastasis Recti 3 finger width seapration at umbilicus and above with distortion upon increased abdominal pressure  (Blank rows = not tested)        TONE: NA  PROLAPSE: NA  TODAY'S TREATMENT:  DATE:  06/28/22  EVAL  Exercises: Cat cow 10x Open books 10x Kneeling hip flexor stretch with overhead reach 60 sec Check all possible CPT codes: 97110- Therapeutic Exercise    Check all conditions that are expected to impact treatment:  None of these apply   If treatment provided at initial evaluation, no treatment charged due to lack of authorization.        PATIENT EDUCATION:  Education details: see above Person educated: Patient Education method: Explanation, Demonstration, Tactile cues, Verbal cues, and Handouts Education comprehension: verbalized understanding  HOME EXERCISE PROGRAM: [id number]  ASSESSMENT:  CLINICAL IMPRESSION: Patient is a 32 y.o. female who was seen today for physical therapy evaluation and treatment for chronic pelvic pain. Exam findings notable for abnormal posture, decreased lumbar flexion/extension with pain, hip weakness, core weakness, diastasis recti with 3 finger width separation and distortion,and abdominal scar tissue restriction. Signs and symptoms are most consistent with scar tissue restriction and weakness that are preventing normal pelvic support system and creating altered force mechanics. She tolerated initial treatment of anterior chain mobility exercises well with no increase in pain. She will benefit from skilled PT intervention in order to decrease pain, improve functional ability, and improve quality of life.   OBJECTIVE IMPAIRMENTS: decreased activity tolerance, decreased coordination, decreased endurance, decreased strength, increased fascial restrictions, increased muscle spasms, impaired tone, postural dysfunction, and pain.   ACTIVITY LIMITATIONS: lifting, bending, sitting, standing, squatting, continence, and locomotion level  PARTICIPATION LIMITATIONS: interpersonal relationship and community activity  PERSONAL FACTORS: 1 comorbidity: medical history  are also affecting patient's functional outcome.   REHAB POTENTIAL: Good  CLINICAL DECISION MAKING: Stable/uncomplicated  EVALUATION COMPLEXITY: Low   GOALS: Goals reviewed with patient? Yes  SHORT TERM GOALS: Target date: 07/02/22  Pt will be independent with HEP.   Baseline: Goal status: INITIAL  2.  Pt  will increase all impaired lumbar A/ROM by 25% without pain.  Baseline:  Goal status: INITIAL  3.  Pt will be able to correctly perform diaphragmatic breathing and appropriate pressure management in order to prevent worsening diastasis recti and decrease pain.   Baseline:  Goal status: INITIAL  4.  Pt will report pelvic pain no higher than 6/10 with any activity.  Baseline:  Goal status: INITIAL    LONG TERM GOALS: Target date: 12/13/22  Pt will be independent with advanced HEP.   Baseline:  Goal status: INITIAL  2.  Pt will be walk/stand for >30 minutes and lift >20lbs without increase in pelvic pain.  Baseline:  Goal status: INITIAL  3.  Pt will report no greater than 2/10 pelvic pain with any activity. Baseline:  Goal status: INITIAL  4.  Pt will demonstrate no abdominal distortion with exercise, lifting >20lbs, or increased abdominal pressure.  Baseline:  Goal status: INITIAL    PLAN:  PT FREQUENCY: 1-2x/week  PT DURATION: 6 months  PLANNED INTERVENTIONS: Therapeutic exercises, Therapeutic activity, Neuromuscular re-education, Balance training, Gait training, Patient/Family education, Self Care, Joint mobilization, Dry Needling, Biofeedback, and Manual therapy  PLAN FOR NEXT SESSION: Begin core training, manual techniques to loew abdomen, progress mobility activities.    Heather Roberts, PT, DPT03/25/2410:44 AM

## 2022-07-01 ENCOUNTER — Ambulatory Visit: Payer: Medicaid Other

## 2022-07-01 DIAGNOSIS — Z23 Encounter for immunization: Secondary | ICD-10-CM

## 2022-07-01 NOTE — Progress Notes (Signed)
Pt here for third Gardasil injection. Injection given and tolerated well.

## 2022-07-12 DIAGNOSIS — H9202 Otalgia, left ear: Secondary | ICD-10-CM | POA: Diagnosis not present

## 2022-07-22 DIAGNOSIS — J3489 Other specified disorders of nose and nasal sinuses: Secondary | ICD-10-CM | POA: Diagnosis not present

## 2022-08-10 ENCOUNTER — Ambulatory Visit: Payer: Medicaid Other

## 2022-08-12 ENCOUNTER — Ambulatory Visit: Payer: Medicaid Other | Attending: Obstetrics and Gynecology

## 2022-08-12 DIAGNOSIS — M6281 Muscle weakness (generalized): Secondary | ICD-10-CM | POA: Insufficient documentation

## 2022-08-12 DIAGNOSIS — M62838 Other muscle spasm: Secondary | ICD-10-CM | POA: Insufficient documentation

## 2022-08-12 DIAGNOSIS — R279 Unspecified lack of coordination: Secondary | ICD-10-CM | POA: Insufficient documentation

## 2022-08-12 DIAGNOSIS — R102 Pelvic and perineal pain: Secondary | ICD-10-CM | POA: Insufficient documentation

## 2022-08-17 ENCOUNTER — Ambulatory Visit: Payer: Medicaid Other

## 2022-08-24 ENCOUNTER — Ambulatory Visit: Payer: Medicaid Other

## 2022-08-31 ENCOUNTER — Ambulatory Visit: Payer: Medicaid Other

## 2022-08-31 DIAGNOSIS — M6281 Muscle weakness (generalized): Secondary | ICD-10-CM | POA: Diagnosis not present

## 2022-08-31 DIAGNOSIS — R279 Unspecified lack of coordination: Secondary | ICD-10-CM | POA: Diagnosis not present

## 2022-08-31 DIAGNOSIS — M62838 Other muscle spasm: Secondary | ICD-10-CM | POA: Diagnosis not present

## 2022-08-31 DIAGNOSIS — R102 Pelvic and perineal pain: Secondary | ICD-10-CM | POA: Diagnosis not present

## 2022-08-31 NOTE — Therapy (Signed)
OUTPATIENT PHYSICAL THERAPY TREATMENT NOTE   Patient Name: Diana Torres MRN: [id number] DOB:Sep 30, 1990, 32 y.o., female Today's Date: 08/31/2022  PCP: Charlton Amor, DO REFERRING PROVIDER: Milas Hock, MD  END OF SESSION:   PT End of Session - 08/31/22 1535     Visit Number 2    Date for PT Re-Evaluation 12/13/22    Authorization Type Medicaid [employer]    Authorization Time Period 08/31/2022 - 09/29/2022    Authorization - Visit Number 1    Authorization - Number of Visits 4    PT Start Time 1534    PT Stop Time 1612    PT Time Calculation (min) 38 min    Activity Tolerance Patient tolerated treatment well    Behavior During Therapy [facility] for tasks assessed/performed             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   with pregnancy   Mass    cyst/left upper abd quad   Miscarriage    pt states had difficulty with high BP prior to miscarriage and afterwards till infection was healed   PONV (postoperative nausea and vomiting)    UTI (lower urinary tract infection)    Past Surgical History:  Procedure Laterality Date   COCHLEAR IMPLANT     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   Procedure: LAPAROSCOPIC SPLEEN SPARING AND DISTAL PANCRETECTOMY ;  Surgeon: Almond Lint, MD;  Location: [facility];  Service: General;  Laterality: N/A;   LAPAROSCOPIC TUBAL LIGATION Bilateral 07/08/2016   Procedure: LAPAROSCOPIC TUBAL LIGATION WITH FILSHIE CLIPS;  Surgeon: Tereso Newcomer, MD;  Location: [facility];  Service: Gynecology;  Laterality: Bilateral;   RHINOPLASTY     right foot  2016   cyst removed   TONSILLECTOMY     Patient Active Problem List   Diagnosis Date Noted   Immunity to measles, mumps, and rubella determined by serologic test 06/15/2022   Immunity status testing 06/15/2022   Routine health maintenance 12/10/2021   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   Hearing impaired 08/14/2012    REFERRING DIAG: R10.2  (ICD-10-CM) - Pelvic pain  THERAPY DIAG:  Pelvic pain  Muscle weakness (generalized)  Other muscle spasm  Unspecified lack of coordination  Rationale for Evaluation and Treatment Rehabilitation  PERTINENT HISTORY: G2P2, laparoscopic splenectomy/distal pancreatectomy, laparoscopic tubal ligation   PRECAUTIONS: NA  SUBJECTIVE:                                                                                                                                                                                      SUBJECTIVE  STATEMENT:  Pt states that she is the same (2 months after initial visits). She states that she has spoken to mom and and apparently they have history of scar tissue being an issue in the family.    PAIN:  Are you having pain? Yes NPRS scale: 4-5/10 Pain location: lower abdominal pain  Pain type: sharp Pain description: constant   Aggravating factors: activity, everything Relieving factors: heat, lying down, medicine   06/28/22 SUBJECTIVE STATEMENT: Pt states that she has two children, then tubes tied in 2018, and then 1-2 years later she started having lower pelvic pain that feels sharp. It would feel like period pain, but it was not her period. Worse when she was active. Sometimes it would get better with sitting/rest, but not always.  Fluid intake: Yes: drinks a lot of water   PAIN:  Are you having pain? Yes NPRS scale: 4-5/10 current, 8-9/10 Pain location: lower abdominal pain  Pain type: sharp Pain description: constant   Aggravating factors: activity, everything Relieving factors: heat, lying down, medicine  PRECAUTIONS: None  WEIGHT BEARING RESTRICTIONS: No  FALLS:  Has patient fallen in last 6 months? 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  PLOF: Independent  PATIENT GOALS: decrease pain  PERTINENT HISTORY:  G2P2, laparoscopic  splenectomy/distal pancreatectomy, laparoscopic tubal ligation  Sexual abuse: No  BOWEL MOVEMENT: Pain with bowel movement: No Type of bowel movement:Frequency 1-2x/day and Strain No Fully empty rectum: Yes: - Leakage: No Pads: No Fiber supplement: No  URINATION: Pain with urination: No Fully empty bladder: Yes: - Stream: Strong Urgency: No Frequency: WNL Leakage: jumping just sometimes  Pads: No  INTERCOURSE: Pain with intercourse: During Penetration and when it's too hard Ability to have vaginal penetration:  Yes: - Climax: yes Marinoff Scale: 0/3  PREGNANCY: Vaginal deliveries 2 Tearing Yes: with first had to have sutures C-section deliveries 0 Currently pregnant No  PROLAPSE: None   OBJECTIVE:  06/28/22:  PATIENT SURVEYS:   PFIQ-7 67  COGNITION: Overall cognitive status: Within functional limits for tasks assessed     SENSATION: Light touch: Appears intact Proprioception: Appears intact  FUNCTIONAL TESTS:  Squat: Lt rotation, Rt weight shift  GAIT: Comments: WNL  POSTURE: rounded shoulders, decreased lumbar lordosis, decreased thoracic kyphosis, posterior pelvic tilt, and Rt thoracic curvature  LUMBARAROM/PROM:  A/PROM A/PROM  Eval (% available)  Flexion 50  Extension 50, same pain  Right lateral flexion WNL  Left lateral flexion WNL  Right rotation WNL  Left rotation WNL   (Blank rows = not tested)   LOWER EXTREMITY MMT:  MMT Right eval Left eval  Hip flexion 4 4  Hip extension 4 4  Hip abduction 4 4  Hip adduction 4 4  Hip internal rotation 4- 4-  Hip external rotation 4- 4-   PALPATION:   General   Tenderness throughout lower abdomen, scar tissue present in lower and upper abdomen                External Perineal Exam NA                             Internal Pelvic Floor NA  Patient confirms identification and approves PT to assess internal pelvic floor and treatment Yes - in future if needed  PELVIC MMT:   MMT eval   Vaginal   Internal Anal Sphincter  External Anal Sphincter   Puborectalis   Diastasis Recti 3 finger width seapration at umbilicus and above with distortion upon increased abdominal pressure  (Blank rows = not tested)        TONE: NA  PROLAPSE: NA  TODAY'S TREATMENT:                                                                                                                              DATE:  08/31/22 Manual: Soft tissue mobilization to lower abdomen Scar tissue mobilization to lower abdomen  Exercises: Modified thomas stretch 2 min bil Wide leg lower trunk rotation 2 x 10  06/28/22  EVAL  Exercises: Cat cow 10x Open books 10x Kneeling hip flexor stretch with overhead reach 60 sec Check all possible CPT codes: 16109- Therapeutic Exercise    Check all conditions that are expected to impact treatment: None of these apply   If treatment provided at initial evaluation, no treatment charged due to lack of authorization.        PATIENT EDUCATION:  Education details: see above Person educated: Patient Education method: Explanation, Demonstration, Tactile cues, Verbal cues, and Handouts Education comprehension: verbalized understanding  HOME EXERCISE PROGRAM: [id number]  ASSESSMENT:  CLINICAL IMPRESSION: Pt have not seen any change in condition since evaluation. Treatment focused on manual techniques to improve abdominal scar tissue and mobility. She tolerated this well, but did have soreness after treatment. She performed modified thomas stretch and wide foot lower trunk rotation to further address mobility through pelvis. We will plan to begin core training next session. She tolerated initial treatment of anterior chain mobility exercises well with no increase in pain. She will benefit from skilled PT intervention in order to decrease pain, improve functional ability, and improve quality of life.   OBJECTIVE IMPAIRMENTS: decreased activity tolerance, decreased  coordination, decreased endurance, decreased strength, increased fascial restrictions, increased muscle spasms, impaired tone, postural dysfunction, and pain.   ACTIVITY LIMITATIONS: lifting, bending, sitting, standing, squatting, continence, and locomotion level  PARTICIPATION LIMITATIONS: interpersonal relationship and community activity  PERSONAL FACTORS: 1 comorbidity: medical history are also affecting patient's functional outcome.   REHAB POTENTIAL: Good  CLINICAL DECISION MAKING: Stable/uncomplicated  EVALUATION COMPLEXITY: Low   GOALS: Goals reviewed with patient? Yes  SHORT TERM GOALS: Target date: 07/02/22 - updated 08/31/22  Pt will be independent with HEP.   Baseline: Goal status: IN PROGRESS  2.  Pt will increase all impaired lumbar A/ROM by 25% without pain.  Baseline:  Goal status: IN PROGRESS  3.  Pt will be able to correctly perform diaphragmatic breathing and appropriate pressure management in order to prevent worsening diastasis recti and decrease pain.   Baseline:  Goal status: IN PROGRESS  4.  Pt will report pelvic pain no higher than 6/10 with any activity.  Baseline:  Goal status: IN PROGRESS    LONG TERM GOALS: Target date: 12/13/22 - updated 08/31/22  Pt will be independent with advanced HEP.  Baseline:  Goal status: IN PROGRESS  2.  Pt will be walk/stand for >30 minutes and lift >20lbs without increase in pelvic pain.  Baseline:  Goal status: IN PROGRESS  3.  Pt will report no greater than 2/10 pelvic pain with any activity. Baseline:  Goal status: IN PROGRESS  4.  Pt will demonstrate no abdominal distortion with exercise, lifting >20lbs, or increased abdominal pressure.  Baseline:  Goal status: IN PROGRESS    PLAN:  PT FREQUENCY: 1-2x/week  PT DURATION: 6 months  PLANNED INTERVENTIONS: Therapeutic exercises, Therapeutic activity, Neuromuscular re-education, Balance training, Gait training, Patient/Family education, Self Care,  Joint mobilization, Dry Needling, Biofeedback, and Manual therapy  PLAN FOR NEXT SESSION: Begin core training, manual techniques to loew abdomen, progress mobility activities.   Julio Alm, PT, DPT05/28/244:15 PM

## 2022-09-07 ENCOUNTER — Ambulatory Visit: Payer: Medicaid Other | Attending: Obstetrics and Gynecology

## 2022-09-07 DIAGNOSIS — R279 Unspecified lack of coordination: Secondary | ICD-10-CM | POA: Insufficient documentation

## 2022-09-07 DIAGNOSIS — M6281 Muscle weakness (generalized): Secondary | ICD-10-CM | POA: Insufficient documentation

## 2022-09-07 DIAGNOSIS — M62838 Other muscle spasm: Secondary | ICD-10-CM | POA: Insufficient documentation

## 2022-09-07 DIAGNOSIS — R102 Pelvic and perineal pain: Secondary | ICD-10-CM | POA: Diagnosis not present

## 2022-09-07 NOTE — Therapy (Signed)
OUTPATIENT PHYSICAL THERAPY TREATMENT NOTE   Patient Name: Diana Torres MRN: [id number] DOB:[birth year], 32 y.o., female Today's Date: 09/07/2022  PCP: Charlton Amor, DO REFERRING PROVIDER: Milas Hock, MD  END OF SESSION:   PT End of Session - 09/07/22 0929     Visit Number 3    Date for PT Re-Evaluation 12/13/22    Authorization Type Medicaid [employer]    Authorization Time Period 08/31/2022 - 09/29/2022    Authorization - Visit Number 2    Authorization - Number of Visits 4    PT Start Time 0930    PT Stop Time 1010    PT Time Calculation (min) 40 min    Activity Tolerance Patient tolerated treatment well    Behavior During Therapy [facility] for tasks assessed/performed              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   with pregnancy   Mass    cyst/left upper abd quad   Miscarriage    pt states had difficulty with high BP prior to miscarriage and afterwards till infection was healed   PONV (postoperative nausea and vomiting)    UTI (lower urinary tract infection)    Past Surgical History:  Procedure Laterality Date   COCHLEAR IMPLANT     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   Procedure: LAPAROSCOPIC SPLEEN SPARING AND DISTAL PANCRETECTOMY ;  Surgeon: Almond Lint, MD;  Location: [facility];  Service: General;  Laterality: N/A;   LAPAROSCOPIC TUBAL LIGATION Bilateral 07/08/2016   Procedure: LAPAROSCOPIC TUBAL LIGATION WITH FILSHIE CLIPS;  Surgeon: Tereso Newcomer, MD;  Location: [facility];  Service: Gynecology;  Laterality: Bilateral;   RHINOPLASTY     right foot  2016   cyst removed   TONSILLECTOMY     Patient Active Problem List   Diagnosis Date Noted   Immunity to measles, mumps, and rubella determined by serologic test 06/15/2022   Immunity status testing 06/15/2022   Routine health maintenance 12/10/2021   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   Hearing impaired 08/14/2012    REFERRING DIAG:  R10.2 (ICD-10-CM) - Pelvic pain  THERAPY DIAG:  Pelvic pain  Muscle weakness (generalized)  Other muscle spasm  Unspecified lack of coordination  Rationale for Evaluation and Treatment Rehabilitation  PERTINENT HISTORY: G2P2, laparoscopic splenectomy/distal pancreatectomy, laparoscopic tubal ligation   PRECAUTIONS: NA  SUBJECTIVE:  SUBJECTIVE STATEMENT:  Pt states that she was about the same after last session. When she first wakes up the last several days, she has felt some sharp pain. She describes this almost like a period.    PAIN:  Are you having pain? Yes NPRS scale: 4-5/10 Pain location: lower abdominal pain  Pain type: sharp Pain description: constant   Aggravating factors: activity, everything Relieving factors: heat, lying down, medicine   06/28/22 SUBJECTIVE STATEMENT: Pt states that she has two children, then tubes tied in 2018, and then 1-2 years later she started having lower pelvic pain that feels sharp. It would feel like period pain, but it was not her period. Worse when she was active. Sometimes it would get better with sitting/rest, but not always.  Fluid intake: Yes: drinks a lot of water   PAIN:  Are you having pain? Yes NPRS scale: 4-5/10 current, 8-9/10 Pain location: lower abdominal pain  Pain type: sharp Pain description: constant   Aggravating factors: activity, everything Relieving factors: heat, lying down, medicine  PRECAUTIONS: None  WEIGHT BEARING RESTRICTIONS: No  FALLS:  Has patient fallen in last 6 months? 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  PLOF: Independent  PATIENT GOALS: decrease pain  PERTINENT HISTORY:  G2P2, laparoscopic  splenectomy/distal pancreatectomy, laparoscopic tubal ligation  Sexual abuse: No  BOWEL MOVEMENT: Pain with bowel movement: No Type of bowel movement:Frequency 1-2x/day and Strain No Fully empty rectum: Yes: - Leakage: No Pads: No Fiber supplement: No  URINATION: Pain with urination: No Fully empty bladder: Yes: - Stream: Strong Urgency: No Frequency: WNL Leakage: jumping just sometimes  Pads: No  INTERCOURSE: Pain with intercourse: During Penetration and when it's too hard Ability to have vaginal penetration:  Yes: - Climax: yes Marinoff Scale: 0/3  PREGNANCY: Vaginal deliveries 2 Tearing Yes: with first had to have sutures C-section deliveries 0 Currently pregnant No  PROLAPSE: None   OBJECTIVE:  06/28/22:  PATIENT SURVEYS:   PFIQ-7 67  COGNITION: Overall cognitive status: Within functional limits for tasks assessed     SENSATION: Light touch: Appears intact Proprioception: Appears intact  FUNCTIONAL TESTS:  Squat: Lt rotation, Rt weight shift  GAIT: Comments: WNL  POSTURE: rounded shoulders, decreased lumbar lordosis, decreased thoracic kyphosis, posterior pelvic tilt, and Rt thoracic curvature  LUMBARAROM/PROM:  A/PROM A/PROM  Eval (% available)  Flexion 50  Extension 50, same pain  Right lateral flexion WNL  Left lateral flexion WNL  Right rotation WNL  Left rotation WNL   (Blank rows = not tested)   LOWER EXTREMITY MMT:  MMT Right eval Left eval  Hip flexion 4 4  Hip extension 4 4  Hip abduction 4 4  Hip adduction 4 4  Hip internal rotation 4- 4-  Hip external rotation 4- 4-   PALPATION:   General   Tenderness throughout lower abdomen, scar tissue present in lower and upper abdomen                External Perineal Exam NA                             Internal Pelvic Floor NA  Patient confirms identification and approves PT to assess internal pelvic floor and treatment Yes - in future if needed  PELVIC MMT:   MMT eval   Vaginal   Internal Anal Sphincter  External Anal Sphincter   Puborectalis   Diastasis Recti 3 finger width seapration at umbilicus and above with distortion upon increased abdominal pressure  (Blank rows = not tested)        TONE: NA  PROLAPSE: NA  TODAY'S TREATMENT:                                                                                                                              DATE:  09/07/22 Manual: Soft tissue mobilization to lower abdomen Scar tissue mobilization to lower abdomen  Manual mobilization with movement (bent knee fall out) Neuromuscular re-education: Transversus abdominus training with multimodal cues for improved motor control and breath coordination Bil UE ball press    08/31/22 Manual: Soft tissue mobilization to lower abdomen Scar tissue mobilization to lower abdomen  Exercises: Modified thomas stretch 2 min bil Wide leg lower trunk rotation 2 x 10  06/28/22  EVAL  Exercises: Cat cow 10x Open books 10x Kneeling hip flexor stretch with overhead reach 60 sec Check all possible CPT codes: 40981- Therapeutic Exercise    Check all conditions that are expected to impact treatment: None of these apply   If treatment provided at initial evaluation, no treatment charged due to lack of authorization.        PATIENT EDUCATION:  Education details: see above Person educated: Patient Education method: Explanation, Demonstration, Tactile cues, Verbal cues, and Handouts Education comprehension: verbalized understanding  HOME EXERCISE PROGRAM: [id number]  ASSESSMENT:  CLINICAL IMPRESSION: PT having several days of sharp cramping in lower abdomen. Further manual techniques to address restriction in lower abdomen performed today with addition of bent knee fall outs to help increase mobility. She was able to start core training with extreme diffuclty and tendency to breath hold/increase abdominal pressure in order to achieve abdominal activation.  We were able to work through this for better activation strategies, but patient did have some increase in abdominal cramping. Believe it was a combination of all treatment that increased discomfort; we discussed that this means we are accessing the correct area as long as we keep pain levels management. She was encouraged to perform stretches to help reduce discomfort. She will benefit from skilled PT intervention in order to decrease pain, improve functional ability, and improve quality of life.   OBJECTIVE IMPAIRMENTS: decreased activity tolerance, decreased coordination, decreased endurance, decreased strength, increased fascial restrictions, increased muscle spasms, impaired tone, postural dysfunction, and pain.   ACTIVITY LIMITATIONS: lifting, bending, sitting, standing, squatting, continence, and locomotion level  PARTICIPATION LIMITATIONS: interpersonal relationship and community activity  PERSONAL FACTORS: 1 comorbidity: medical history are also affecting patient's functional outcome.   REHAB POTENTIAL: Good  CLINICAL DECISION MAKING: Stable/uncomplicated  EVALUATION COMPLEXITY: Low   GOALS: Goals reviewed with patient? Yes  SHORT TERM GOALS: Target date: 07/02/22 - updated 08/31/22  Pt will be independent with HEP.   Baseline: Goal status: IN PROGRESS  2.  Pt will increase all impaired lumbar A/ROM  by 25% without pain.  Baseline:  Goal status: IN PROGRESS  3.  Pt will be able to correctly perform diaphragmatic breathing and appropriate pressure management in order to prevent worsening diastasis recti and decrease pain.   Baseline:  Goal status: IN PROGRESS  4.  Pt will report pelvic pain no higher than 6/10 with any activity.  Baseline:  Goal status: IN PROGRESS    LONG TERM GOALS: Target date: 12/13/22 - updated 08/31/22  Pt will be independent with advanced HEP.   Baseline:  Goal status: IN PROGRESS  2.  Pt will be walk/stand for >30 minutes and lift >20lbs  without increase in pelvic pain.  Baseline:  Goal status: IN PROGRESS  3.  Pt will report no greater than 2/10 pelvic pain with any activity. Baseline:  Goal status: IN PROGRESS  4.  Pt will demonstrate no abdominal distortion with exercise, lifting >20lbs, or increased abdominal pressure.  Baseline:  Goal status: IN PROGRESS    PLAN:  PT FREQUENCY: 1-2x/week  PT DURATION: 6 months  PLANNED INTERVENTIONS: Therapeutic exercises, Therapeutic activity, Neuromuscular re-education, Balance training, Gait training, Patient/Family education, Self Care, Joint mobilization, Dry Needling, Biofeedback, and Manual therapy  PLAN FOR NEXT SESSION: Continue core training and manual techniques to abdomen.   Julio Alm, PT, DPT06/07/2408:31 AM

## 2022-09-14 ENCOUNTER — Ambulatory Visit: Payer: Medicaid Other

## 2022-09-14 DIAGNOSIS — M62838 Other muscle spasm: Secondary | ICD-10-CM

## 2022-09-14 DIAGNOSIS — R102 Pelvic and perineal pain: Secondary | ICD-10-CM

## 2022-09-14 DIAGNOSIS — M6281 Muscle weakness (generalized): Secondary | ICD-10-CM

## 2022-09-14 DIAGNOSIS — R279 Unspecified lack of coordination: Secondary | ICD-10-CM | POA: Diagnosis not present

## 2022-09-14 NOTE — Therapy (Addendum)
OUTPATIENT PHYSICAL THERAPY TREATMENT NOTE   Patient Name: Diana Torres MRN: [id number] DOB:Aug 15, 1990, 32 y.o., female Today's Date: 09/14/2022  PCP: Charlton Amor, DO REFERRING PROVIDER: Milas Hock, MD  END OF SESSION:   PT End of Session - 09/14/22 1444     Visit Number 4    Date for PT Re-Evaluation 12/13/22    Authorization Type Medicaid [employer]    Authorization Time Period 08/31/2022 - 09/29/2022    Authorization - Visit Number 3    Authorization - Number of Visits 4    PT Start Time 1445    PT Stop Time 1525    PT Time Calculation (min) 40 min    Activity Tolerance Patient tolerated treatment well    Behavior During Therapy [facility] for tasks assessed/performed              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   with pregnancy   Mass    cyst/left upper abd quad   Miscarriage    pt states had difficulty with high BP prior to miscarriage and afterwards till infection was healed   PONV (postoperative nausea and vomiting)    UTI (lower urinary tract infection)    Past Surgical History:  Procedure Laterality Date   COCHLEAR IMPLANT     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   Procedure: LAPAROSCOPIC SPLEEN SPARING AND DISTAL PANCRETECTOMY ;  Surgeon: Almond Lint, MD;  Location: [facility];  Service: General;  Laterality: N/A;   LAPAROSCOPIC TUBAL LIGATION Bilateral 07/08/2016   Procedure: LAPAROSCOPIC TUBAL LIGATION WITH FILSHIE CLIPS;  Surgeon: Tereso Newcomer, MD;  Location: [facility];  Service: Gynecology;  Laterality: Bilateral;   RHINOPLASTY     right foot  2016   cyst removed   TONSILLECTOMY     Patient Active Problem List   Diagnosis Date Noted   Immunity to measles, mumps, and rubella determined by serologic test 06/15/2022   Immunity status testing 06/15/2022   Routine health maintenance 12/10/2021   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   Hearing impaired 08/14/2012    REFERRING DIAG:  R10.2 (ICD-10-CM) - Pelvic pain  THERAPY DIAG:  Pelvic pain  Muscle weakness (generalized)  Other muscle spasm  Unspecified lack of coordination  Rationale for Evaluation and Treatment Rehabilitation  PERTINENT HISTORY: G2P2, laparoscopic splenectomy/distal pancreatectomy, laparoscopic tubal ligation   PRECAUTIONS: NA  SUBJECTIVE:  SUBJECTIVE STATEMENT:  Pt states that she had severe episode of pain Friday night, greate ron Rt side. Lifting makes the pain worse and moving quickly. Very sharp pain. Pain eased off with massage, heat, and tylenol/ibuprofen. She has not had another severe episode of 10/10.    PAIN:  Are you having pain? Yes NPRS scale: 4/10 Pain location: lower abdominal pain  Pain type: sharp Pain description: constant   Aggravating factors: activity, everything Relieving factors: heat, lying down, medicine   06/28/22 SUBJECTIVE STATEMENT: Pt states that she has two children, then tubes tied in 2018, and then 1-2 years later she started having lower pelvic pain that feels sharp. It would feel like period pain, but it was not her period. Worse when she was active. Sometimes it would get better with sitting/rest, but not always.  Fluid intake: Yes: drinks a lot of water   PAIN:  Are you having pain? Yes NPRS scale: 4-5/10 current, 8-9/10 Pain location: lower abdominal pain  Pain type: sharp Pain description: constant   Aggravating factors: activity, everything Relieving factors: heat, lying down, medicine  PRECAUTIONS: None  WEIGHT BEARING RESTRICTIONS: No  FALLS:  Has patient fallen in last 6 months? 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  PLOF: Independent  PATIENT  GOALS: decrease pain  PERTINENT HISTORY:  G2P2, laparoscopic splenectomy/distal pancreatectomy, laparoscopic tubal ligation  Sexual abuse: No  BOWEL MOVEMENT: Pain with bowel movement: No Type of bowel movement:Frequency 1-2x/day and Strain No Fully empty rectum: Yes: - Leakage: No Pads: No Fiber supplement: No  URINATION: Pain with urination: No Fully empty bladder: Yes: - Stream: Strong Urgency: No Frequency: WNL Leakage: jumping just sometimes  Pads: No  INTERCOURSE: Pain with intercourse: During Penetration and when it's too hard Ability to have vaginal penetration:  Yes: - Climax: yes Marinoff Scale: 0/3  PREGNANCY: Vaginal deliveries 2 Tearing Yes: with first had to have sutures C-section deliveries 0 Currently pregnant No  PROLAPSE: None   OBJECTIVE:  09/14/22:                External Perineal Exam WNL                             Internal Pelvic Floor Significant pain with palpation of Rt deep pelvic floor  Patient confirms identification and approves PT to assess internal pelvic floor and treatment Yes  PELVIC MMT:   MMT eval  Vaginal 2/5  Internal Anal Sphincter   External Anal Sphincter   Puborectalis   Diastasis Recti 3 finger width seapration at umbilicus and above with distortion upon increased abdominal pressure  (Blank rows = not tested)        TONE: High, Rt>Lt  PROLAPSE: Gr 1 anterior vaginal wall laxity  06/28/22: PATIENT SURVEYS:   PFIQ-7 67  COGNITION: Overall cognitive status: Within functional limits for tasks assessed     SENSATION: Light touch: Appears intact Proprioception: Appears intact  FUNCTIONAL TESTS:  Squat: Lt rotation, Rt weight shift  GAIT: Comments: WNL  POSTURE: rounded shoulders, decreased lumbar lordosis, decreased thoracic kyphosis, posterior pelvic tilt, and Rt thoracic curvature  LUMBARAROM/PROM:  A/PROM A/PROM  Eval (% available)  Flexion 50  Extension 50, same pain  Right lateral  flexion WNL  Left lateral flexion WNL  Right rotation WNL  Left rotation WNL   (Blank rows = not tested)  LOWER EXTREMITY MMT:  MMT Right eval Left eval  Hip flexion 4 4  Hip extension 4 4  Hip abduction 4 4  Hip adduction 4 4  Hip internal rotation 4- 4-  Hip external rotation 4- 4-   PALPATION:   General   Tenderness throughout lower abdomen, scar tissue present in lower and upper abdomen   TODAY'S TREATMENT:                                                                                                                              DATE:  09/14/22 Manual: Pt provides verbal consent for internal vaginal/rectal pelvic floor exam. Rt sided deep pelvic floor muscle release Neuromuscular re-education: Diaphragmatic breathing Child's pose 10 breaths Happy baby 10 breaths   09/07/22 Manual: Soft tissue mobilization to lower abdomen Scar tissue mobilization to lower abdomen  Manual mobilization with movement (bent knee fall out) Neuromuscular re-education: Transversus abdominus training with multimodal cues for improved motor control and breath coordination Bil UE ball press    08/31/22 Manual: Soft tissue mobilization to lower abdomen Scar tissue mobilization to lower abdomen  Exercises: Modified thomas stretch 2 min bil Wide leg lower trunk rotation 2 x 10  PATIENT EDUCATION:  Education details: see above Person educated: Patient Education method: Explanation, Demonstration, Tactile cues, Verbal cues, and Handouts Education comprehension: verbalized understanding  HOME EXERCISE PROGRAM: [id number]  ASSESSMENT:  CLINICAL IMPRESSION: Pt had severe flare up of pain at the end of last week. She was encouraged to call MD if this happens again. Due to persistent pain without change working externally in abdomen, we performed internal vaginal pelvic floor muscle assessment with notable findings of pain, increased tone, and reproduction of familiar symptoms. She  tolerated deep pelvic floor muscle release with diaphragmatic breathing very well, although she did have pain. She was able to perform down training activities afterwards with decrease in discomfort. HEP updated with these activities. Believe that addressing pelvic floor muscle tension and accessing any abdominal scar tissue internally will likely help pt see improvement in condition. She was encouraged to perform stretches to help reduce discomfort. She will benefit from skilled PT intervention in order to decrease pain, improve functional ability, and improve quality of life.   OBJECTIVE IMPAIRMENTS: decreased activity tolerance, decreased coordination, decreased endurance, decreased strength, increased fascial restrictions, increased muscle spasms, impaired tone, postural dysfunction, and pain.   ACTIVITY LIMITATIONS: lifting, bending, sitting, standing, squatting, continence, and locomotion level  PARTICIPATION LIMITATIONS: interpersonal relationship and community activity  PERSONAL FACTORS: 1 comorbidity: medical history are also affecting patient's functional outcome.   REHAB POTENTIAL: Good  CLINICAL DECISION MAKING: Stable/uncomplicated  EVALUATION COMPLEXITY: Low   GOALS: Goals reviewed with patient? Yes  SHORT TERM GOALS: Target date: 07/02/22 - updated 08/31/22  Pt will be independent with HEP.   Baseline: Goal status: IN PROGRESS  2.  Pt will increase all impaired lumbar A/ROM by 25% without pain.  Baseline:  Goal status:  IN PROGRESS  3.  Pt will be able to correctly perform diaphragmatic breathing and appropriate pressure management in order to prevent worsening diastasis recti and decrease pain.   Baseline:  Goal status: IN PROGRESS  4.  Pt will report pelvic pain no higher than 6/10 with any activity.  Baseline:  Goal status: IN PROGRESS    LONG TERM GOALS: Target date: 12/13/22 - updated 08/31/22  Pt will be independent with advanced HEP.   Baseline:  Goal  status: IN PROGRESS  2.  Pt will be walk/stand for >30 minutes and lift >20lbs without increase in pelvic pain.  Baseline:  Goal status: IN PROGRESS  3.  Pt will report no greater than 2/10 pelvic pain with any activity. Baseline:  Goal status: IN PROGRESS  4.  Pt will demonstrate no abdominal distortion with exercise, lifting >20lbs, or increased abdominal pressure.  Baseline:  Goal status: IN PROGRESS    PLAN:  PT FREQUENCY: 1-2x/week  PT DURATION: 6 months  PLANNED INTERVENTIONS: Therapeutic exercises, Therapeutic activity, Neuromuscular re-education, Balance training, Gait training, Patient/Family education, Self Care, Joint mobilization, Dry Needling, Biofeedback, and Manual therapy  PLAN FOR NEXT SESSION: Core training, mobility exercises; internal pelvic floor muscle release.   Julio Alm, PT, DPT06/11/243:29 PM  PHYSICAL THERAPY DISCHARGE SUMMARY  Visits from Start of Care: 4  Current functional level related to goals / functional outcomes: Unknown   Remaining deficits: See above   Education / Equipment: HEP   Patient agrees to discharge. Patient goals were partially met. Patient is being discharged due to not returning since the last visit.  Julio Alm, PT, DPT02/04/259:26 AM

## 2022-09-21 ENCOUNTER — Ambulatory Visit: Payer: Medicaid Other

## 2022-09-23 DIAGNOSIS — J029 Acute pharyngitis, unspecified: Secondary | ICD-10-CM | POA: Diagnosis not present

## 2022-09-23 DIAGNOSIS — U071 COVID-19: Secondary | ICD-10-CM | POA: Diagnosis not present

## 2022-09-23 DIAGNOSIS — R509 Fever, unspecified: Secondary | ICD-10-CM | POA: Diagnosis not present

## 2022-09-30 ENCOUNTER — Ambulatory Visit (INDEPENDENT_AMBULATORY_CARE_PROVIDER_SITE_OTHER): Payer: Medicaid Other | Admitting: Obstetrics and Gynecology

## 2022-09-30 ENCOUNTER — Encounter: Payer: Self-pay | Admitting: Obstetrics and Gynecology

## 2022-09-30 VITALS — BP 123/86 | HR 68 | Ht 64.0 in | Wt 166.0 lb

## 2022-09-30 DIAGNOSIS — R102 Pelvic and perineal pain: Secondary | ICD-10-CM

## 2022-09-30 MED ORDER — METHOCARBAMOL 750 MG PO TABS: 750.0000 mg | ORAL_TABLET | Freq: Four times a day (QID) | ORAL | 3 refills | Status: DC | PRN

## 2022-09-30 NOTE — Progress Notes (Signed)
GYNECOLOGY OFFICE VISIT NOTE  History:   Diana Torres is a 32 y.o. [id number] here today for ongoing pelvic pain. She has been doing PT and has seen only slight benefit.   Her pelvic pain feels inside her abdomen and is closer to her scar line/upper pelvis than down lower. Pain is sharp and stabbing. Motrin provides some relief but not enough such that at one point the pain became severe enough she went to the emergency room.    She had an [place] in February for her pain which was overall negative besides a 1 cm fibroid. We reviewed this [place].   She is still concerned the tubal is the source of her pain and would like to have it reversed. We discussed it is possible but insurance does not cover tubal reversals. She would stil like to look into it. She had also talked to her mom about hysterectomy.     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   Procedure: LAPAROSCOPIC SPLEEN SPARING AND DISTAL PANCRETECTOMY ;  Surgeon: Almond Lint, MD;  Location: [facility];  Service: General;  Laterality: N/A;   LAPAROSCOPIC TUBAL LIGATION Bilateral 07/08/2016   Procedure: LAPAROSCOPIC TUBAL LIGATION WITH FILSHIE CLIPS;  Surgeon: Tereso Newcomer, MD;  Location: [facility];  Service: Gynecology;  Laterality: Bilateral;   RHINOPLASTY     right foot  2016   cyst removed   TONSILLECTOMY      The following portions of the patient's history were reviewed and updated as appropriate: allergies, current medications, past family history, past medical history, past social history, past surgical  history and problem list.   Health Maintenance:   Diagnosis  Date Value Ref Range Status  12/10/2021 - Low grade squamous intraepithelial lesion (LSIL) (A)  Final    Review of Systems:  Pertinent items noted in HPI and remainder of comprehensive ROS otherwise negative.  Physical Exam:  BP 123/86   Pulse 68   Ht 5\' 4"  (1.626 m)   Wt 166 lb (75.3 kg)   LMP 09/28/2022   BMI 28.49 kg/m  CONSTITUTIONAL: Well-developed, well-nourished female in no acute distress.  HEENT:  Normocephalic, atraumatic. External right and left ear normal. No scleral icterus.  NECK: Normal range of motion, supple, no masses noted on observation SKIN: No rash noted. Not diaphoretic. No erythema. No pallor. MUSCULOSKELETAL: Normal range of motion. No edema noted. NEUROLOGIC: Alert and oriented to person, place, and time. Normal muscle tone coordination. No cranial nerve deficit noted. PSYCHIATRIC: Normal mood and affect. Normal behavior. Normal judgment and thought content.  ABDOMEN: No masses noted. No other overt distention noted.  Her abdomen is nontender on exam. No abdominal wall defects. I could not reproduce the pain  PELVIC: Normal appearing external genitalia; normal urethral meatus; Cervix was normal consistent on exam. Normal uterine size, no other palpable masses, no uterine or adnexal tenderness. Bladder is non tender. She has bilateral mild levator tenderness. No obturator m. Tenderness. Performed in the presence  of a chaperone  Labs and Imaging No results found for this or any previous visit (from the past 168 hour(s)). No results found.  Assessment and Plan:   1. Pelvic pain I think her pain may be due to myofascial pain. We have discussed her pain may not be related to her tubal and that reversing it may not improve her pain. We also discussed based on the lack of uterine tenderness, I don't think a hysterectomy would help and I would exhaust all other options. I think it would be helpful for  her to see Dr. Briscoe Deutscher for possible injections. I also will start her on a muscle relaxant (Robaxin) to see if that helps as well. She would like to hold on PT based on their most recent interaction. Information given for Dr. Jamse Arn as well.  -     methocarbamol (ROBAXIN-750) 750 MG tablet; Take 1 tablet (750 mg total) by mouth every 6 (six) hours as needed for muscle spasms.    Routine preventative health maintenance measures emphasized. Please refer to After Visit Summary for other counseling recommendations.   No follow-ups on file.  Milas Hock, MD, FACOG Obstetrician & Gynecologist, [facility] for [facility], [facility]

## 2022-10-11 DIAGNOSIS — R030 Elevated blood-pressure reading, without diagnosis of hypertension: Secondary | ICD-10-CM | POA: Diagnosis not present

## 2022-10-11 DIAGNOSIS — Z6829 Body mass index (BMI) 29.0-29.9, adult: Secondary | ICD-10-CM | POA: Diagnosis not present

## 2022-10-11 DIAGNOSIS — H9202 Otalgia, left ear: Secondary | ICD-10-CM | POA: Diagnosis not present

## 2022-10-26 ENCOUNTER — Ambulatory Visit: Payer: Medicaid Other

## 2022-11-10 ENCOUNTER — Ambulatory Visit: Payer: Medicaid Other | Admitting: Obstetrics and Gynecology

## 2022-11-12 ENCOUNTER — Ambulatory Visit: Payer: Medicaid Other | Admitting: Obstetrics and Gynecology

## 2022-12-16 ENCOUNTER — Encounter: Payer: Medicaid Other | Admitting: Family Medicine

## 2023-01-05 ENCOUNTER — Ambulatory Visit: Payer: Medicaid Other

## 2023-01-05 ENCOUNTER — Ambulatory Visit: Payer: Medicaid Other | Admitting: Family Medicine

## 2023-01-05 VITALS — BP 128/92 | HR 76 | Ht 64.0 in | Wt 164.2 lb

## 2023-01-05 DIAGNOSIS — R1013 Epigastric pain: Secondary | ICD-10-CM | POA: Diagnosis not present

## 2023-01-05 MED ORDER — OMEPRAZOLE 20 MG PO CPDR
20.0000 mg | DELAYED_RELEASE_CAPSULE | Freq: Every day | ORAL | 2 refills | Status: DC
Start: 2023-01-05 — End: 2023-03-22

## 2023-01-05 MED ORDER — DICYCLOMINE HCL 10 MG PO CAPS: 10.0000 mg | ORAL_CAPSULE | Freq: Three times a day (TID) | ORAL | 0 refills | Status: DC

## 2023-01-05 NOTE — Progress Notes (Signed)
Acute Office Visit  Subjective:     Patient ID: Diana Torres, female    DOB: Aug 04, 1990, 32 y.o.   MRN: [id number]  Chief Complaint  Patient presents with   Back Pain   Abdominal Pain    Pt states this has been going on 1wk and that she has Excedrin, ibuprofen, and tylenol nothing eases the pain    HPI Patient is in today for abdominal pain for one week. Sign language interpretor present to aid in communication. Localizes pain to epigastric region. Admits to some nausea with eating and an increase in fatigue. Says she feels like something gets stuck when taking her medicine. Also notes radiation to back. Denies fevers.   Review of Systems  Constitutional:  Negative for chills and fever.  Respiratory:  Negative for cough and shortness of breath.   Cardiovascular:  Negative for chest pain.  Gastrointestinal:  Positive for abdominal pain.  Neurological:  Negative for headaches.        Objective:    BP (!) 128/92 (BP Location: Left Arm, Patient Position: Sitting, Cuff Size: Normal)   Pulse 76   Ht 5\' 4"  (1.626 m)   Wt 164 lb 4 oz (74.5 kg)   SpO2 100%   BMI 28.19 kg/m    Physical Exam Vitals and nursing note reviewed.  Constitutional:      General: She is not in acute distress.    Appearance: Normal appearance.  HENT:     Head: Normocephalic and atraumatic.     Right Ear: External ear normal.     Left Ear: External ear normal.     Nose: Nose normal.  Eyes:     Conjunctiva/sclera: Conjunctivae normal.  Cardiovascular:     Rate and Rhythm: Normal rate and regular rhythm.  Pulmonary:     Effort: Pulmonary effort is normal.     Breath sounds: Normal breath sounds.  Abdominal:     Tenderness: There is abdominal tenderness in the epigastric area.  Neurological:     General: No focal deficit present.     Mental Status: She is alert and oriented to person, place, and time.  Psychiatric:        Mood and Affect: Mood normal.        Behavior: Behavior normal.         Thought Content: Thought content normal.        Judgment: Judgment normal.     No results found for any visits on 01/05/23.      Assessment & Plan:   Problem List Items Addressed This Visit       Other   Epigastric abdominal pain - Primary    Pt notes significant epigastric pain. 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      Relevant Medications   omeprazole (PRILOSEC) 20 MG capsule   Other Relevant Orders   CBC with Differential   CMP14+EGFR   Lipase   DG Abd 1 View    Meds ordered this encounter  Medications   omeprazole (PRILOSEC) 20 MG capsule    Sig: Take  1 capsule (20 mg total) by mouth daily.    Dispense:  30 capsule    Refill:  2   dicyclomine (BENTYL) 10 MG capsule    Sig: Take 1 capsule (10 mg total) by mouth 4 (four) times daily -  before meals and at bedtime.    Dispense:  30 capsule    Refill:  0    No follow-ups on file.  Charlton Amor, DO

## 2023-01-05 NOTE — Assessment & Plan Note (Signed)
Pt notes significant epigastric pain. 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

## 2023-01-05 NOTE — Patient Instructions (Signed)
Take omperazole daily to see if this helps with pain (this will help if what you have is a stomach ulcer)  Take the dicyclomine as needed to see if this is a bad stomach spasm.

## 2023-01-06 ENCOUNTER — Other Ambulatory Visit: Payer: Self-pay | Admitting: Family Medicine

## 2023-01-06 DIAGNOSIS — R1013 Epigastric pain: Secondary | ICD-10-CM

## 2023-01-06 LAB — CMP14+EGFR
ALT: 9 [IU]/L (ref 0–32)
AST: 14 [IU]/L (ref 0–40)
Albumin: 4.6 g/dL (ref 3.9–4.9)
Alkaline Phosphatase: 75 [IU]/L (ref 44–121)
BUN/Creatinine Ratio: 9 (ref 9–23)
BUN: 7 mg/dL (ref 6–20)
Bilirubin Total: 0.4 mg/dL (ref 0.0–1.2)
CO2: 23 mmol/L (ref 20–29)
Calcium: 9.8 mg/dL (ref 8.7–10.2)
Chloride: 103 mmol/L (ref 96–106)
Creatinine, Ser: 0.78 mg/dL (ref 0.57–1.00)
Globulin, Total: 2.8 g/dL (ref 1.5–4.5)
Glucose: 82 mg/dL (ref 70–99)
Potassium: 4.5 mmol/L (ref 3.5–5.2)
Sodium: 140 mmol/L (ref 134–144)
Total Protein: 7.4 g/dL (ref 6.0–8.5)
eGFR: 104 mL/min/{1.73_m2} (ref >59)

## 2023-01-06 LAB — CBC WITH DIFFERENTIAL/PLATELET
Basophils Absolute: 0 10*3/uL (ref 0.0–0.2)
Basos: 1 %
EOS (ABSOLUTE): 0.2 10*3/uL (ref 0.0–0.4)
Eos: 3 %
Hematocrit: 39.3 % (ref 34.0–46.6)
Hemoglobin: 12.8 g/dL (ref 11.1–15.9)
Immature Grans (Abs): 0 10*3/uL (ref 0.0–0.1)
Immature Granulocytes: 0 %
Lymphocytes Absolute: 1.8 10*3/uL (ref 0.7–3.1)
Lymphs: 34 %
MCH: 28.9 pg (ref 26.6–33.0)
MCHC: 32.6 g/dL (ref 31.5–35.7)
MCV: 89 fL (ref 79–97)
Monocytes Absolute: 0.4 10*3/uL (ref 0.1–0.9)
Monocytes: 7 %
Neutrophils Absolute: 2.9 10*3/uL (ref 1.4–7.0)
Neutrophils: 55 %
Platelets: 291 10*3/uL (ref 150–450)
RBC: 4.43 x10E6/uL (ref 3.77–5.28)
RDW: 12.4 % (ref 11.7–15.4)
WBC: 5.3 10*3/uL (ref 3.4–10.8)

## 2023-01-06 LAB — LIPASE: Lipase: 29 U/L (ref 14–72)

## 2023-01-10 ENCOUNTER — Encounter: Payer: Medicaid Other | Admitting: Family Medicine

## 2023-01-10 NOTE — Progress Notes (Deleted)
     Established patient visit   Patient: Diana Torres   DOB: 08/15/1990   32 y.o. Female  MRN: [id number] Visit Date: 01/10/2023  Today's healthcare provider: Charlton Amor, DO   No chief complaint on file.   SUBJECTIVE   No chief complaint on file.  HPI  Pt presents for wellness visit. Sign language interpretor present   Pap- due 2027   Review of Systems     No outpatient medications have been marked as taking for the 01/10/23 encounter (Appointment) with Charlton Amor, DO.    OBJECTIVE    There were no vitals taken for this visit.  Physical Exam     ASSESSMENT & PLAN    Problem List Items Addressed This Visit   None   No follow-ups on file.      No orders of the defined types were placed in this encounter.   No orders of the defined types were placed in this encounter.    Charlton Amor, DO  [facility] at [facility] [phone] (phone) [phone] (fax)  [facility]

## 2023-01-17 ENCOUNTER — Other Ambulatory Visit: Payer: Self-pay | Admitting: Family Medicine

## 2023-01-17 DIAGNOSIS — R1013 Epigastric pain: Secondary | ICD-10-CM

## 2023-01-19 ENCOUNTER — Encounter: Payer: Self-pay | Admitting: Family Medicine

## 2023-01-19 ENCOUNTER — Ambulatory Visit (INDEPENDENT_AMBULATORY_CARE_PROVIDER_SITE_OTHER): Payer: Medicaid Other | Admitting: Family Medicine

## 2023-01-19 VITALS — BP 119/85 | HR 69 | Ht 64.0 in | Wt 168.1 lb

## 2023-01-19 DIAGNOSIS — Z0000 Encounter for general adult medical examination without abnormal findings: Secondary | ICD-10-CM | POA: Diagnosis not present

## 2023-01-19 DIAGNOSIS — Z23 Encounter for immunization: Secondary | ICD-10-CM

## 2023-01-19 NOTE — Assessment & Plan Note (Signed)
Doing well, up to date on screenings - getting flu shot today

## 2023-01-19 NOTE — Progress Notes (Signed)
Established patient visit   Patient: Diana Torres   DOB: 1990-04-08   32 y.o. Female  MRN: [id number] Visit Date: 01/19/2023  Today's healthcare provider: Charlton Amor, DO   Chief Complaint  Patient presents with   Annual Exam    SUBJECTIVE    Chief Complaint  Patient presents with   Annual Exam   HPI  Pt presents for wellness visit. Sign language interpretor present   Pap- due 2027  Diet and exercise: doing well  Review of Systems  Constitutional:  Negative for activity change, fatigue and fever.  Respiratory:  Negative for cough and shortness of breath.   Cardiovascular:  Negative for chest pain.  Gastrointestinal:  Negative for abdominal pain.  Genitourinary:  Negative for difficulty urinating.       Current Meds  Medication Sig   dicyclomine (BENTYL) 10 MG capsule Take 1 capsule (10 mg total) by mouth 4 (four) times daily -  before meals and at bedtime.   methocarbamol (ROBAXIN-750) 750 MG tablet Take 1 tablet (750 mg total) by mouth every 6 (six) hours as needed for muscle spasms.   omeprazole (PRILOSEC) 20 MG capsule Take 1 capsule (20 mg total) by mouth daily.    OBJECTIVE    BP 119/85 (BP Location: Left Arm, Patient Position: Sitting, Cuff Size: Normal)   Pulse 69   Ht 5\' 4"  (1.626 m)   Wt 168 lb 1 oz (76.2 kg)   SpO2 98%   BMI 28.85 kg/m   Physical Exam Vitals and nursing note reviewed.  Constitutional:      General: She is not in acute distress.    Appearance: Normal appearance. She is well-developed.  HENT:     Head: Normocephalic and atraumatic.     Right Ear: External ear normal.     Left Ear: External ear normal.     Nose: Nose normal.  Eyes:     Conjunctiva/sclera: Conjunctivae normal.     Pupils: Pupils are equal, round, and reactive to light.  Neck:     Thyroid: No thyromegaly.  Cardiovascular:     Rate and Rhythm: Normal rate and regular rhythm.     Heart sounds: Normal heart sounds.  Pulmonary:     Effort: Pulmonary  effort is normal.     Breath sounds: Normal breath sounds. No wheezing.  Musculoskeletal:     Cervical back: Neck supple.  Lymphadenopathy:     Cervical: No cervical adenopathy.  Skin:    General: Skin is warm and dry.  Neurological:     General: No focal deficit present.     Mental Status: She is alert and oriented to person, place, and time.  Psychiatric:        Mood and Affect: Mood normal.        Behavior: Behavior normal.        Thought Content: Thought content normal.        Judgment: Judgment normal.        ASSESSMENT & PLAN    Problem List Items Addressed This Visit       Other   Routine adult health maintenance - Primary    Doing well, up to date on screenings - getting flu shot today      Relevant Orders   Lipid panel    No follow-ups on file.      No orders of the defined types were placed in this encounter.   Orders Placed This Encounter  Procedures   Lipid  panel     Charlton Amor, DO  [facility] at [facility] [phone] (phone) [phone] (fax)  [facility]

## 2023-01-20 LAB — LIPID PANEL
Chol/HDL Ratio: 3.4 {ratio} (ref 0.0–4.4)
Cholesterol, Total: 163 mg/dL (ref 100–199)
HDL: 48 mg/dL (ref >39)
LDL Chol Calc (NIH): 104 mg/dL — ABNORMAL HIGH (ref 0–99)
Triglycerides: 54 mg/dL (ref 0–149)
VLDL Cholesterol Cal: 11 mg/dL (ref 5–40)

## 2023-01-24 ENCOUNTER — Other Ambulatory Visit: Payer: Medicaid Other

## 2023-01-31 ENCOUNTER — Ambulatory Visit (INDEPENDENT_AMBULATORY_CARE_PROVIDER_SITE_OTHER): Payer: Medicaid Other

## 2023-01-31 DIAGNOSIS — R1013 Epigastric pain: Secondary | ICD-10-CM | POA: Diagnosis not present

## 2023-01-31 MED ORDER — IOHEXOL 300 MG/ML  SOLN
100.0000 mL | Freq: Once | INTRAMUSCULAR | Status: AC | PRN
  Administered 2023-01-31: 100 mL via INTRAVENOUS

## 2023-02-01 ENCOUNTER — Other Ambulatory Visit: Payer: Self-pay | Admitting: Family Medicine

## 2023-02-01 DIAGNOSIS — R1013 Epigastric pain: Secondary | ICD-10-CM

## 2023-02-28 ENCOUNTER — Ambulatory Visit: Payer: Self-pay | Admitting: Family Medicine

## 2023-02-28 DIAGNOSIS — R202 Paresthesia of skin: Secondary | ICD-10-CM | POA: Diagnosis not present

## 2023-02-28 DIAGNOSIS — R0789 Other chest pain: Secondary | ICD-10-CM | POA: Diagnosis not present

## 2023-02-28 DIAGNOSIS — R42 Dizziness and giddiness: Secondary | ICD-10-CM | POA: Diagnosis not present

## 2023-02-28 DIAGNOSIS — M67432 Ganglion, left wrist: Secondary | ICD-10-CM | POA: Diagnosis not present

## 2023-02-28 DIAGNOSIS — M542 Cervicalgia: Secondary | ICD-10-CM | POA: Diagnosis not present

## 2023-02-28 NOTE — Telephone Encounter (Signed)
Copied from CRM [id number]. Topic: Appointments - Appointment Scheduling >> Feb 28, 2023 10:45 AM Diana Torres wrote: Patient/patient representative is calling to schedule an appointment. Refer to attachments for appointment information. Patient is stating pain and numbness from neck down to shoulders with labored breathing.  Chief Complaint: left side numbness, tingling going down neck to shoulder Symptoms: numbness, tingling and pain Frequency: constant Pertinent Negatives: Patient denies severe pain Disposition: [x] ED /[x] Urgent Care (no appt availability in office) / [] Appointment(In office/virtual)/ []  [facility]/ [] Home Care/ [] Refused Recommended Disposition /[] [place] [facility]/ []  Follow-up with PCP Additional Notes: patient called (has interpreter).  Called to c/o numbness and tingling to neck down to shoulder on left side.  States it starts by the back of her ear on the left.  Reports pain is mild but is becoming worse.  States pain is sharp at times and that she experiences labored breathing during episodes.  Instructed patient to head to ER/Urgent care.  Reason for Disposition  Patient sounds very sick or weak to the triager  Answer Assessment - Initial Assessment Questions 1. ONSET: "When did the pain start?"     About a week ago 2. LOCATION: "Where is the pain located?"     Left side of neck 3. PAIN: "How bad is the pain?" (Scale 1-10; or mild, moderate, severe)   - MILD (1-3): Doesn't interfere with normal activities.   - MODERATE (4-7): Interferes with normal activities (e.g., work or school) or awakens from sleep.   - SEVERE (8-10): Excruciating pain, unable to do any normal activities, unable to hold a cup of water.     Mild now but is increasing. 5. CAUSE: "What do you think is causing the arm pain?"     Don't know 6. OTHER SYMPTOMS: "Do you have any other symptoms?" (e.g., neck pain, swelling, rash, fever, numbness, weakness)     numbness  Protocols used:  Arm Pain-A-AH

## 2023-03-22 ENCOUNTER — Ambulatory Visit: Payer: Medicaid Other

## 2023-03-22 ENCOUNTER — Ambulatory Visit: Payer: Self-pay | Admitting: Family Medicine

## 2023-03-22 ENCOUNTER — Ambulatory Visit
Admission: RE | Admit: 2023-03-22 | Discharge: 2023-03-22 | Disposition: A | Discharge status: Home / Self Care | Payer: Medicaid Other | Source: Ambulatory Visit | Attending: Emergency Medicine | Admitting: Emergency Medicine

## 2023-03-22 VITALS — BP 128/84 | HR 81 | Temp 98.8°F | Resp 16

## 2023-03-22 DIAGNOSIS — R21 Rash and other nonspecific skin eruption: Secondary | ICD-10-CM | POA: Diagnosis not present

## 2023-03-22 DIAGNOSIS — M25571 Pain in right ankle and joints of right foot: Secondary | ICD-10-CM

## 2023-03-22 MED ORDER — TRIAMCINOLONE ACETONIDE 0.1 % EX CREA: 1.0000 | TOPICAL_CREAM | Freq: Two times a day (BID) | CUTANEOUS | 0 refills | Status: DC

## 2023-03-22 NOTE — Telephone Encounter (Signed)
Answer Assessment - Initial Assessment Questions 1. ONSET: "When did the pain start?"      *No Answer* 2. LOCATION: "Where is the pain located?"     Right ankle 3. PAIN: "How bad is the pain?"    (Scale 1-10; or mild, moderate, severe)  - MILD (1-3): doesn't interfere with normal activities.   - MODERATE (4-7): interferes with normal activities (e.g., work or school) or awakens from sleep, limping.   - SEVERE (8-10): excruciating pain, unable to do any normal activities, unable to walk.      4- tender to touch 4. WORK OR EXERCISE: "Has there been any recent work or exercise that involved this part of the body?"      none 5. CAUSE: "What do you think is causing the foot pain?"     No ideas- noticed while in shower 6. OTHER SYMPTOMS: "Do you have any other symptoms?" (e.g., leg pain, rash, fever, numbness)     none 7. PREGNANCY: "Is there any chance you are pregnant?" "When was your last menstrual period?"     na  Protocols used: Foot Pain-A-AH  Warm to touch, swollen at joint     Chief Complaint: tender right ankle Symptoms: sore to touch; black dots around ankle bone Frequency: noticed while in shower  Disposition: [] ED /[x] Urgent Care (no appt availability in office) / [] Appointment(In office/virtual)/ []  [facility]/ [] Home Care/ [] Refused Recommended Disposition /[] [facility]/ []  Follow-up with PCP Additional notes: Spoke with interpreter for pt during triage. Pt said not clear as to why this happened. Sore to touch. Interpreter felt ankle and stated it felt warm and swollen at joint.  Pt going to urgent care at 1500 at [facility].

## 2023-03-22 NOTE — ED Provider Notes (Signed)
[facility]    CSN: [id number] Arrival date & time: 03/22/23  1502    HISTORY   Chief Complaint  Patient presents with   Ankle Pain    pt will need interpreter for appt. - Entered by patient   HPI Diana Torres is a pleasant, 32 y.o. female who presents to urgent care today. Patient states that while shaving her legs this morning she noticed a dark spot on her right ankle as well as a dark spot on top of her foot.  Patient states she did not injure her foot and is unsure how the dark spots occurred.  Patient states she is never had similar dark spots in the past.  On arrival today, patient is wearing an ankle bracelet that appears to be made of a yellow metal alloy.  Patient states she has around the bracelet for about a year and has been wearing it every day.  Patient denies pain to her left ankle or to the top of her foot.  The history is provided by the patient.   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   with pregnancy   Mass    cyst/left upper abd quad   Miscarriage    pt states had difficulty with high BP prior to miscarriage and afterwards till infection was healed   PONV (postoperative nausea and vomiting)    UTI (lower urinary tract infection)    Patient Active Problem List   Diagnosis Date Noted   Routine adult health maintenance 01/19/2023   Epigastric abdominal pain 01/05/2023   Immunity to measles, mumps, and rubella determined by serologic test 06/15/2022   Immunity status testing 06/15/2022   Routine health maintenance 12/10/2021   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   Hearing impaired 08/14/2012   Past Surgical History:  Procedure Laterality Date   COCHLEAR IMPLANT     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   Procedure: LAPAROSCOPIC SPLEEN SPARING AND DISTAL PANCRETECTOMY ;  Surgeon: Almond Lint, MD;  Location: [facility];  Service: General;  Laterality: N/A;   LAPAROSCOPIC TUBAL LIGATION  Bilateral 07/08/2016   Procedure: LAPAROSCOPIC TUBAL LIGATION WITH FILSHIE CLIPS;  Surgeon: Tereso Newcomer, MD;  Location: [facility];  Service: Gynecology;  Laterality: Bilateral;   RHINOPLASTY     right foot  2016   cyst removed   TONSILLECTOMY     OB History     Gravida  3   Para  2   Term  2   Preterm      AB  1   Living  2      SAB  1   IAB      Ectopic      Multiple  0   Live Births  2          Home Medications    Prior to Admission medications   Medication Sig Start Date End Date Taking? Authorizing Provider  triamcinolone cream (KENALOG) 0.1 % Apply 1 Application topically 2 (two) times daily. Apply to affected area(s) twice daily , do not apply to face. 03/22/23  Yes Theadora Rama Scales, PA-C  hyoscyamine (LEVSIN SL) 0.125 MG SL tablet Place 1 tablet (0.125 mg total) under the tongue every 6 (six) hours as needed. Patient not taking: Reported on 04/12/2018 08/08/17 03/19/19  Sherrilyn Rist, MD  methscopolamine (PAMINE) 2.5 MG TABS tablet Take 1 tablet (2.5 mg total) by mouth 2 (two)  times daily as needed. Patient not taking: Reported on 04/12/2018 09/23/17 03/19/19  Sherrilyn Rist, MD    Family History Family History  Problem Relation Age of Onset   Hearing loss Mother    Hypertension Mother    Alcohol abuse Father    Hearing loss Father    Pancreatic cancer Maternal Grandmother    Social History Social History   Tobacco Use   Smoking status: Never   Smokeless tobacco: Never  Vaping Use   Vaping status: Never Used  Substance Use Topics   Alcohol use: Yes    Comment: Social   Drug use: No   Allergies   Codeine  Review of Systems Review of Systems Pertinent findings revealed after performing a 14 point review of systems has been noted in the history of present illness.  Physical Exam Vital Signs BP 128/84 (BP Location: Right Arm)   Pulse 81   Temp 98.8 F (37.1 C) (Oral)   Resp 16   LMP 03/08/2023   SpO2 97%   No data  found.  Physical Exam Vitals and nursing note reviewed.  Constitutional:      General: She is not in acute distress.    Appearance: Normal appearance.  HENT:     Head: Normocephalic and atraumatic.  Eyes:     Pupils: Pupils are equal, round, and reactive to light.  Cardiovascular:     Rate and Rhythm: Normal rate and regular rhythm.  Pulmonary:     Effort: Pulmonary effort is normal.     Breath sounds: Normal breath sounds.  Musculoskeletal:        General: Normal range of motion.     Cervical back: Normal range of motion and neck supple.       Feet:  Skin:    General: Skin is warm and dry.     Findings: Rash (Small area of dry, darkened skin on the lateral aspect of the right lateral malleolus without scale, erythema, drainage or tenderness to palpation) present.  Neurological:     General: No focal deficit present.     Mental Status: She is alert and oriented to person, place, and time. Mental status is at baseline.  Psychiatric:        Mood and Affect: Mood normal.        Behavior: Behavior normal.        Thought Content: Thought content normal.        Judgment: Judgment normal.     Visual Acuity Right Eye Distance:   Left Eye Distance:   Bilateral Distance:    Right Eye Near:   Left Eye Near:    Bilateral Near:     UC Couse / Diagnostics / Procedures:     Radiology No results found.  Procedures Procedures (including critical care time) EKG  Pending results:  Labs Reviewed - No data to display  Medications Ordered in UC: Medications - No data to display  UC Diagnoses / Final Clinical Impressions(s)   I have reviewed the triage vital signs and the nursing notes.  Pertinent labs & imaging results that were available during my care of the patient were reviewed by me and considered in my medical decision making (see chart for details).    Final diagnoses:  Rash and nonspecific skin eruption   Patient reassured that the dark spot on top of her foot is  a vein that is part of her anatomy and requires no treatment and that the dark spots on the  side of her ankle are likely an atopic rash secondary to bracelet around her ankle.  Patient advised to remove the bracelet and apply triamcinolone cream twice daily until the rash is resolved.  Follow-up with PCP if no improvement.  Please see discharge instructions below for details of plan of care as provided to patient. ED Prescriptions     Medication Sig Dispense Auth. Provider   triamcinolone cream (KENALOG) 0.1 % Apply 1 Application topically 2 (two) times daily. Apply to affected area(s) twice daily , do not apply to face. 30 g Theadora Rama Scales, PA-C      PDMP not reviewed this encounter.  Pending results:  Labs Reviewed - No data to display    Discharge Instructions      To treat the rash on your ankle, please avoid wearing your ankle bracelet for at least a week and apply triamcinolone cream to the area twice daily.  If the rash does not resolve, please follow-up with your primary care provider.  Thank you for visiting  Urgent Care today.    Disposition Upon Discharge:  Condition: stable for discharge home  Patient presented with an acute illness with associated systemic symptoms and significant discomfort requiring urgent management. In my opinion, this is a condition that a prudent lay person (someone who possesses an average knowledge of health and medicine) may potentially expect to result in complications if not addressed urgently such as respiratory distress, impairment of bodily function or dysfunction of bodily organs.   Routine symptom specific, illness specific and/or disease specific instructions were discussed with the patient and/or caregiver at length.   As such, the patient has been evaluated and assessed, work-up was performed and treatment was provided in alignment with urgent care protocols and evidence based medicine.  Patient/parent/caregiver has  been advised that the patient may require follow up for further testing and treatment if the symptoms continue in spite of treatment, as clinically indicated and appropriate.  Patient/parent/caregiver has been advised to return to the [facility] or PCP if no better; to PCP or the Emergency Department if new signs and symptoms develop, or if the current signs or symptoms continue to change or worsen for further workup, evaluation and treatment as clinically indicated and appropriate  The patient will follow up with their current PCP if and as advised. If the patient does not currently have a PCP we will assist them in obtaining one.   The patient may need specialty follow up if the symptoms continue, in spite of conservative treatment and management, for further workup, evaluation, consultation and treatment as clinically indicated and appropriate.  Patient/parent/caregiver verbalized understanding and agreement of plan as discussed.  All questions were addressed during visit.  Please see discharge instructions below for further details of plan.  This office note has been dictated using Teaching laboratory technician.  Unfortunately, this method of dictation can sometimes lead to typographical or grammatical errors.  I apologize for your inconvenience in advance if this occurs.  Please do not hesitate to reach out to me if clarification is needed.      Theadora Rama Scales, [place] 03/22/23 [id number]

## 2023-03-22 NOTE — ED Triage Notes (Signed)
Pt presents with right ankle pain. Reports she was shaving her leg this morning and noticed a dark spot on her right ankle and a dark spot. She is unsure what happened or when it happened.

## 2023-03-22 NOTE — Discharge Instructions (Signed)
To treat the rash on your ankle, please avoid wearing your ankle bracelet for at least a week and apply triamcinolone cream to the area twice daily.  If the rash does not resolve, please follow-up with your primary care provider.  Thank you for visiting [name] today.

## 2023-04-03 ENCOUNTER — Encounter: Payer: Self-pay | Admitting: Emergency Medicine

## 2023-04-03 ENCOUNTER — Ambulatory Visit
Admission: EM | Admit: 2023-04-03 | Discharge: 2023-04-03 | Disposition: A | Discharge status: Home / Self Care | Payer: Medicaid Other | Attending: Internal Medicine | Admitting: Internal Medicine

## 2023-04-03 DIAGNOSIS — K047 Periapical abscess without sinus: Secondary | ICD-10-CM

## 2023-04-03 DIAGNOSIS — T85848D Pain due to other internal prosthetic devices, implants and grafts, subsequent encounter: Secondary | ICD-10-CM | POA: Diagnosis not present

## 2023-04-03 DIAGNOSIS — K0889 Other specified disorders of teeth and supporting structures: Secondary | ICD-10-CM

## 2023-04-03 MED ORDER — KETOROLAC TROMETHAMINE 30 MG/ML IJ SOLN
30.0000 mg | Freq: Once | INTRAMUSCULAR | Status: AC
  Administered 2023-04-03: 30 mg via INTRAMUSCULAR

## 2023-04-03 MED ORDER — TRAMADOL HCL 50 MG PO TABS: 50.0000 mg | ORAL_TABLET | Freq: Four times a day (QID) | ORAL | 0 refills | Status: DC | PRN

## 2023-04-03 NOTE — Discharge Instructions (Signed)
Stop the tylenol with codeine. Do not combine with the medication you were sent today. You were prescribed a short course of pain medication. Do not mix with alcohol or other illicit drugs, do not drive or operate machinery, and refrain from any activity that will require complete attention while taking this medication. You should not combine it with the tylenol with codeine. By law I can only prescribe a short course, if more medication is needed you will need to see your PCP or dentist.  t is very important that you see a dentist as soon as possible. The infection will continue to reoccur if the problem is not fixed by a dentist.  You were given an injection (Toradol) for your pain today. This will help with the pain and inflammation. Do not take any more NSAIDs today given your injection. NSAIDs include ibuprofen, motrin, aleve, advil, ect.

## 2023-04-03 NOTE — ED Provider Notes (Signed)
[facility]  Note:  This document was prepared using Dragon voice recognition software and may include unintentional dictation errors.  MRN: [id number] DOB: 1990/06/04 DATE: 04/03/23   Subjective:  Chief Complaint:  Chief Complaint  Patient presents with   Dental Pain     HPI: Diana Torres is a 32 y.o. female presenting for dental pain for one week. Interpretive services used for this encounter. Patient states she was seen at her dentist about one week ago after having left sided dental pain. At that time, she was diagnosed with a dental infection that needed surgery. She was started on Amoxicillin and prescribed ibuprofen 800mg  and tylenol with codeine.  Patient states he has been taking ibuprofen as directed, but the pain suddenly became worse today.  She feels that the ibuprofen is no longer working.  She is been alternating with regular over-the-counter Tylenol.  She realized when she picked up her prescription for Tylenol with codeine that she is allergic to codeine and has not been taking it.  She has upcoming surgery for extraction on Tuesday.  She reports difficulty with eating due to the pain.  Denies fever, nausea/vomiting. Endorses dental pain, jaw pain. Presents NAD.  Prior to Admission medications   Medication Sig Start Date End Date Taking? Authorizing Provider  triamcinolone cream (KENALOG) 0.1 % Apply 1 Application topically 2 (two) times daily. Apply to affected area(s) twice daily , do not apply to face. 03/22/23   Theadora Rama Scales, PA-C  hyoscyamine (LEVSIN SL) 0.125 MG SL tablet Place 1 tablet (0.125 mg total) under the tongue every 6 (six) hours as needed. Patient not taking: Reported on 04/12/2018 08/08/17 03/19/19  Sherrilyn Rist, MD  methscopolamine (PAMINE) 2.5 MG TABS tablet Take 1 tablet (2.5 mg total) by mouth 2 (two) times daily as needed. 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   Procedure: LAPAROSCOPIC SPLEEN SPARING AND DISTAL PANCRETECTOMY ;  Surgeon: Almond Lint, MD;  Location: [facility];  Service: General;  Laterality: N/A;   LAPAROSCOPIC TUBAL LIGATION Bilateral 07/08/2016   Procedure: LAPAROSCOPIC TUBAL LIGATION WITH FILSHIE CLIPS;  Surgeon: Tereso Newcomer, MD;  Location: [facility];  Service: Gynecology;  Laterality: Bilateral;   RHINOPLASTY     right foot  2016   cyst removed   TONSILLECTOMY      Family History  Problem Relation Age of Onset   Hearing loss Mother    Hypertension Mother    Alcohol abuse Father    Hearing loss Father    Pancreatic cancer Maternal Grandmother     Social History   Tobacco Use   Smoking status: Never   Smokeless tobacco: Never  Vaping Use   Vaping status: Never Used  Substance Use Topics   Alcohol use: Yes  Comment: Social   Drug use: No    Review of Systems  Constitutional:  Negative for fever.  HENT:  Positive for dental problem.   Gastrointestinal:  Negative for nausea and vomiting.     Objective:   Vitals: BP (!) 138/99 (BP Location: Right Arm)   Pulse 84   Temp 98.4 F (36.9 C) (Oral)   Resp 16   LMP 03/08/2023   SpO2 98%   Physical Exam Constitutional:      General: She is not in acute distress.    Appearance: Normal appearance. She is well-developed and normal weight. She is not ill-appearing or toxic-appearing.     Comments: Patient is deaf  HENT:     Head:  Normocephalic and atraumatic.     Jaw: Tenderness and pain on movement present.     Comments: Tenderness palpation of the left upper jaw and pain with opening and closing.    Mouth/Throat:     Dentition: Abnormal dentition. Dental tenderness present.      Comments: Tooth breakdown noted on left upper third molar. Cardiovascular:     Rate and Rhythm: Normal rate and regular rhythm.     Heart sounds: Normal heart sounds.  Pulmonary:     Effort: Pulmonary effort is normal.     Breath sounds: Normal breath sounds.     Comments: Clear to auscultation bilaterally  Abdominal:     General: Bowel sounds are normal.     Palpations: Abdomen is soft.     Tenderness: There is no abdominal tenderness.  Skin:    General: Skin is warm and dry.  Neurological:     General: No focal deficit present.     Mental Status: She is alert.  Psychiatric:        Mood and Affect: Mood and affect normal.     Results:  Labs: No results found for this or any previous visit (from the past 24 hours).  Radiology: No results found.   UC Course/Treatments:  Procedures: Procedures   Medications Ordered in UC: Medications  ketorolac (TORADOL) 30 MG/ML injection 30 mg (30 mg Intramuscular Given 04/03/23 1512)     Assessment and Plan :     ICD-10-CM   1. Dental infection  K04.7     2. Pain, dental  K08.89      Dental infection Afebrile, nontoxic-appearing, NAD. VSS. Previously diagnosed Continue ABX as directed and follow-up with dentist soon as possible.  Patient was given a Toradol 30 mg IM injection today in office to help with pain. She was also given a prescription for Tramadol 50 mg every 6 hours as needed for severe pain.  She was instructed to not take the Tylenol with codeine. Discussed risks. Advised patient not to mix with other products containing acetaminophen, not to combine with alcohol or other illicit drugs, not to drive or operate machinery, and to refrain from any activity that will  require complete attention while taking this medication. Strict ED precautions were given and patient verbalized understanding.  Dental pain Afebrile, nontoxic-appearing, NAD. VSS. Previously diagnosed Continue ABX as directed and follow-up with dentist soon as possible.  Patient was given a Toradol 30 mg IM injection today in office to help with pain. She was also given a prescription for Tramadol 50 mg every 6 hours as needed for severe pain.  She was instructed to not take the Tylenol with codeine. Discussed risks. Advised patient not to mix with other products containing acetaminophen, not to combine with alcohol  or other illicit drugs, not to drive or operate machinery, and to refrain from any activity that will require complete attention while taking this medication. Strict ED precautions were given and patient verbalized understanding.   ED Discharge Orders          Ordered    traMADol (ULTRAM) 50 MG tablet  Every 6 hours PRN        04/03/23 1516             PDMP not reviewed this encounter.     Cynda Acres, PA-C 04/03/23 1634

## 2023-04-03 NOTE — ED Triage Notes (Signed)
Pt reports dental pain x 1 week. States the pain is throbbing. Has been taking Abx due to an infection, Tylenol with codeine and Ibuprofen. States the medication is now not working and she realized she has been taking Tylenol with codeine which she is allergic too.

## 2023-05-12 ENCOUNTER — Ambulatory Visit: Payer: Medicaid Other

## 2023-05-12 ENCOUNTER — Ambulatory Visit (INDEPENDENT_AMBULATORY_CARE_PROVIDER_SITE_OTHER): Payer: Medicaid Other | Admitting: Family Medicine

## 2023-05-12 VITALS — BP 117/79 | HR 71 | Ht 64.0 in | Wt 163.8 lb

## 2023-05-12 DIAGNOSIS — N200 Calculus of kidney: Secondary | ICD-10-CM | POA: Diagnosis not present

## 2023-05-12 DIAGNOSIS — R1084 Generalized abdominal pain: Secondary | ICD-10-CM | POA: Diagnosis not present

## 2023-05-12 DIAGNOSIS — R1013 Epigastric pain: Secondary | ICD-10-CM | POA: Diagnosis not present

## 2023-05-12 MED ORDER — TRAMADOL HCL 50 MG PO TABS: 50.0000 mg | ORAL_TABLET | Freq: Four times a day (QID) | ORAL | 0 refills | Status: DC | PRN

## 2023-05-12 NOTE — Assessment & Plan Note (Addendum)
 Pain localized to epigastric area. She was referred to GI back in October and has not been contacted yet. I am wondering if she has some pancreatic insufficiency at this point. Will go ahead and get blood work to rule out infection and pancreatitis  - pt says tramadol  was the only thing that helped so I have gone ahead and refilled. Pmp verified with no red flags.

## 2023-05-12 NOTE — Progress Notes (Signed)
 Acute Office Visit  Subjective:     Patient ID: Declyn Offield, female    DOB: 09-Sep-1990, 33 y.o.   MRN: [id number]  Chief Complaint  Patient presents with   Abdominal Pain    X2-3 wks    HPI Patient is in today for acute abdominal pain. Pt is a sign language patient and no in person interpretor is available. Stratus is being used to communicate. She has been seen for stomach pain in the past. She was referred to GI back in October but has not heard about an appointment yet. She says   Review of Systems  Constitutional:  Negative for chills and fever.  Respiratory:  Negative for cough and shortness of breath.   Cardiovascular:  Negative for chest pain.  Gastrointestinal:  Positive for abdominal pain.  Neurological:  Negative for headaches.        Objective:    BP 117/79 (BP Location: Left Arm, Patient Position: Sitting, Cuff Size: Normal)   Pulse 71   Ht 5' 4 (1.626 m)   Wt 163 lb 12 oz (74.3 kg)   SpO2 98%   BMI 28.11 kg/m    Physical Exam Vitals and nursing note reviewed.  Constitutional:      General: She is not in acute distress.    Appearance: Normal appearance.  HENT:     Head: Normocephalic and atraumatic.     Right Ear: External ear normal.     Left Ear: External ear normal.     Nose: Nose normal.  Eyes:     Conjunctiva/sclera: Conjunctivae normal.  Cardiovascular:     Rate and Rhythm: Normal rate and regular rhythm.  Pulmonary:     Effort: Pulmonary effort is normal.     Breath sounds: Normal breath sounds.  Abdominal:     General: Abdomen is flat. Bowel sounds are normal.     Palpations: Abdomen is soft.     Tenderness: There is abdominal tenderness in the epigastric area.  Neurological:     General: No focal deficit present.     Mental Status: She is alert and oriented to person, place, and time.  Psychiatric:        Mood and Affect: Mood normal.        Behavior: Behavior normal.        Thought Content: Thought content normal.         Judgment: Judgment normal.     No results found for any visits on 05/12/23.      Assessment & Plan:   Problem List Items Addressed This Visit       Other   Generalized abdominal pain - Primary   Pain localized to epigastric area. She was referred to GI back in October and has not been contacted yet. I am wondering if she has some pancreatic insufficiency at this point. Will go ahead and get blood work to rule out infection and pancreatitis  - pt says tramadol  was the only thing that helped so I have gone ahead and refilled. Pmp verified with no red flags.      Relevant Orders   CBC with Differential   Lipase   CMP14+EGFR   DG Abd 1 View    Meds ordered this encounter  Medications   traMADol  (ULTRAM ) 50 MG tablet    Sig: Take 1 tablet (50 mg total) by mouth every 6 (six) hours as needed.    Dispense:  12 tablet    Refill:  0  Return if symptoms worsen or fail to improve.  Bernice GORMAN [place], DO

## 2023-05-13 ENCOUNTER — Encounter: Payer: Self-pay | Admitting: Family Medicine

## 2023-05-13 LAB — CMP14+EGFR
ALT: 9 [IU]/L (ref 0–32)
AST: 13 [IU]/L (ref 0–40)
Albumin: 4.3 g/dL (ref 3.9–4.9)
Alkaline Phosphatase: 59 [IU]/L (ref 44–121)
BUN/Creatinine Ratio: 10 (ref 9–23)
BUN: 7 mg/dL (ref 6–20)
Bilirubin Total: 0.3 mg/dL (ref 0.0–1.2)
CO2: 23 mmol/L (ref 20–29)
Calcium: 9.6 mg/dL (ref 8.7–10.2)
Chloride: 104 mmol/L (ref 96–106)
Creatinine, Ser: 0.71 mg/dL (ref 0.57–1.00)
Globulin, Total: 2.3 g/dL (ref 1.5–4.5)
Glucose: 85 mg/dL (ref 70–99)
Potassium: 4.2 mmol/L (ref 3.5–5.2)
Sodium: 141 mmol/L (ref 134–144)
Total Protein: 6.6 g/dL (ref 6.0–8.5)
eGFR: 117 mL/min/{1.73_m2} (ref >59)

## 2023-05-13 LAB — CBC WITH DIFFERENTIAL/PLATELET
Basophils Absolute: 0.1 10*3/uL (ref 0.0–0.2)
Basos: 1 %
EOS (ABSOLUTE): 0.3 10*3/uL (ref 0.0–0.4)
Eos: 4 %
Hematocrit: 38.5 % (ref 34.0–46.6)
Hemoglobin: 12.6 g/dL (ref 11.1–15.9)
Immature Grans (Abs): 0 10*3/uL (ref 0.0–0.1)
Immature Granulocytes: 0 %
Lymphocytes Absolute: 1.8 10*3/uL (ref 0.7–3.1)
Lymphs: 29 %
MCH: 29.2 pg (ref 26.6–33.0)
MCHC: 32.7 g/dL (ref 31.5–35.7)
MCV: 89 fL (ref 79–97)
Monocytes Absolute: 0.4 10*3/uL (ref 0.1–0.9)
Monocytes: 6 %
Neutrophils Absolute: 3.6 10*3/uL (ref 1.4–7.0)
Neutrophils: 60 %
Platelets: 274 10*3/uL (ref 150–450)
RBC: 4.31 x10E6/uL (ref 3.77–5.28)
RDW: 11.8 % (ref 11.7–15.4)
WBC: 6.1 10*3/uL (ref 3.4–10.8)

## 2023-05-13 LAB — LIPASE: Lipase: 30 U/L (ref 14–72)

## 2023-05-15 ENCOUNTER — Encounter: Payer: Self-pay | Admitting: Family Medicine

## 2023-05-23 ENCOUNTER — Other Ambulatory Visit (HOSPITAL_COMMUNITY)
Admission: RE | Admit: 2023-05-23 | Discharge: 2023-05-23 | Disposition: A | Discharge status: Home / Self Care | Payer: Medicaid Other | Source: Ambulatory Visit | Attending: Obstetrics and Gynecology | Admitting: Obstetrics and Gynecology

## 2023-05-23 ENCOUNTER — Ambulatory Visit (INDEPENDENT_AMBULATORY_CARE_PROVIDER_SITE_OTHER): Payer: Medicaid Other | Admitting: Obstetrics and Gynecology

## 2023-05-23 ENCOUNTER — Encounter: Payer: Self-pay | Admitting: Obstetrics and Gynecology

## 2023-05-23 VITALS — BP 117/79 | HR 76 | Ht 64.0 in | Wt 164.0 lb

## 2023-05-23 DIAGNOSIS — Z01419 Encounter for gynecological examination (general) (routine) without abnormal findings: Secondary | ICD-10-CM | POA: Insufficient documentation

## 2023-05-23 DIAGNOSIS — R87610 Atypical squamous cells of undetermined significance on cytologic smear of cervix (ASC-US): Secondary | ICD-10-CM | POA: Insufficient documentation

## 2023-05-23 DIAGNOSIS — R87619 Unspecified abnormal cytological findings in specimens from cervix uteri: Secondary | ICD-10-CM

## 2023-05-23 DIAGNOSIS — R102 Pelvic and perineal pain: Secondary | ICD-10-CM

## 2023-05-23 NOTE — Patient Instructions (Addendum)
[facility] [address]  [place], [place] [address]  Appointments  [phone]

## 2023-05-23 NOTE — Progress Notes (Signed)
ANNUAL EXAM Patient name: Diana Torres MRN [id number]  Date of birth: [birth year] Chief Complaint:   No chief complaint on file.  History of Present Illness:   Diana Torres is a 33 y.o. [phone] female being seen today for a routine annual exam.   Current concerns:  She has pelvic pain that is irregular (not associated only with menstrual cycle) since having her tubal in 2018. She has tried PFPT but did not feel like she had sufficient improvement and did not wish to go back to that location. Of note at her last PT appt, she did have similar pain to what I have elicited on exam (levator). We discussed previously about seeing Dr. Briscoe Deutscher our MIGS specialist vs Dr. Jamse Arn. Her abdominal imaging has been normal (she has had CT and TVUS).  Her mom had endometriosis.   Current birth control: tubal ligation  Patient's last menstrual period was 04/22/2023.  Last Pap/Pap History:  S/p Gardasil 2017: normal 2020: Normal/neg 12/2021: LSIL/HPV pos, non 16/18> colpo w/ECC neg  Review of Systems:   Pertinent items are noted in HPI Denies any headaches, blurred vision, fatigue, shortness of breath, chest pain, abnormal vaginal discharge/itching/odor/irritation, problems with periods, bowel movements, urination, or intercourse unless otherwise stated above.  Pertinent History Reviewed:  Reviewed past medical,surgical, social and family history.  Reviewed problem list, medications and allergies. Physical Assessment:   Vitals:   05/23/23 1305  BP: 117/79  Pulse: 76  Weight: 164 lb (74.4 kg)  Height: 5\' 4"  (1.626 m)  Body mass index is 28.15 kg/m.   Physical Examination:  General appearance - well appearing, and in no distress Mental status - alert, oriented to person, place, and time Psych:  She has a normal mood and affect Skin - warm and dry, normal color, no suspicious lesions noted Chest - effort normal Heart - normal rate  Breasts - breasts appear normal, no suspicious masses,  no skin or nipple changes or axillary nodes Abdomen - soft, nontender, nondistended, no masses or organomegaly Pelvic -  VULVA: normal appearing vulva with no masses, tenderness or lesions  VAGINA: normal appearing vagina with normal color and discharge, no lesions, bilateral levator tenderness. No obturator tenderness. Bladder was nontender.  CERVIX: normal appearing cervix without discharge or lesions, no CMT UTERUS: uterus is felt to be normal size, shape, consistency and nontender  ADNEXA: No adnexal masses or tenderness noted. Extremities:  No swelling or varicosities noted  Chaperone present for exam  No results found for this or any previous visit (from the past 24 hours).  Assessment & Plan:  Diagnoses and all orders for this visit:  Encounter for annual routine gynecological examination - Cervical cancer screening: Discussed guidelines. Pap with HPV done - Gardasil: completed - GC/CT: accepts, along with blood work.  - Birth Control: BTL with [facility] - Breast Health: Encouraged self breast awareness/SBE. Teaching provided.  - F/U 12 months and prn -     Cytology - PAP -     HIV antibody (with reflex) -     Hepatitis B Surface AntiGEN -     Hepatitis C Antibody -     RPR -     Ambulatory referral to Physical Therapy  Abnormal cervical Papanicolaou smear, unspecified abnormal pap finding -     Cytology - PAP  Pelvic pain Discussed could be related to tubal but less likely. Still interested in reversal. Discussed can see Dr. Jamse Arn who also can do surgery for endometriosis if present. Reviewed, if she  cannot do this surgery (due to non-coverage by insurance), to let me know and I will refer to Dr. Briscoe Deutscher.  Also discussed doing PFPT at new location - she will try location at [facility]. -     Ambulatory referral to Physical Therapy   ASL interpreter used throughout the visit.   Orders Placed This Encounter  Procedures   HIV antibody (with reflex)   Hepatitis B Surface  AntiGEN   Hepatitis C Antibody   RPR   Ambulatory referral to Physical Therapy    Meds: No orders of the defined types were placed in this encounter.   Follow-up: No follow-ups on file.  Milas Hock, MD 05/23/2023 1:42 PM

## 2023-05-23 NOTE — Progress Notes (Signed)
Pt reports cramping even when not on period

## 2023-05-24 LAB — HEPATITIS B SURFACE ANTIGEN: Hepatitis B Surface Ag: NEGATIVE

## 2023-05-24 LAB — HEPATITIS C ANTIBODY: Hep C Virus Ab: NONREACTIVE

## 2023-05-24 LAB — HIV ANTIBODY (ROUTINE TESTING W REFLEX): HIV Screen 4th Generation wRfx: NONREACTIVE

## 2023-05-24 LAB — RPR: RPR Ser Ql: NONREACTIVE

## 2023-05-26 ENCOUNTER — Encounter: Payer: Self-pay | Admitting: Obstetrics and Gynecology

## 2023-05-26 LAB — CYTOLOGY - PAP
Chlamydia: NEGATIVE
Comment: NEGATIVE
Comment: NEGATIVE
Comment: NEGATIVE
Comment: NORMAL
Diagnosis: NEGATIVE
Diagnosis: REACTIVE
High risk HPV: NEGATIVE
Neisseria Gonorrhea: NEGATIVE
Trichomonas: NEGATIVE

## 2023-06-01 ENCOUNTER — Encounter: Payer: Self-pay | Admitting: Gastroenterology

## 2023-06-06 DIAGNOSIS — H903 Sensorineural hearing loss, bilateral: Secondary | ICD-10-CM | POA: Diagnosis not present

## 2023-06-08 ENCOUNTER — Other Ambulatory Visit: Payer: Self-pay | Admitting: Family Medicine

## 2023-06-08 NOTE — Telephone Encounter (Signed)
 Copied from CRM [id number]. Topic: Clinical - Medication Refill >> Jun 08, 2023  3:09 PM Nila Nephew wrote: Most Recent Primary Care Visit:  Provider: Charlton Amor  Department: [facility]  Visit Type: ACUTE  Date: 05/12/2023  Medication:  traMADol (ULTRAM) 50 MG tablet    Has the patient contacted their pharmacy? Yes  Is this the correct pharmacy for this prescription? Yes If no, delete pharmacy and type the correct one.  This is the patient's preferred pharmacy:  [id number] - [place], [place] - [address] AT [facility] [address] [place] [address] Phone: [phone] Fax: [phone]   Has the prescription been filled recently? No  Is the patient out of the medication? Yes  Has the patient been seen for an appointment in the last year OR does the patient have an upcoming appointment? Yes  Can we respond through MyChart? Yes  Agent: Please be advised that Rx refills may take up to 3 business days. We ask that you follow-up with your pharmacy.

## 2023-06-09 ENCOUNTER — Other Ambulatory Visit: Payer: Self-pay | Admitting: Family Medicine

## 2023-06-10 ENCOUNTER — Telehealth: Payer: Self-pay

## 2023-06-10 ENCOUNTER — Other Ambulatory Visit: Payer: Self-pay

## 2023-06-10 MED ORDER — TRAMADOL HCL 50 MG PO TABS: 50.0000 mg | ORAL_TABLET | Freq: Four times a day (QID) | ORAL | 0 refills | Status: DC | PRN

## 2023-06-10 NOTE — Telephone Encounter (Signed)
 Copied from CRM [id number]. Topic: General - Other >> Jun 10, 2023 12:30 PM Nila Nephew wrote: Reason for CRM: Patient calling to state that she got a [employer]. Patient is refusing to specify regarding the message and appears to have not read or not understood it. No message documented in chart. Notified patient that we do not have the message and therefore I cannot advise on the contents of the message. Patient demanded I find out if her medicine is ready or not through our clinic. Attempted to advise patient the pharmacy should be able to indicate pickup status and that if message indicated it has been sent in, it should be but patient did not express understanding and continued to request I find out from her PCP. Please advise patient if medicine has been sent in and/or is ready for pickup.

## 2023-06-10 NOTE — Telephone Encounter (Signed)
 Patient sent Mychart message regarding prescription refill being sent.

## 2023-06-27 ENCOUNTER — Other Ambulatory Visit: Payer: Self-pay | Admitting: Family Medicine

## 2023-06-27 NOTE — Telephone Encounter (Signed)
 Copied from CRM [id number]. Topic: Clinical - Medication Refill >> Jun 27, 2023  2:07 PM Geroge Baseman wrote: Most Recent Primary Care Visit:  Provider: Charlton Amor  Department: [facility]  Visit Type: ACUTE  Date: 05/12/2023  Medication: traMADol (ULTRAM) 50 MG tablet  Has the patient contacted their pharmacy? No (Agent: If no, request that the patient contact the pharmacy for the refill. If patient does not wish to contact the pharmacy document the reason why and proceed with request.) (Agent: If yes, when and what did the pharmacy advise?)  Is this the correct pharmacy for this prescription? Yes If no, delete pharmacy and type the correct one.  This is the patient's preferred pharmacy:  [id number] - [place], [place] - [address] AT [facility] [address] [place] [address] Phone: [phone] Fax: [phone]   Has the prescription been filled recently? Yes  Is the patient out of the medication? No  Has the patient been seen for an appointment in the last year OR does the patient have an upcoming appointment? Yes  Can we respond through MyChart? Yes  Agent: Please be advised that Rx refills may take up to 3 business days. We ask that you follow-up with your pharmacy.

## 2023-06-27 NOTE — Telephone Encounter (Unsigned)
 Copied from CRM [id number]. Topic: Clinical - Medication Refill >> Jun 27, 2023  2:07 PM Geroge Baseman wrote: Most Recent Primary Care Visit:  Provider: Charlton Amor  Department: [facility]  Visit Type: ACUTE  Date: 05/12/2023  Medication: traMADol (ULTRAM) 50 MG tablet  Has the patient contacted their pharmacy? No (Agent: If no, request that the patient contact the pharmacy for the refill. If patient does not wish to contact the pharmacy document the reason why and proceed with request.) (Agent: If yes, when and what did the pharmacy advise?)  Is this the correct pharmacy for this prescription? Yes If no, delete pharmacy and type the correct one.  This is the patient's preferred pharmacy:  [id number] - [place], [place] - [address] AT [facility] [address] [place] [address] Phone: [phone] Fax: [phone]   Has the prescription been filled recently? Yes  Is the patient out of the medication? No  Has the patient been seen for an appointment in the last year OR does the patient have an upcoming appointment? Yes  Can we respond through MyChart? Yes  Agent: Please be advised that Rx refills may take up to 3 business days. We ask that you follow-up with your pharmacy.

## 2023-06-29 ENCOUNTER — Other Ambulatory Visit: Payer: Self-pay | Admitting: Medical-Surgical

## 2023-06-30 ENCOUNTER — Ambulatory Visit: Payer: Self-pay

## 2023-06-30 NOTE — Telephone Encounter (Signed)
 Copied from CRM [id number]. Topic: Clinical - Red Word Triage >> Jun 30, 2023  2:07 PM Clide Dales wrote: Red Word that prompted transfer to Nurse Triage: stomach pain  Sign Language Interpreter [phone]  Chief Complaint: abdominal pain Symptoms: upper abdominal pain  Frequency: ongoing, intermittent Pertinent Negatives: Patient denies lower abdominal pain, current severe pain, chest pain, SOB, sweating, nausea, back pain, diarrhea, fever, urination pain, vomiting Disposition: [] 911 / [] ED /[] Urgent Care (no appt availability in office) / [] Appointment(In office/virtual)/ []  [place] [facility]/ [] Home Care/ [] Refused Recommended Disposition /[] [place] [facility]/ [x]  Follow-up with PCP Additional Notes: Pt reporting that her recent refill request for tramadol was denied, pt reporting that she has an appt to establish care with GI on 4/14 with no earlier availability and pt needs a script for tramadol to hold pt over until able to have GI assume care and pain management. Pt reporting that she has moderate pain that gets more intense and "awful" every time she eats, pt tramadol only comes as 12 pills but pt is needing to take 1 pill every day in order to eat. Pt confirms she is eating lightly with foods that are light on the stomach but "every time I eat, I am in pain." Advised pt that since PRN med, likely requested med too soon. Pt requesting refill be reconsidered. Advised pt call GI to see about earlier appt, also go to hospital if severe pain, otherwise sending HP message to request that pt receive call back about options with pain management and this med, advised may need another appt with PCP. Pt verbalized understanding. Nurse also requesting that provider try to arrange earlier GI appt for pt if at all possible.  Reason for Disposition  [1] Prescription refill request for ESSENTIAL medicine (i.e., likelihood of harm to patient if not taken) AND [2] triager unable to refill per department  policy  Answer Assessment - Initial Assessment Questions 1. LOCATION: "Where does it hurt?"      Upper abdominal area, pancreas area, upper GI 3. ONSET: "When did the pain begin?" (e.g., minutes, hours or days ago)      ongoing 4. SUDDEN: "Gradual or sudden onset?"     Ongoing issue 5. PATTERN "Does the pain come and go, or is it constant?"    - If it comes and goes: "How long does it last?" "Do you have pain now?"     (Note: Comes and goes means the pain is intermittent. It goes away completely between bouts.)    - If constant: "Is it getting better, staying the same, or getting worse?"      (Note: Constant means the pain never goes away completely; most serious pain is constant and gets worse.)      Comes and goes, when don't eat it's fine, but when eat hurts really really bad 6. SEVERITY: "How bad is the pain?"  (e.g., Scale 1-10; mild, moderate, or severe)    - MILD (1-3): Doesn't interfere with normal activities, abdomen soft and not tender to touch..     - MODERATE (4-7): Interferes with normal activities or awakens from sleep, abdomen tender to touch.     - SEVERE (8-10): Excruciating pain, doubled over, unable to do any normal activities.       Moderate because haven't eaten yet, when I eat then have to eat lightly, not doubled over in pain when eat but like oh wow does it hurt it's pretty awful 7. RECURRENT SYMPTOM: "Have you ever had  this type of stomach pain before?" If Yes, ask: "When was the last time?" and "What happened that time?"      yes 8. AGGRAVATING FACTORS: "Does anything seem to cause this pain?" (e.g., foods, stress, alcohol)     foods 9. CARDIAC SYMPTOMS: "Do you have any of the following symptoms: chest pain, difficulty breathing, sweating, nausea?"     denies 10. OTHER SYMPTOMS: "Do you have any other symptoms?" (e.g., back pain, diarrhea, fever, urination pain, vomiting)       denies 11. PREGNANCY: "Is there any chance you are pregnant?" "When was your last  menstrual period?"       no  Answer Assessment - Initial Assessment Questions 1. DRUG NAME: "What medicine do you need to have refilled?"     tramadol 2. REFILLS REMAINING: "How many refills are remaining?" (Note: The label on the medicine or pill bottle will show how many refills are remaining. If there are no refills remaining, then a renewal may be needed.)     None, no pills left 4. PRESCRIBING HCP: "Who prescribed it?" Reason: If prescribed by specialist, call should be referred to that group.     PCP, set to see GI in April  Protocols used: Abdominal Pain - Upper-A-AH, Medication Refill and Renewal Call-A-AH

## 2023-07-01 ENCOUNTER — Other Ambulatory Visit: Payer: Self-pay | Admitting: Family Medicine

## 2023-07-01 MED ORDER — TRAMADOL HCL 50 MG PO TABS: 50.0000 mg | ORAL_TABLET | Freq: Four times a day (QID) | ORAL | 0 refills | Status: AC | PRN

## 2023-07-01 NOTE — Telephone Encounter (Signed)
 Pt notified via My Chart

## 2023-07-05 ENCOUNTER — Ambulatory Visit
Admission: EM | Admit: 2023-07-05 | Discharge: 2023-07-05 | Disposition: A | Discharge status: Home / Self Care | Attending: Internal Medicine | Admitting: Internal Medicine

## 2023-07-05 ENCOUNTER — Other Ambulatory Visit: Payer: Self-pay

## 2023-07-05 DIAGNOSIS — J3089 Other allergic rhinitis: Secondary | ICD-10-CM

## 2023-07-05 DIAGNOSIS — G4489 Other headache syndrome: Secondary | ICD-10-CM

## 2023-07-05 LAB — POC COVID19/FLU A&B COMBO
Covid Antigen, POC: NEGATIVE
Influenza A Antigen, POC: NEGATIVE
Influenza B Antigen, POC: NEGATIVE

## 2023-07-05 MED ORDER — KETOROLAC TROMETHAMINE 10 MG PO TABS: 10.0000 mg | ORAL_TABLET | Freq: Four times a day (QID) | ORAL | 0 refills | Status: AC | PRN

## 2023-07-05 MED ORDER — FLUTICASONE PROPIONATE 50 MCG/ACT NA SUSP: 2.0000 | Freq: Every day | NASAL | 0 refills | Status: AC

## 2023-07-05 MED ORDER — PSEUDOEPHEDRINE HCL 30 MG PO TABS: 30.0000 mg | ORAL_TABLET | ORAL | 0 refills | Status: AC | PRN

## 2023-07-05 NOTE — Discharge Instructions (Addendum)
 Your covid and flu test are negative, but if you get worse you may need to be re-tested I am prescribing something for the stuffy nose and Headache You may have a virus called Rhinovirus

## 2023-07-05 NOTE — ED Triage Notes (Signed)
 C/O headache and sinus pressure and feeling hot and cold. Onset yesterday. Denies fever and sore throat. Patient states feeling weak and tired.

## 2023-07-05 NOTE — ED Notes (Signed)
 Triage assisted with ASL via monitor Sue Lush)

## 2023-07-05 NOTE — ED Provider Notes (Signed)
 [facility]    CSN: [id number] Arrival date & time: 07/05/23  1156      History   Chief Complaint Chief Complaint  Patient presents with   Headache    HPI Diana Torres is a 33 y.o. female.  around 3-4 pm had HA and felt fatigued, could not take a nap. Had a lot of rhinitis and post nasal drainage, and her nose is stuffy. Has sinus pressure on forehead and face. Last night had chills, hot and cold. Denies sneezing.  She denies history of headaches.  Denies hx of sinus surgeries.  She took Zyrtec which did not help, Tylenol did not help either. She denies sneezing or body aches.  She did not check her temp last night.  Denies cough or ST.  She states she has loss of taste.  Denies any GI symptoms.      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   with pregnancy   Mass    cyst/left upper abd quad   Miscarriage    pt states had difficulty with high BP prior to miscarriage and afterwards till infection was healed   PONV (postoperative nausea and vomiting)    UTI (lower urinary tract infection)     Patient Active Problem List   Diagnosis Date Noted   Generalized abdominal pain 05/12/2023   Routine adult health maintenance 01/19/2023   Epigastric abdominal pain 01/05/2023   Immunity to measles, mumps, and rubella determined by serologic test 06/15/2022   Immunity status testing 06/15/2022   Routine health maintenance 12/10/2021   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   Hearing impaired 08/14/2012    Past Surgical History:  Procedure Laterality Date   COCHLEAR IMPLANT     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   Procedure: LAPAROSCOPIC SPLEEN SPARING AND DISTAL PANCRETECTOMY ;  Surgeon: Almond Lint, MD;  Location: [facility];  Service: General;  Laterality: N/A;   LAPAROSCOPIC TUBAL LIGATION Bilateral 07/08/2016   Procedure: LAPAROSCOPIC TUBAL LIGATION WITH FILSHIE CLIPS;  Surgeon: Tereso Newcomer, MD;   Location: [facility];  Service: Gynecology;  Laterality: Bilateral;   RHINOPLASTY     right foot  2016   cyst removed   TONSILLECTOMY      OB History     Gravida  3   Para  2   Term  2   Preterm      AB  1   Living  2      SAB  1   IAB      Ectopic      Multiple  0   Live Births  2            Home Medications    Prior to Admission medications   Medication Sig Start Date End Date Taking? Authorizing Provider  fluticasone (FLONASE) 50 MCG/ACT nasal spray Place 2 sprays into both nostrils daily. 07/05/23  Yes Rodriguez-Southworth, Nettie Elm, PA-C  ketorolac (TORADOL) 10 MG tablet Take 1 tablet (10 mg total) by mouth every 6 (six) hours as needed. 07/05/23  Yes Rodriguez-Southworth, Nettie Elm, PA-C  pseudoephedrine (SUDAFED) 30 MG tablet Take 1 tablet (30 mg total) by mouth every 4 (four) hours as needed for congestion. 07/05/23  Yes Rodriguez-Southworth, Nettie Elm, PA-C  traMADol (ULTRAM) 50 MG tablet Take 1 tablet (50 mg total) by mouth every 6 (six) hours as needed. 07/01/23   Everrett Coombe, DO  hyoscyamine (LEVSIN SL) 0.125 MG SL tablet  Place 1 tablet (0.125 mg total) under the tongue every 6 (six) hours as needed. Patient not taking: Reported on 04/12/2018 08/08/17 03/19/19  Sherrilyn Rist, MD  methscopolamine (PAMINE) 2.5 MG TABS tablet Take 1 tablet (2.5 mg total) by mouth 2 (two) times daily as needed. Patient not taking: Reported on 04/12/2018 09/23/17 03/19/19  Sherrilyn Rist, MD    Family History Family History  Problem Relation Age of Onset   Hearing loss Mother    Hypertension Mother    Alcohol abuse Father    Hearing loss Father    Pancreatic cancer Maternal Grandmother     Social History Social History   Tobacco Use   Smoking status: Never   Smokeless tobacco: Never  Vaping Use   Vaping status: Never Used  Substance Use Topics   Alcohol use: Yes    Comment: Social   Drug use: No     Allergies   Codeine   Review of Systems Review of  Systems  As noted in HPI Physical Exam Triage Vital Signs ED Triage Vitals [07/05/23 1211]  Encounter Vitals Group     BP      Systolic BP Percentile      Diastolic BP Percentile      Pulse      Resp      Temp      Temp src      SpO2      Weight      Height      Head Circumference      Peak Flow      Pain Score 8     Pain Loc      Pain Education      Exclude from Growth Chart    No data found.  Updated Vital Signs LMP 06/28/2023   Visual Acuity Right Eye Distance:   Left Eye Distance:   Bilateral Distance:    Right Eye Near:   Left Eye Near:    Bilateral Near:     Physical Exam Physical Exam Vitals signs and nursing note reviewed.  Constitutional:      General: She is not in acute distress.    Appearance: Normal appearance. She is not ill-appearing, toxic-appearing or diaphoretic.  HENT:     Head: Normocephalic.     Right Ear: Tympanic membrane, ear canal and external ear normal.     Left Ear: Tympanic membrane, ear canal and external ear normal.     Nose: pink pale with moderate mucosa swelling. Mucous is clear.     Mouth/Throat: clear    Mouth: Mucous membranes are moist.  Eyes:     General: No scleral icterus.       Right eye: No discharge.        Left eye: No discharge.     Conjunctiva/sclera: Conjunctivae normal.  Neck:     Musculoskeletal: Neck supple. No neck rigidity.  Cardiovascular:     Rate and Rhythm: Normal rate and regular rhythm.     Heart sounds: No murmur.  Pulmonary:     Effort: Pulmonary effort is normal.     Breath sounds: Normal breath sounds.  Musculoskeletal: Normal range of motion.  Lymphadenopathy:     Cervical: No cervical adenopathy.  Skin:    General: Skin is warm and dry.     Coloration: Skin is not jaundiced.     Findings: No rash.  Neurological:     Mental Status: She is alert and oriented to person, place,  and time.     Gait: Gait normal.  Psychiatric:        Mood and Affect: Mood normal.        Behavior:  Behavior normal.        Thought Content: Thought content normal.        Judgment: Judgment normal.    UC Treatments / Results  Labs (all labs ordered are listed, but only abnormal results are displayed) Labs Reviewed  POC COVID19/FLU A&B COMBO   Flu and Covid tests are negative EKG   Radiology No results found.  Procedures Procedures (including critical care time)  Medications Ordered in UC Medications - No data to display  Initial Impression / Assessment and Plan / UC Course  I have reviewed the triage vital signs and the nursing notes.  Pertinent labs  results that were available during my care of the patient were reviewed by me and considered in my medical decision making (see chart for details).  Rhinitis HA Sinus HA  She was educated she does not have bacterial sinusitis, but sinus inflammation from the rhinitis.  I placed her on Sudafed, Flonase, and Toradol as noted See instructions.    Final Clinical Impressions(s) / UC Diagnoses   Final diagnoses:  Non-seasonal allergic rhinitis, unspecified trigger  Other headache syndrome     Discharge Instructions      Your covid and flu test are negative, but if you get worse you may need to be re-tested I am prescribing something for the stuffy nose and Headache You may have a virus called Rhinovirus      ED Prescriptions     Medication Sig Dispense Auth. Provider   ketorolac (TORADOL) 10 MG tablet Take 1 tablet (10 mg total) by mouth every 6 (six) hours as needed. 15 tablet Rodriguez-Southworth, Nettie Elm, PA-C   pseudoephedrine (SUDAFED) 30 MG tablet Take 1 tablet (30 mg total) by mouth every 4 (four) hours as needed for congestion. 30 tablet Rodriguez-Southworth, Kayin Kettering, PA-C   fluticasone (FLONASE) 50 MCG/ACT nasal spray Place 2 sprays into both nostrils daily. 16 g Rodriguez-Southworth, Nettie Elm, PA-C      PDMP not reviewed this encounter.   Garey Ham, [place] 07/05/23 1349

## 2023-07-18 ENCOUNTER — Ambulatory Visit: Payer: Medicaid Other | Admitting: Gastroenterology

## 2023-08-11 ENCOUNTER — Encounter: Payer: Self-pay | Admitting: Family Medicine

## 2023-12-07 DIAGNOSIS — R110 Nausea: Secondary | ICD-10-CM | POA: Diagnosis not present

## 2023-12-07 DIAGNOSIS — U071 COVID-19: Secondary | ICD-10-CM | POA: Diagnosis not present

## 2024-02-14 DIAGNOSIS — N3091 Cystitis, unspecified with hematuria: Secondary | ICD-10-CM | POA: Diagnosis not present

## 2024-02-14 DIAGNOSIS — N760 Acute vaginitis: Secondary | ICD-10-CM | POA: Diagnosis not present

## 2024-03-15 ENCOUNTER — Telehealth: Payer: Self-pay | Admitting: *Deleted

## 2024-03-15 NOTE — Telephone Encounter (Signed)
 Returned call from 2:24 PM. Left patient a message with sign language interpreter to call the office to schedule with Dr. Cleatus.

## 2024-04-04 ENCOUNTER — Ambulatory Visit: Admitting: Obstetrics and Gynecology
